# Patient Record
Sex: Male | Born: 1950 | Race: White | Hispanic: No | Smoking: Former smoker
Health system: Southern US, Community
[De-identification: ages and names within clinical notes are randomized; demographics above are authoritative.]

## PROBLEM LIST (undated history)

## (undated) DIAGNOSIS — E079 Disorder of thyroid, unspecified: Secondary | ICD-10-CM

## (undated) DIAGNOSIS — L409 Psoriasis, unspecified: Secondary | ICD-10-CM

## (undated) DIAGNOSIS — I639 Cerebral infarction, unspecified: Secondary | ICD-10-CM

## (undated) DIAGNOSIS — J690 Pneumonitis due to inhalation of food and vomit: Secondary | ICD-10-CM

## (undated) DIAGNOSIS — E7800 Pure hypercholesterolemia, unspecified: Secondary | ICD-10-CM

## (undated) DIAGNOSIS — R1310 Dysphagia, unspecified: Secondary | ICD-10-CM

## (undated) DIAGNOSIS — S065XAA Traumatic subdural hemorrhage with loss of consciousness status unknown, initial encounter: Secondary | ICD-10-CM

## (undated) DIAGNOSIS — F319 Bipolar disorder, unspecified: Secondary | ICD-10-CM

## (undated) DIAGNOSIS — K5900 Constipation, unspecified: Secondary | ICD-10-CM

## (undated) DIAGNOSIS — S065X9A Traumatic subdural hemorrhage with loss of consciousness of unspecified duration, initial encounter: Secondary | ICD-10-CM

## (undated) HISTORY — DX: Constipation, unspecified: K59.00

## (undated) HISTORY — PX: WRIST FRACTURE SURGERY: SHX121

## (undated) HISTORY — DX: Psoriasis, unspecified: L40.9

## (undated) HISTORY — DX: Pure hypercholesterolemia, unspecified: E78.00

---

## 2003-08-23 HISTORY — PX: FOOT SURGERY: SHX648

## 2011-08-27 ENCOUNTER — Emergency Department (HOSPITAL_COMMUNITY)
Admission: EM | Admit: 2011-08-27 | Discharge: 2011-08-27 | Disposition: A | Discharge status: Home / Self Care | Payer: Medicaid Other | Source: Home / Self Care | Attending: Emergency Medicine | Admitting: Emergency Medicine

## 2011-08-27 ENCOUNTER — Encounter (HOSPITAL_COMMUNITY): Payer: Self-pay | Admitting: *Deleted

## 2011-08-27 DIAGNOSIS — H109 Unspecified conjunctivitis: Secondary | ICD-10-CM

## 2011-08-27 MED ORDER — CIPROFLOXACIN HCL 0.3 % OP SOLN: OPHTHALMIC | Status: DC

## 2011-08-27 NOTE — ED Provider Notes (Signed)
Chief Complaint  Patient presents with  . Eye Problem    History of Present Illness:   Ricky Ward is a 61 year old male who has a history of a red, draining the right eye for the past 4 days. There is no history of injury to the eye and he does not think he got anything in the eye. The eye is somewhat irritated feeling, a little itchy, but not painful, and these had no photophobia or no vision change. He had some yellowish drainage. There is also some swelling of his upper lid. He denies any fever, chills, sore throat, adenopathy, or cough.  Review of Systems:  Other than noted above, the patient denies any of the following symptoms: Systemic:  No fever, chills, sweats, fatigue, or weight loss. Eye:  No redness, eye pain, photophobia, discharge, blurred vision, or diplopia. ENT:  No nasal congestion, rhinorrhea, or sore throat. Lymphatic:  No adenopathy. Skin:  No rash or pruritis.  PMFSH:  Past medical history, family history, social history, meds, and allergies were reviewed.  Physical Exam:   Vital signs:  BP 135/80  Pulse 78  Temp 98.6 F (37 C) (Oral)  Resp 16  SpO2 100% General:  Alert and in no distress. Eye:  His upper and lower eyelid of his right eye was slightly swollen and erythematous. Periorbital tissues are normal. The conjunctiva was red and injected and there was some mucoid drainage. There was no evidence of foreign body. The cornea was intact to fluorescein staining, anterior chamber was normal, PERRLA, full visual fields, full EOMs. Fundi were not well seen because of small and poorly reactive pupils. ENT:  TMs and canals clear.  Nasal mucosa normal.  No intra-oral lesions, mucous membranes moist, pharynx clear. Neck:  No adenopathy tenderness or mass. Skin:  Clear, warm and dry.  Assessment:  The encounter diagnosis was Conjunctivitis.  Plan:   1.  The following meds were prescribed:   New Prescriptions   CIPROFLOXACIN (CILOXAN) 0.3 % OPHTHALMIC SOLUTION    1 drop in  right eye every 3 hours while awake.   2.  The patient was instructed in symptomatic care and handouts were given. 3.  The patient was told to return if becoming worse in any way, if no better in 3 or 4 days, and given some red flag symptoms that would indicate earlier return.     Reuben Likes, MD 08/27/11 [phone]

## 2011-08-27 NOTE — ED Notes (Signed)
Pt  R  Eye  Is  Red  Irritated  And  Watering       X  Several  Days       She      Reports  Poss  fb  In the  Affected  Eye  -  Pt  Is  Quite  Animated  And  Vocal  -        He  denys  Any  Other  Symptoms  At this  Time

## 2013-04-15 ENCOUNTER — Encounter (HOSPITAL_COMMUNITY): Payer: Self-pay | Admitting: Emergency Medicine

## 2013-04-15 ENCOUNTER — Emergency Department (HOSPITAL_COMMUNITY)
Admission: EM | Admit: 2013-04-15 | Discharge: 2013-04-16 | Disposition: A | Discharge status: Home / Self Care | Payer: Medicaid Other | Attending: Emergency Medicine | Admitting: Emergency Medicine

## 2013-04-15 DIAGNOSIS — F29 Unspecified psychosis not due to a substance or known physiological condition: Secondary | ICD-10-CM | POA: Insufficient documentation

## 2013-04-15 DIAGNOSIS — Z008 Encounter for other general examination: Secondary | ICD-10-CM | POA: Diagnosis present

## 2013-04-15 DIAGNOSIS — R221 Localized swelling, mass and lump, neck: Secondary | ICD-10-CM

## 2013-04-15 DIAGNOSIS — Z880 Allergy status to penicillin: Secondary | ICD-10-CM | POA: Insufficient documentation

## 2013-04-15 DIAGNOSIS — R220 Localized swelling, mass and lump, head: Secondary | ICD-10-CM | POA: Diagnosis not present

## 2013-04-15 HISTORY — DX: Bipolar disorder, unspecified: F31.9

## 2013-04-15 LAB — CBC WITH DIFFERENTIAL/PLATELET
BASOS ABS: 0.1 10*3/uL (ref 0.0–0.1)
Basophils Relative: 1 % (ref 0–1)
EOS ABS: 0.2 10*3/uL (ref 0.0–0.7)
EOS PCT: 3 % (ref 0–5)
HCT: 37.9 % — ABNORMAL LOW (ref 39.0–52.0)
Hemoglobin: 13.1 g/dL (ref 13.0–17.0)
LYMPHS ABS: 1.4 10*3/uL (ref 0.7–4.0)
Lymphocytes Relative: 24 % (ref 12–46)
MCH: 32.7 pg (ref 26.0–34.0)
MCHC: 34.6 g/dL (ref 30.0–36.0)
MCV: 94.5 fL (ref 78.0–100.0)
Monocytes Absolute: 0.5 10*3/uL (ref 0.1–1.0)
Monocytes Relative: 8 % (ref 3–12)
NEUTROS PCT: 64 % (ref 43–77)
Neutro Abs: 3.8 10*3/uL (ref 1.7–7.7)
PLATELETS: 256 10*3/uL (ref 150–400)
RBC: 4.01 MIL/uL — AB (ref 4.22–5.81)
RDW: 13.7 % (ref 11.5–15.5)
WBC: 5.9 10*3/uL (ref 4.0–10.5)

## 2013-04-15 LAB — COMPREHENSIVE METABOLIC PANEL
ALBUMIN: 4.5 g/dL (ref 3.5–5.2)
ALK PHOS: 58 U/L (ref 39–117)
ALT: 45 U/L (ref 0–53)
AST: 65 U/L — AB (ref 0–37)
BUN: 11 mg/dL (ref 6–23)
CALCIUM: 9.7 mg/dL (ref 8.4–10.5)
CO2: 28 mEq/L (ref 19–32)
Chloride: 98 mEq/L (ref 96–112)
Creatinine, Ser: 1.01 mg/dL (ref 0.50–1.35)
GFR calc Af Amer: 89 mL/min — ABNORMAL LOW (ref >90)
GFR calc non Af Amer: 77 mL/min — ABNORMAL LOW (ref >90)
GLUCOSE: 127 mg/dL — AB (ref 70–99)
POTASSIUM: 3.9 meq/L (ref 3.7–5.3)
SODIUM: 139 meq/L (ref 137–147)
TOTAL PROTEIN: 8.7 g/dL — AB (ref 6.0–8.3)
Total Bilirubin: 0.5 mg/dL (ref 0.3–1.2)

## 2013-04-15 LAB — RAPID URINE DRUG SCREEN, HOSP PERFORMED
AMPHETAMINES: NOT DETECTED
BARBITURATES: NOT DETECTED
BENZODIAZEPINES: NOT DETECTED
COCAINE: NOT DETECTED
Opiates: NOT DETECTED
TETRAHYDROCANNABINOL: NOT DETECTED

## 2013-04-15 LAB — URINALYSIS, ROUTINE W REFLEX MICROSCOPIC
BILIRUBIN URINE: NEGATIVE
Glucose, UA: NEGATIVE mg/dL
HGB URINE DIPSTICK: NEGATIVE
KETONES UR: NEGATIVE mg/dL
Leukocytes, UA: NEGATIVE
NITRITE: NEGATIVE
PROTEIN: NEGATIVE mg/dL
SPECIFIC GRAVITY, URINE: 1.015 (ref 1.005–1.030)
UROBILINOGEN UA: 1 mg/dL (ref 0.0–1.0)
pH: 7.5 (ref 5.0–8.0)

## 2013-04-15 LAB — AMMONIA: AMMONIA: 16 umol/L (ref 11–60)

## 2013-04-15 LAB — ETHANOL: Alcohol, Ethyl (B): <11 mg/dL (ref 0–11)

## 2013-04-15 NOTE — ED Notes (Signed)
Pt arrives from [place] w/ security guard; pt was brought to [facility] after calling [employer] this morning to express suicidal thoughts, pt desired to have officer shoot him as his SI plan; pt is int he ER for medical clearance and may return to [facility] after medical screening and evaluation of bump to face; pt with large "bump" to rt side of face; pt states that he has had that for 2+ yrs

## 2013-04-15 NOTE — ED Provider Notes (Signed)
CSN: [id number]     Arrival date & time 04/15/13  2023 History  This chart was scribed for non-physician practitioner, Charlann Lange, PA-C,working with Richarda Blade, MD, by Marlowe Kays, ED Scribe.  This patient was seen in room WTR3/WLPT3 and the patient's care was started at 8:58 PM.  No chief complaint on file.  The history is provided by the patient. No language interpreter was used.   HPI Comments:  Ricky Ward is a 63 y.o. male who presents to the Emergency Department needing a medical clearance. Pt states he is here from [facility] for "testing". Pt states he has been eating and drinking normally. Pt denies any pain.   No past medical history on file. Past Surgical History  Procedure Laterality Date  . Foot surgery     No family history on file. History  Substance Use Topics  . Smoking status: Not on file  . Smokeless tobacco: Not on file  . Alcohol Use:     Review of Systems  All other systems reviewed and are negative.   Allergies  Penicillins  Home Medications  No current outpatient prescriptions on file. Triage Vitals: BP 140/77  Pulse 81  Temp(Src) 98.1 F (36.7 C) (Oral)  Resp 18  SpO2 100% Physical Exam  Nursing note and vitals reviewed. Constitutional: He is oriented to person, place, and time. He appears well-developed and well-nourished.  HENT:  Head: Normocephalic and atraumatic.  Right sided facial swelling over mandibular condyle. Appears nontender and chronic.   Eyes: EOM are normal.  Neck: Normal range of motion.  Cardiovascular: Normal rate and regular rhythm.  Exam reveals no gallop and no friction rub.   No murmur heard. Pulmonary/Chest: Effort normal and breath sounds normal. No respiratory distress. He has no wheezes. He has no rales. He exhibits no tenderness.  Abdominal: Soft. He exhibits no distension and no mass. There is no tenderness. There is no rebound and no guarding.  Musculoskeletal: Normal range of motion.  Neurological: He  is alert and oriented to person, place, and time.  Skin: Skin is warm and dry.  Psychiatric: He has a normal mood and affect. His behavior is normal.    ED Course  Procedures (including critical care time) DIAGNOSTIC STUDIES: Oxygen Saturation is 100% on RA, normal by my interpretation.   COORDINATION OF CARE: 9:03 PM- Will order standard medical clearance labs. Pt verbalizes understanding and agrees to plan.  Medications - No data to display  Labs Review Labs Reviewed  CBC WITH DIFFERENTIAL - Abnormal; Notable for the following:    RBC 4.01 (*)    HCT 37.9 (*)    All other components within normal limits  COMPREHENSIVE METABOLIC PANEL - Abnormal; Notable for the following:    Glucose, Bld 127 (*)    Total Protein 8.7 (*)    AST 65 (*)    GFR calc non Af Amer 77 (*)    GFR calc Af Amer 89 (*)    All other components within normal limits  ETHANOL  URINE RAPID DRUG SCREEN (HOSP PERFORMED)  AMMONIA  URINALYSIS, ROUTINE W REFLEX MICROSCOPIC   Results for orders placed during the hospital encounter of 04/15/13  CBC WITH DIFFERENTIAL      Result Value Ref Range   WBC 5.9  4.0 - 10.5 K/uL   RBC 4.01 (*) 4.22 - 5.81 MIL/uL   Hemoglobin 13.1  13.0 - 17.0 g/dL   HCT 37.9 (*) 39.0 - 52.0 %   MCV 94.5  78.0 -  100.0 fL   MCH 32.7  26.0 - 34.0 pg   MCHC 34.6  30.0 - 36.0 g/dL   RDW 13.7  11.5 - 15.5 %   Platelets 256  150 - 400 K/uL   Neutrophils Relative % 64  43 - 77 %   Neutro Abs 3.8  1.7 - 7.7 K/uL   Lymphocytes Relative 24  12 - 46 %   Lymphs Abs 1.4  0.7 - 4.0 K/uL   Monocytes Relative 8  3 - 12 %   Monocytes Absolute 0.5  0.1 - 1.0 K/uL   Eosinophils Relative 3  0 - 5 %   Eosinophils Absolute 0.2  0.0 - 0.7 K/uL   Basophils Relative 1  0 - 1 %   Basophils Absolute 0.1  0.0 - 0.1 K/uL  COMPREHENSIVE METABOLIC PANEL      Result Value Ref Range   Sodium 139  137 - 147 mEq/L   Potassium 3.9  3.7 - 5.3 mEq/L   Chloride 98  96 - 112 mEq/L   CO2 28  19 - 32 mEq/L    Glucose, Bld 127 (*) 70 - 99 mg/dL   BUN 11  6 - 23 mg/dL   Creatinine, Ser 1.01  0.50 - 1.35 mg/dL   Calcium 9.7  8.4 - 10.5 mg/dL   Total Protein 8.7 (*) 6.0 - 8.3 g/dL   Albumin 4.5  3.5 - 5.2 g/dL   AST 65 (*) 0 - 37 U/L   ALT 45  0 - 53 U/L   Alkaline Phosphatase 58  39 - 117 U/L   Total Bilirubin 0.5  0.3 - 1.2 mg/dL   GFR calc non Af Amer 77 (*) >90 mL/min   GFR calc Af Amer 89 (*) >90 mL/min  ETHANOL      Result Value Ref Range   Alcohol, Ethyl (B) <11  0 - 11 mg/dL  URINE RAPID DRUG SCREEN (HOSP PERFORMED)      Result Value Ref Range   Opiates NONE DETECTED  NONE DETECTED   Cocaine NONE DETECTED  NONE DETECTED   Benzodiazepines NONE DETECTED  NONE DETECTED   Amphetamines NONE DETECTED  NONE DETECTED   Tetrahydrocannabinol NONE DETECTED  NONE DETECTED   Barbiturates NONE DETECTED  NONE DETECTED  AMMONIA      Result Value Ref Range   Ammonia 16  11 - 60 umol/L  URINALYSIS, ROUTINE W REFLEX MICROSCOPIC      Result Value Ref Range   Color, Urine YELLOW  YELLOW   APPearance CLEAR  CLEAR   Specific Gravity, Urine 1.015  1.005 - 1.030   pH 7.5  5.0 - 8.0   Glucose, UA NEGATIVE  NEGATIVE mg/dL   Hgb urine dipstick NEGATIVE  NEGATIVE   Bilirubin Urine NEGATIVE  NEGATIVE   Ketones, ur NEGATIVE  NEGATIVE mg/dL   Protein, ur NEGATIVE  NEGATIVE mg/dL   Urobilinogen, UA 1.0  0.0 - 1.0 mg/dL   Nitrite NEGATIVE  NEGATIVE   Leukocytes, UA NEGATIVE  NEGATIVE   Dg Chest 2 View  04/16/2013   CLINICAL DATA:  Medical clearance.  Nonsmoker.  EXAM: CHEST  2 VIEW  COMPARISON:  None.  FINDINGS: No infiltrate, congestive heart failure or pneumothorax. Cardiac leads overlie the left lower lung.  Chronic increased lung markings suspected.  Heart size within normal limits.  Calcified aorta.  IMPRESSION: No acute abnormality.  Please see above.   Electronically Signed   By: Chauncey Cruel M.D.   On:  04/16/2013 01:52   Imaging Review No results found.   EKG Interpretation None      MDM    Final diagnoses:  None    1. Psychosis  The patient has been cooperative here, hyperverbal. Medically cleared and accepted for return to [facility] for further treatment.   I personally performed the services described in this documentation, which was scribed in my presence. The recorded information has been reviewed and is accurate.    Dewaine Oats, PA-C 04/16/13 [phone]

## 2013-04-16 ENCOUNTER — Emergency Department (HOSPITAL_COMMUNITY): Payer: Medicaid Other

## 2013-04-16 NOTE — Discharge Instructions (Signed)
Psychosis Psychosis refers to a severe lack of understanding with reality. During a psychotic episode, you are not able to think clearly. During a psychotic episode, your responses and emotions are inappropriate and do not coincide with what is actually happening. You often have false beliefs about what is happening or who you are (delusions), and you may see, hear, taste, smell, or feel things that are not present (hallucinations). Psychosis is usually a severe symptom of a very serious mental health (psychiatric) condition, but it can sometimes be the result of a medical condition. CAUSES   Psychiatric conditions, such as:  Schizophrenia.  Bipolar disorder.  Depression.  Personality disorders.  Alcohol or drug abuse.  Medical conditions, such as:  Brain injury.  Brain tumor.  Dementia.  Brain diseases, such as Alzheimer's, Parkinson's, or Huntington's disease.  Neurological diseases, such as epilepsy.  Genetic disorders.  Metabolic disorders.  Infections that affect the brain.  Certain prescription drugs.  Stroke. SYMPTOMS   Unable to think or speak clearly or respond appropriately.  Disorganized thinking (thoughts jump from one thought to another).  Severe inappropriate behavior.  Delusions may include:  A strong belief that is odd, unrealistic, or false.  Feeling extremely fearful or suspicious (paranoid).  Believing you are someone else, have high importance, or have an altered identity.  Hallucinations. DIAGNOSIS   Mental health evaluation.  Physical exam.  Blood tests.  Computerized magnetic scan (MRI) or other brain scans. TREATMENT  Your caregiver will recommend a course of treatment that depends on the cause of the psychosis. Treatment may include:  Monitoring and supportive care in the hospital.  Taking medicines (antipsychotic medicine) to reduce symptoms and balance chemicals in the brain.  Taking medicines to manage underlying  mental health conditions.  Therapy and other supportive programs outside of the hospital.  Treating an underlying medical condition. If the cause of the psychosis can be treated or corrected, the outlook is good. Without treatment, psychotic episodes can cause danger to yourself or others. Treatment may be short-term or lifelong. HOME CARE INSTRUCTIONS   Take all medicines as directed. This is important.  Use a pillbox or write down your medicine schedule to make sure you are taking them.  Check with your caregiver before using over-the-counter medicines, herbs, or supplements.  Seek individual and family support through therapy and mental health education (psychoeducation) programs. These will help you manage symptoms and side effects of medicines, learn life skills, and maintain a healthy routine.  Maintain a healthy lifestyle.  Exercise regularly.  Avoid alcohol and drugs.  Learn ways to reduce stress and cope with stress, such as yoga and meditation.  Talk about your feelings with family members or caregivers.  Make time for yourself to do things you enjoy.  Know the early warning signs of psychosis. Your caregiver will recommend steps to take when you notice symptoms such as:  Feeling anxious or preoccupied.  Having racing thoughts.  Changes in your interest in life and relationships.  Follow up with your caregivers for continued outpatient treatment as directed. SEEK MEDICAL CARE IF:   Medicines do not seem to be helping.  You hear voices telling you to do things.  You see, smell, or feel things that are not there.  You feel hopeless and overwhelmed.  You feel extremely fearful and suspicious that something will harm you.  You feel like you cannot leave your house.  You have trouble taking care of yourself.  You experience side effects of medicines, such as  changes in sleep patterns, dizziness, weight gain, restlessness, movement changes, muscle spasms, or  tremors. °SEEK IMMEDIATE MEDICAL CARE IF:  °Severe psychotic symptoms present a safety issue (such as an urge to hurt yourself or others). °MAKE SURE YOU:  °· Understand these instructions. °· Will watch your condition. °· Will get help right away if you are not doing well or get worse. °FOR MORE INFORMATION  °National Institute of Mental Health: www.nimh.nih.gov °Document Released: 07/19/2009 Document Revised: 04/23/2011 Document Reviewed: 07/19/2009 °Exit[employer]® Patient Information ©2014 [employer]. ° °

## 2013-04-16 NOTE — ED Notes (Signed)
Spoke with [place], Ok to send pt back

## 2013-04-16 NOTE — ED Provider Notes (Signed)
Medical screening examination/treatment/procedure(s) were performed by non-physician practitioner and as supervising physician I was immediately available for consultation/collaboration.    Teressa Lower, MD 04/16/13 [phone]

## 2014-01-05 ENCOUNTER — Encounter (HOSPITAL_COMMUNITY): Payer: Self-pay | Admitting: Emergency Medicine

## 2014-01-05 ENCOUNTER — Emergency Department (INDEPENDENT_AMBULATORY_CARE_PROVIDER_SITE_OTHER)
Admission: EM | Admit: 2014-01-05 | Discharge: 2014-01-05 | Disposition: A | Discharge status: Home / Self Care | Payer: Medicare Other | Source: Home / Self Care | Attending: Family Medicine | Admitting: Family Medicine

## 2014-01-05 DIAGNOSIS — L723 Sebaceous cyst: Secondary | ICD-10-CM | POA: Diagnosis not present

## 2014-01-05 NOTE — ED Notes (Signed)
Reports possible abscess left side of face.  States "it's been there a couple of years".   Noticed pus draining on Saturday.

## 2014-01-05 NOTE — ED Provider Notes (Signed)
CSN: [id number]     Arrival date & time 01/05/14  1200 History   First MD Initiated Contact with Patient 01/05/14 1309     Chief Complaint  Patient presents with  . Facial Swelling    left side of face ? abscess   (Consider location/radiation/quality/duration/timing/severity/associated sxs/prior Treatment) Patient is a 63 y.o. male presenting with rash. The history is provided by the patient.  Rash Location:  Face Facial rash location:  L cheek Quality: draining and swelling   Severity:  Moderate Onset quality:  Gradual Duration: lesion present for 70yrs, drainage started on sat. Progression:  Worsening Chronicity:  Chronic Relieved by:  None tried Ineffective treatments:  None tried Associated symptoms: no fever     Past Medical History  Diagnosis Date  . Bipolar 1 disorder    Past Surgical History  Procedure Laterality Date  . Foot surgery     History reviewed. No pertinent family history. History  Substance Use Topics  . Smoking status: Never Smoker   . Smokeless tobacco: Not on file  . Alcohol Use: No    Review of Systems  Constitutional: Negative.  Negative for fever.  Musculoskeletal: Negative.   Skin: Positive for rash. Negative for wound.    Allergies  Penicillins  Home Medications   Prior to Admission medications   Medication Sig Start Date End Date Taking? Authorizing Provider  LEVOTHYROXINE SODIUM PO Take by mouth.   Yes Historical Provider, MD   BP 128/77 mmHg  Pulse 97  Temp(Src) 99 F (37.2 C) (Oral)  Resp 16  SpO2 97% Physical Exam  Constitutional: He is oriented to person, place, and time. He appears well-developed and well-nourished. No distress.  HENT:  Head: Normocephalic.  Right Ear: External ear normal.  Left Ear: External ear normal.  Mouth/Throat: Oropharynx is clear and moist.  Neck: Normal range of motion. Neck supple.  Lymphadenopathy:    He has no cervical adenopathy.  Neurological: He is alert and oriented to person,  place, and time.  Skin: Skin is warm and dry. No erythema.  2cm seb cyst to left cheek.  Nursing note and vitals reviewed.   ED Course  INCISION AND DRAINAGE Date/Time: 01/05/2014 2:08 PM Performed by: Ihor Gully D Authorized by: Ihor Gully D Consent: Verbal consent obtained. Risks and benefits: risks, benefits and alternatives were discussed Consent given by: patient Type: cyst Body area: head/neck Location details: face Local anesthetic: topical anesthetic Patient sedated: no Scalpel size: 15 Incision type: single straight Complexity: simple Drainage characteristics: thick sebaceous fluid expressed. Drainage amount: copious Wound treatment: wound left open Patient tolerance: Patient tolerated the procedure well with no immediate complications   (including critical care time) Labs Review Labs Reviewed - No data to display  Imaging Review No results found.   MDM   1. Inflamed sebaceous cyst        Billy Fischer, MD 01/05/14 1410

## 2014-01-05 NOTE — Discharge Instructions (Signed)
Bandage as needed, warm cloth twice a day, return if needed.

## 2014-02-23 DIAGNOSIS — R5382 Chronic fatigue, unspecified: Secondary | ICD-10-CM | POA: Diagnosis not present

## 2014-02-23 DIAGNOSIS — E039 Hypothyroidism, unspecified: Secondary | ICD-10-CM | POA: Diagnosis not present

## 2014-02-23 DIAGNOSIS — Z0000 Encounter for general adult medical examination without abnormal findings: Secondary | ICD-10-CM | POA: Diagnosis not present

## 2014-03-09 DIAGNOSIS — L03116 Cellulitis of left lower limb: Secondary | ICD-10-CM | POA: Diagnosis not present

## 2014-03-09 DIAGNOSIS — E039 Hypothyroidism, unspecified: Secondary | ICD-10-CM | POA: Diagnosis not present

## 2014-03-09 DIAGNOSIS — R600 Localized edema: Secondary | ICD-10-CM | POA: Diagnosis not present

## 2014-03-09 DIAGNOSIS — R5382 Chronic fatigue, unspecified: Secondary | ICD-10-CM | POA: Diagnosis not present

## 2014-03-20 ENCOUNTER — Emergency Department (INDEPENDENT_AMBULATORY_CARE_PROVIDER_SITE_OTHER)
Admission: EM | Admit: 2014-03-20 | Discharge: 2014-03-20 | Disposition: A | Discharge status: Home / Self Care | Payer: Medicare Other | Source: Home / Self Care | Attending: Emergency Medicine | Admitting: Emergency Medicine

## 2014-03-20 ENCOUNTER — Encounter (HOSPITAL_COMMUNITY): Payer: Self-pay | Admitting: *Deleted

## 2014-03-20 DIAGNOSIS — G4700 Insomnia, unspecified: Secondary | ICD-10-CM

## 2014-03-20 DIAGNOSIS — E038 Other specified hypothyroidism: Secondary | ICD-10-CM

## 2014-03-20 MED ORDER — ZOLPIDEM TARTRATE 5 MG PO TABS: 5.0000 mg | ORAL_TABLET | Freq: Every evening | ORAL | Status: DC | PRN

## 2014-03-20 NOTE — ED Notes (Signed)
C/o insomnia since 12/21.  Dr. Hanley Seamen him Trazadone, 1/12, Restoril 1/26 and 1/28 Melatonin ( but stopped this because it was upsetting his stomach.  Dozes off @ 0400 for and hour.

## 2014-03-20 NOTE — ED Provider Notes (Signed)
CSN: [id number]     Arrival date & time 03/20/14  1320 History   First MD Initiated Contact with Patient 03/20/14 1356     Chief Complaint  Patient presents with  . Insomnia   (Consider location/radiation/quality/duration/timing/severity/associated sxs/prior Treatment) HPI He is a 64 year old man here for evaluation of insomnia. He states he has been unable to sleep for more than an hour a night since December 21. He primarily reports difficulty falling asleep. He states he goes to bed between 10 PM and midnight. He does not watch television before bed. He does not take naps during the day. His doctor has tried him on Benadryl, trazodone, temazepam, melatonin. At this time, he is only taking the temazepam. He is also on levothyroxin. His dose was changed from 100 g to 75 g about 2 weeks ago.  Past Medical History  Diagnosis Date  . Bipolar 1 disorder    Past Surgical History  Procedure Laterality Date  . Foot surgery  08/23/2003   History reviewed. No pertinent family history. History  Substance Use Topics  . Smoking status: Never Smoker   . Smokeless tobacco: Not on file  . Alcohol Use: No    Review of Systems As in history of present illness Allergies  Penicillins  Home Medications   Prior to Admission medications   Medication Sig Start Date End Date Taking? Authorizing Provider  LEVOTHYROXINE SODIUM PO Take 0.075 mg by mouth daily.    Yes Historical Provider, MD  temazepam (RESTORIL) 7.5 MG capsule Take 7.5 mg by mouth at bedtime as needed for sleep. Not sure of dose.   Yes Historical Provider, MD  traZODone (DESYREL) 100 MG tablet Take 100 mg by mouth at bedtime. Not sure of the dose   Yes Historical Provider, MD  zolpidem (AMBIEN) 5 MG tablet Take 1 tablet (5 mg total) by mouth at bedtime as needed for sleep. 03/20/14   Melony Overly, MD   BP 132/80 mmHg  Pulse 75  Temp(Src) 97.9 F (36.6 C) (Oral)  Resp 18  SpO2 98% Physical Exam  Constitutional: He is oriented to  person, place, and time. He appears well-developed and well-nourished. No distress.  Cardiovascular: Normal rate.   Pulmonary/Chest: Effort normal.  Neurological: He is alert and oriented to person, place, and time.    ED Course  Procedures (including critical care time) Labs Review Labs Reviewed - No data to display  Imaging Review No results found.   MDM   1. Insomnia   2. Other specified hypothyroidism    Instructed him to stop the temazepam. We'll do a trial of Ambien. Instructed him to follow-up with his doctor next week. He should ask about his thyroid levels to make sure his thyroid medication is not contributing to his insomnia.    Melony Overly, MD 03/20/14 1435

## 2014-03-20 NOTE — Discharge Instructions (Signed)
Stopped taking the Restoril or temazepam. Start taking Ambien at bedtime. Follow-up with your doctor next week to see if the medication is helping. When you see your doctor, asked them about your thyroid levels.  If the levels are not normal, it can interrupt your sleep.

## 2014-03-27 ENCOUNTER — Emergency Department (INDEPENDENT_AMBULATORY_CARE_PROVIDER_SITE_OTHER)
Admission: EM | Admit: 2014-03-27 | Discharge: 2014-03-27 | Disposition: A | Discharge status: Home / Self Care | Payer: Medicare Other | Source: Home / Self Care | Attending: Family Medicine | Admitting: Family Medicine

## 2014-03-27 ENCOUNTER — Encounter (HOSPITAL_COMMUNITY): Payer: Self-pay

## 2014-03-27 DIAGNOSIS — B372 Candidiasis of skin and nail: Secondary | ICD-10-CM | POA: Diagnosis not present

## 2014-03-27 MED ORDER — CLOTRIMAZOLE-BETAMETHASONE 1-0.05 % EX CREA: TOPICAL_CREAM | CUTANEOUS | Status: DC

## 2014-03-27 NOTE — ED Provider Notes (Signed)
CSN: [id number]     Arrival date & time 03/27/14  1255 History   First MD Initiated Contact with Patient 03/27/14 1426     Chief Complaint  Patient presents with  . Skin Problem   (Consider location/radiation/quality/duration/timing/severity/associated sxs/prior Treatment) Patient is a 64 y.o. male presenting with rash. The history is provided by the patient.  Rash Location:  Mouth and ano-genital Mouth rash location:  Lower outer lip Ano-genital rash location:  Penis Quality: itchiness, peeling and redness   Severity:  Mild Duration:  6 hours Progression:  Unchanged Chronicity:  New Relieved by:  None tried Worsened by:  Nothing tried Ineffective treatments:  None tried Associated symptoms: no fever     Past Medical History  Diagnosis Date  . Bipolar 1 disorder    Past Surgical History  Procedure Laterality Date  . Foot surgery  08/23/2003   History reviewed. No pertinent family history. History  Substance Use Topics  . Smoking status: Never Smoker   . Smokeless tobacco: Not on file  . Alcohol Use: No    Review of Systems  Constitutional: Negative.  Negative for fever.  Genitourinary: Negative for discharge, penile swelling, penile pain and testicular pain.  Skin: Positive for rash.    Allergies  Penicillins  Home Medications   Prior to Admission medications   Medication Sig Start Date End Date Taking? Authorizing Provider  LEVOTHYROXINE SODIUM PO Take 0.075 mg by mouth daily.    Yes Historical Provider, MD  zolpidem (AMBIEN) 5 MG tablet Take 1 tablet (5 mg total) by mouth at bedtime as needed for sleep. 03/20/14  Yes Melony Overly, MD  clotrimazole-betamethasone (LOTRISONE) cream Apply to affected area 2 times daily prn 03/27/14   Billy Fischer, MD  temazepam (RESTORIL) 7.5 MG capsule Take 7.5 mg by mouth at bedtime as needed for sleep. Not sure of dose.    Historical Provider, MD  traZODone (DESYREL) 100 MG tablet Take 100 mg by mouth at bedtime. Not sure of the  dose    Historical Provider, MD   BP 137/65 mmHg  Pulse 62  Temp(Src) 97.9 F (36.6 C) (Oral)  SpO2 99% Physical Exam  Constitutional: He is oriented to person, place, and time. He appears well-developed and well-nourished. No distress.  HENT:  Lower lip dermatitis  Neck: Normal range of motion. Neck supple.  Genitourinary:  Rash under foreskin on glans, dry erythema.  Lymphadenopathy:    He has no cervical adenopathy.  Neurological: He is alert and oriented to person, place, and time.  Skin: Skin is warm and dry. Rash noted.  Nursing note and vitals reviewed.   ED Course  Procedures (including critical care time) Labs Review Labs Reviewed - No data to display  Imaging Review No results found.   MDM   1. Candidal dermatitis        Billy Fischer, MD 03/27/14 [phone]

## 2014-03-27 NOTE — Discharge Instructions (Signed)
Use medicine twice a day, return as needed

## 2014-03-27 NOTE — ED Notes (Signed)
Reports he has a problem w a blister on his lip since he woke this AM

## 2014-03-31 DIAGNOSIS — R5383 Other fatigue: Secondary | ICD-10-CM | POA: Diagnosis not present

## 2014-03-31 DIAGNOSIS — E039 Hypothyroidism, unspecified: Secondary | ICD-10-CM | POA: Diagnosis not present

## 2014-03-31 DIAGNOSIS — M545 Low back pain: Secondary | ICD-10-CM | POA: Diagnosis not present

## 2014-03-31 DIAGNOSIS — G4701 Insomnia due to medical condition: Secondary | ICD-10-CM | POA: Diagnosis not present

## 2014-03-31 DIAGNOSIS — E780 Pure hypercholesterolemia: Secondary | ICD-10-CM | POA: Diagnosis not present

## 2014-04-01 DIAGNOSIS — J449 Chronic obstructive pulmonary disease, unspecified: Secondary | ICD-10-CM | POA: Diagnosis not present

## 2014-04-19 DIAGNOSIS — B002 Herpesviral gingivostomatitis and pharyngotonsillitis: Secondary | ICD-10-CM | POA: Diagnosis not present

## 2014-04-19 DIAGNOSIS — G4700 Insomnia, unspecified: Secondary | ICD-10-CM | POA: Diagnosis not present

## 2014-04-19 DIAGNOSIS — B3742 Candidal balanitis: Secondary | ICD-10-CM | POA: Diagnosis not present

## 2014-04-30 DIAGNOSIS — G4700 Insomnia, unspecified: Secondary | ICD-10-CM | POA: Diagnosis not present

## 2014-04-30 DIAGNOSIS — B002 Herpesviral gingivostomatitis and pharyngotonsillitis: Secondary | ICD-10-CM | POA: Diagnosis not present

## 2014-04-30 DIAGNOSIS — R5382 Chronic fatigue, unspecified: Secondary | ICD-10-CM | POA: Diagnosis not present

## 2014-04-30 DIAGNOSIS — E039 Hypothyroidism, unspecified: Secondary | ICD-10-CM | POA: Diagnosis not present

## 2014-05-14 DIAGNOSIS — G4700 Insomnia, unspecified: Secondary | ICD-10-CM | POA: Diagnosis not present

## 2014-05-14 DIAGNOSIS — B7781 Ascariasis pneumonia: Secondary | ICD-10-CM | POA: Diagnosis not present

## 2014-05-14 DIAGNOSIS — E039 Hypothyroidism, unspecified: Secondary | ICD-10-CM | POA: Diagnosis not present

## 2014-05-20 DIAGNOSIS — G4700 Insomnia, unspecified: Secondary | ICD-10-CM | POA: Diagnosis not present

## 2014-05-20 DIAGNOSIS — R5382 Chronic fatigue, unspecified: Secondary | ICD-10-CM | POA: Diagnosis not present

## 2014-05-20 DIAGNOSIS — B002 Herpesviral gingivostomatitis and pharyngotonsillitis: Secondary | ICD-10-CM | POA: Diagnosis not present

## 2014-05-20 DIAGNOSIS — E039 Hypothyroidism, unspecified: Secondary | ICD-10-CM | POA: Diagnosis not present

## 2014-05-25 ENCOUNTER — Emergency Department (HOSPITAL_COMMUNITY): Payer: Medicare Other

## 2014-05-25 ENCOUNTER — Emergency Department (HOSPITAL_COMMUNITY)
Admission: EM | Admit: 2014-05-25 | Discharge: 2014-05-26 | Disposition: A | Discharge status: Home / Self Care | Payer: Medicare Other | Attending: Emergency Medicine | Admitting: Emergency Medicine

## 2014-05-25 ENCOUNTER — Encounter (HOSPITAL_COMMUNITY): Payer: Self-pay

## 2014-05-25 DIAGNOSIS — F1310 Sedative, hypnotic or anxiolytic abuse, uncomplicated: Secondary | ICD-10-CM | POA: Insufficient documentation

## 2014-05-25 DIAGNOSIS — Z79899 Other long term (current) drug therapy: Secondary | ICD-10-CM | POA: Insufficient documentation

## 2014-05-25 DIAGNOSIS — F319 Bipolar disorder, unspecified: Secondary | ICD-10-CM | POA: Insufficient documentation

## 2014-05-25 DIAGNOSIS — R918 Other nonspecific abnormal finding of lung field: Secondary | ICD-10-CM | POA: Diagnosis not present

## 2014-05-25 DIAGNOSIS — G4700 Insomnia, unspecified: Secondary | ICD-10-CM | POA: Diagnosis not present

## 2014-05-25 DIAGNOSIS — Z880 Allergy status to penicillin: Secondary | ICD-10-CM | POA: Diagnosis not present

## 2014-05-25 DIAGNOSIS — F419 Anxiety disorder, unspecified: Secondary | ICD-10-CM | POA: Diagnosis present

## 2014-05-25 LAB — CBC WITH DIFFERENTIAL/PLATELET
Basophils Absolute: 0.1 10*3/uL (ref 0.0–0.1)
Basophils Relative: 1 % (ref 0–1)
Eosinophils Absolute: 0.3 10*3/uL (ref 0.0–0.7)
Eosinophils Relative: 4 % (ref 0–5)
HCT: 46.4 % (ref 39.0–52.0)
Hemoglobin: 15.8 g/dL (ref 13.0–17.0)
Lymphocytes Relative: 28 % (ref 12–46)
Lymphs Abs: 2.1 10*3/uL (ref 0.7–4.0)
MCH: 32.4 pg (ref 26.0–34.0)
MCHC: 34.1 g/dL (ref 30.0–36.0)
MCV: 95.3 fL (ref 78.0–100.0)
Monocytes Absolute: 0.7 10*3/uL (ref 0.1–1.0)
Monocytes Relative: 9 % (ref 3–12)
Neutro Abs: 4.3 10*3/uL (ref 1.7–7.7)
Neutrophils Relative %: 58 % (ref 43–77)
Platelets: 263 10*3/uL (ref 150–400)
RBC: 4.87 MIL/uL (ref 4.22–5.81)
RDW: 13.6 % (ref 11.5–15.5)
WBC: 7.5 10*3/uL (ref 4.0–10.5)

## 2014-05-25 LAB — RAPID URINE DRUG SCREEN, HOSP PERFORMED
Amphetamines: NOT DETECTED
Barbiturates: NOT DETECTED
Benzodiazepines: POSITIVE — AB
Cocaine: NOT DETECTED
Opiates: NOT DETECTED
Tetrahydrocannabinol: NOT DETECTED

## 2014-05-25 LAB — COMPREHENSIVE METABOLIC PANEL
ALT: 18 U/L (ref 0–53)
AST: 32 U/L (ref 0–37)
Albumin: 4.8 g/dL (ref 3.5–5.2)
Alkaline Phosphatase: 50 U/L (ref 39–117)
Anion gap: 10 (ref 5–15)
BUN: 24 mg/dL — ABNORMAL HIGH (ref 6–23)
CO2: 27 mmol/L (ref 19–32)
Calcium: 9.1 mg/dL (ref 8.4–10.5)
Chloride: 104 mmol/L (ref 96–112)
Creatinine, Ser: 0.93 mg/dL (ref 0.50–1.35)
GFR calc Af Amer: >90 mL/min (ref >90)
GFR calc non Af Amer: 86 mL/min — ABNORMAL LOW (ref >90)
Glucose, Bld: 121 mg/dL — ABNORMAL HIGH (ref 70–99)
Potassium: 3.2 mmol/L — ABNORMAL LOW (ref 3.5–5.1)
Sodium: 141 mmol/L (ref 135–145)
Total Bilirubin: 0.7 mg/dL (ref 0.3–1.2)
Total Protein: 8.1 g/dL (ref 6.0–8.3)

## 2014-05-25 LAB — ETHANOL: Alcohol, Ethyl (B): <5 mg/dL (ref 0–9)

## 2014-05-25 MED ORDER — ONDANSETRON HCL 4 MG PO TABS: 4.0000 mg | ORAL_TABLET | Freq: Three times a day (TID) | ORAL | Status: DC | PRN

## 2014-05-25 MED ORDER — NICOTINE 21 MG/24HR TD PT24: 21.0000 mg | MEDICATED_PATCH | Freq: Every day | TRANSDERMAL | Status: DC

## 2014-05-25 MED ORDER — ALUM & MAG HYDROXIDE-SIMETH 200-200-20 MG/5ML PO SUSP
30.0000 mL | ORAL | Status: DC | PRN
Start: 2014-05-25 — End: 2014-05-26

## 2014-05-25 MED ORDER — ACETAMINOPHEN 325 MG PO TABS: 650.0000 mg | ORAL_TABLET | ORAL | Status: DC | PRN

## 2014-05-25 NOTE — ED Notes (Signed)
Social worker at bedside.Family remains at bedside

## 2014-05-25 NOTE — Progress Notes (Addendum)
CSW received consult for ALF/SNF placement and pt anxiety about returning home alone. CSW met with pt and pt sister, and sister in law at bedside. Pt presented with rapid speech, stating he couldn't return home, I'm  Can't take of myself, I wont take care of myself, I need help, I need help." patient continued to repeat, rocking back and forth, pointing to his medications, and stating the same thing over and over. Pt stated he couldn't go home with family and that he couldn't sleep and couldn't eat. Pt states, "I need to sleep I need to sleep I need meds." Patient states, "can i go back to bhh, I need bhh, I need bhh." Patietn denies SI/HI/AH/VH however unable to contract for safety. CSW asked patietn if she was scared of harming himself and he would only reply, I can't go back alone I'm scared i can't do it. CSw consulted with TTS.   Belia Heman, [place] Work  [employer] [phone]

## 2014-05-25 NOTE — ED Notes (Addendum)
Aldona Bar, RN at [facility] reported to send patient after 7 am and to call reports after 7 to day shift. Number to call report is [phone]. [facility] service informed of transport in the morning after 7 am.

## 2014-05-25 NOTE — Progress Notes (Signed)
[facility] Assessment Progress Note  Referral information for patient has been faxed out to [facility], [place], [place], as they indicated they have bed availability.   [facility], [facility], [place] are all at capacity.    Marisa Cyphers, MS Counselor

## 2014-05-25 NOTE — ED Provider Notes (Signed)
CSN: [id number]     Arrival date & time 05/25/14  1041 History  This chart was scribed for Ricky Ferguson, MD by Chester Holstein, ED Scribe. This patient was seen in room WTR6/WTR6.    Chief Complaint  Patient presents with  . Insomnia  . Anxiety   Patient is a 64 y.o. male presenting with anxiety. The history is provided by the patient and a relative.  Anxiety   HPI Comments: Ricky Ward is a 64 y.o. male who presents to the Emergency Department complaining of anxiety and trouble sleeping with onset 1 year ago, worsening this month. Pt with h/o bipolar disorder and insomnia. Pt has a bag of insomnia medications which he states give him no relief. Pt's family states he has not been eating. Pt lives by himself. He reports he "just wants some help." Pt denies EtOH and drug abuse. He denies suicidal and homicidal ideation and any other medical complaints currently. Pt too afraid to go home alone, family requesting help with placement.   Past Medical History  Diagnosis Date  . Bipolar 1 disorder    Past Surgical History  Procedure Laterality Date  . Foot surgery  08/23/2003   History reviewed. No pertinent family history. History  Substance Use Topics  . Smoking status: Never Smoker   . Smokeless tobacco: Not on file  . Alcohol Use: No    Review of Systems  Psychiatric/Behavioral: Positive for sleep disturbance. Negative for suicidal ideas and self-injury. The patient is nervous/anxious.        Homicidal ideation  All other systems reviewed and are negative.     Allergies  Penicillins  Home Medications   Prior to Admission medications   Medication Sig Start Date End Date Taking? Authorizing Provider  acyclovir (ZOVIRAX) 200 MG capsule Take 200 mg by mouth every 6 (six) hours.   Yes Historical Provider, MD  ALPRAZolam Duanne Moron) 1 MG tablet Take 1 mg by mouth at bedtime as needed for anxiety or sleep.   Yes Historical Provider, MD  LEVOTHYROXINE SODIUM PO Take 0.075 mg by  mouth daily.    Yes Historical Provider, MD  nystatin cream (MYCOSTATIN) Apply 1 application topically daily.   Yes Historical Provider, MD  QUEtiapine (SEROQUEL) 50 MG tablet Take 50 mg by mouth at bedtime.   Yes Historical Provider, MD  clotrimazole-betamethasone (LOTRISONE) cream Apply to affected area 2 times daily prn Patient not taking: Reported on 05/25/2014 03/27/14   Billy Fischer, MD  zolpidem (AMBIEN) 5 MG tablet Take 1 tablet (5 mg total) by mouth at bedtime as needed for sleep. Patient not taking: Reported on 05/25/2014 03/20/14   Melony Overly, MD   BP 132/94 mmHg  Pulse 73  Temp(Src) 97.9 F (36.6 C) (Oral)  Resp 16  SpO2 99% Physical Exam  Constitutional: He is oriented to person, place, and time. He appears well-developed and well-nourished.  HENT:  Head: Normocephalic and atraumatic.  Eyes: EOM are normal.  Neck: Normal range of motion.  Cardiovascular: Normal rate.   Pulmonary/Chest: Effort normal.  Musculoskeletal: Normal range of motion.  Neurological: He is alert and oriented to person, place, and time.  Skin: Skin is warm and dry.  Psychiatric: His behavior is normal. Judgment and thought content normal. His mood appears anxious. His speech is tangential. He expresses no homicidal and no suicidal ideation. He expresses no suicidal plans and no homicidal plans.  Nursing note and vitals reviewed.   ED Course  Procedures (including critical care time)  DIAGNOSTIC STUDIES:   COORDINATION OF CARE: Discussed treatment plan with patient at beside, the patient agrees with the plan and has no further questions at this time.   Labs Review Labs Reviewed  COMPREHENSIVE METABOLIC PANEL - Abnormal; Notable for the following:    Potassium 3.2 (*)    Glucose, Bld 121 (*)    BUN 24 (*)    GFR calc non Af Amer 86 (*)    All other components within normal limits  URINE RAPID DRUG SCREEN (HOSP PERFORMED) - Abnormal; Notable for the following:    Benzodiazepines POSITIVE (*)     All other components within normal limits  CBC WITH DIFFERENTIAL/PLATELET  ETHANOL    Imaging Review Dg Chest 2 View  05/25/2014   CLINICAL DATA:  Anxiety, insomnia  EXAM: CHEST  2 VIEW  COMPARISON:  04/16/2013  FINDINGS: Cardiomediastinal silhouette is stable. Hyperinflation again noted. Bony thorax is unremarkable. No acute infiltrate or pleural effusion.  IMPRESSION: No active cardiopulmonary disease.  Hyperinflation again noted.   Electronically Signed   By: Lahoma Crocker M.D.   On: 05/25/2014 17:47     EKG Interpretation None     Meds ordered this encounter  Medications  . ALPRAZolam (XANAX) 1 MG tablet    Sig: Take 1 mg by mouth at bedtime as needed for anxiety or sleep.  Marland Kitchen QUEtiapine (SEROQUEL) 50 MG tablet    Sig: Take 50 mg by mouth at bedtime.  Marland Kitchen acyclovir (ZOVIRAX) 200 MG capsule    Sig: Take 200 mg by mouth every 6 (six) hours.  Marland Kitchen nystatin cream (MYCOSTATIN)    Sig: Apply 1 application topically daily.  Marland Kitchen acetaminophen (TYLENOL) tablet 650 mg    Sig:   . DISCONTD: nicotine (NICODERM CQ - dosed in mg/24 hours) patch 21 mg    Sig:   . ondansetron (ZOFRAN) tablet 4 mg    Sig:   . alum & mag hydroxide-simeth (MAALOX/MYLANTA) 200-200-20 MG/5ML suspension 30 mL    Sig:    MDM   Final diagnoses:  Bipolar 1 disorder  Insomnia   Pt is a 64yo male presenting to ED with c/o worsening insomnia and anorexia.  Consulted with TTS who recommended Social work. Social work examined pt, believes pt is having a manic episode, recommended TTS who reevaluated pt. Pt meets criteria for inpatient geriatric psych.   I personally performed the services described in this documentation, which was scribed in my presence. The recorded information has been reviewed and is accurate.   Noland Fordyce, PA-C 05/25/14 [place], MD 05/26/14 [phone]

## 2014-05-25 NOTE — ED Notes (Addendum)
Pt has had months of not sleeping, not eating.  States can't do this anymore.  Pt has hx of biplar.  Pt has had meds changed by MD.  Not working.  Pt sister at bedside.  Pt wanting help. Can't stay by self.  Keeps repeating self.

## 2014-05-25 NOTE — BH Assessment (Signed)
Writer called [facility] and asked that the tele assessment machine be put into the pt's room.

## 2014-05-25 NOTE — BH Assessment (Signed)
Accepted to [facility] per Aldona Bar. Accepted by Dr. Sharee Holster to arrive anytime. Report to be called to [phone] ask for gero psyc  Informed [place] RN.    Informed Junius Creamer, NP.  Lear Ng, [facility] Triage Specialist 05/25/2014 8:15 PM

## 2014-05-25 NOTE — BH Assessment (Addendum)
Tele Assessment Note   Ricky Ward is an 64 y.o. male presented to [place] with his daughter and sister in law because he hasn't been feeling like himself. Pt reports that he lives by himself but would like to move to an assisted living facility. Pt reports feeling depressed for a few months with symptoms including crying spells, loss of appetite, helplessness, hopelessness, and isolating himself. Pt reports losing 30 lbs in the last 3 months. Pt reports high anxiety and panic attacks since march of 2015. Pt reports the panic attacks happen every couple of days with the last one being on 05/22/14. Pt reports a feeling of pending doom most days. Pt denies SI, HI, substance abuse, any form of abuse, and access to weapons. Pt reports that he needs sleep and is only getting 30 mins to an hour a night. Pt reports he can see the paint falling off the walls even though he knows it is not coming off the walls. Pt reports he is stressed by environmental changes which cause him to worry about further changes and if he will have enough money. Pt reports feeling he is in a haze and racing thoughts. Pt denies any major memory loss but has left a pot of water bowling on the stove in the last three months. Per Waylan Boga, NP, Pt meets inpatient gero psych criteria. Placement will be sought for Pt.   Axis I: F31.0 Bipolar I disorder, Current or most recnt episode hypomanic Axis II: Deferred Axis III:  Past Medical History  Diagnosis Date  . Bipolar 1 disorder    Axis IV: housing problems and problems with access to health care services Axis V: 21-30 behavior considerably influenced by delusions or hallucinations OR serious impairment in judgment, communication OR inability to function in almost all areas  Past Medical History:  Past Medical History  Diagnosis Date  . Bipolar 1 disorder     Past Surgical History  Procedure Laterality Date  . Foot surgery  08/23/2003    Family History: History reviewed. No  pertinent family history.  Social History:  reports that he has never smoked. He does not have any smokeless tobacco history on file. He reports that he does not drink alcohol or use illicit drugs.  Additional Social History:  Alcohol / Drug Use Pain Medications: pt denies Prescriptions: pt denies Over the Counter: pt denies History of alcohol / drug use?: Yes Longest period of sobriety (when/how long): current, year and year  CIWA: CIWA-Ar BP: 116/67 mmHg Pulse Rate: 85 COWS:    PATIENT STRENGTHS: (choose at least two) Ability for insight Communication skills Physical Health  Allergies:  Allergies  Allergen Reactions  . Penicillins Swelling    Home Medications:  (Not in a hospital admission)  OB/GYN Status:  No LMP for male patient.  General Assessment Data [facility] of Assessment: [facility] Is this a Tele or Face-to-Face Assessment?: Face-to-Face Is this an Initial Assessment or a Re-assessment for this encounter?: Initial Assessment Living Arrangements: Alone Can pt return to current living arrangement?: Yes Admission Status: Voluntary Is patient capable of signing voluntary admission?: Yes Transfer from: Home Referral Source: Self/Family/Friend  Medical Screening Exam ([place]) Medical Exam completed: Yes  [place] Living Arrangements: Alone  Education Status Is patient currently in school?: No Highest grade of school patient has completed: 6  Risk to self with the past 6 months Suicidal Ideation: No Suicidal Intent: No Is patient at risk for suicide?: No Suicidal Plan?:  No Access to Means: No Previous Attempts/Gestures: No Triggers for Past Attempts: None known Intentional Self Injurious Behavior: None Family Suicide History: Unknown Recent stressful life event(s): Loss (Comment), Turmoil (Comment) (changes in environment, getting older loss) Persecutory voices/beliefs?: No Depression: Yes Depression Symptoms: Insomnia, Feeling  worthless/self pity, Fatigue, Tearfulness, Isolating, Loss of interest in usual pleasures Substance abuse history and/or treatment for substance abuse?: No Suicide prevention information given to non-admitted patients: Not applicable  Risk to Others within the past 6 months Homicidal Ideation: No Thoughts of Harm to Others: No Current Homicidal Intent: No Current Homicidal Plan: No Access to Homicidal Means: No History of harm to others?: No Assessment of Violence: None Noted Does patient have access to weapons?: No Criminal Charges Pending?: No Does patient have a court date: No  Psychosis Hallucinations: Visual (pt sees paint falling off the wall) Delusions: Unspecified (feelings of pending doom )  Mental Status Report Appearance/Hygiene: Layered clothes, Unremarkable Eye Contact: Fair Motor Activity: Freedom of movement, Unremarkable Speech: Logical/coherent, Rapid, Pressured Level of Consciousness: Alert Mood: Anxious, Depressed, Apprehensive, Helpless, Preoccupied Affect: Anxious, Appropriate to circumstance, Depressed Anxiety Level: Severe Panic attack frequency: almost every day Most recent panic attack: 05/22/14 Thought Processes: Coherent, Relevant Judgement: Impaired Orientation: Person, Place, Time, Situation Obsessive Compulsive Thoughts/Behaviors: None  Cognitive Functioning Concentration: Decreased Memory: Recent Intact, Remote Intact IQ: Average Insight: Fair Impulse Control: Fair Appetite: Fair Weight Loss: 30 (in 3 months) Sleep: Decreased Total Hours of Sleep: 0 (30 mins to an hour) Vegetative Symptoms: None  ADLScreening [facility] Assessment Services) Patient's cognitive ability adequate to safely complete daily activities?: Yes Patient able to express need for assistance with ADLs?: Yes Independently performs ADLs?: Yes (appropriate for developmental age)  Prior Inpatient Therapy Prior Inpatient Therapy: Yes Prior Therapy Dates: 1991 Prior [facility]  [facility] Reason for Treatment: substance absue  Prior Outpatient Therapy Prior Outpatient Therapy: No  ADL Screening (condition at time of admission) Patient's cognitive ability adequate to safely complete daily activities?: Yes Is the patient deaf or have difficulty hearing?: No Does the patient have difficulty seeing, even when wearing glasses/contacts?: No Does the patient have difficulty concentrating, remembering, or making decisions?: No Patient able to express need for assistance with ADLs?: Yes Does the patient have difficulty dressing or bathing?: No Independently performs ADLs?: Yes (appropriate for developmental age) Does the patient have difficulty walking or climbing stairs?: No       Abuse/Neglect Assessment (Assessment to be complete while patient is alone) Physical Abuse: Denies Verbal Abuse: Denies Sexual Abuse: Denies Exploitation of patient/patient's resources: Denies Self-Neglect: Denies Values / Beliefs Cultural Requests During Hospitalization: None Spiritual Requests During Hospitalization: None Consults Spiritual Care Consult Needed: No Social Work Consult Needed: No Regulatory affairs officer (For Healthcare) Does patient have an advance directive?: No Would patient like information on creating an advanced directive?: No - patient declined information    Additional Information 1:1 In Past 12 Months?: No CIRT Risk: No Elopement Risk: No Does patient have medical clearance?: No     Disposition:  Disposition Initial Assessment Completed for this Encounter: Yes Disposition of Patient: Inpatient treatment program Type of inpatient treatment program: Adult (gero psych)  Ruben Reason, MA, LPCA, [place] Therapeutic Triage Specialist [facility]   05/25/2014 3:25 PM

## 2014-05-26 MED ORDER — DOXEPIN HCL 50 MG PO CAPS
50.0000 mg | ORAL_CAPSULE | Freq: Every evening | ORAL | Status: DC | PRN
  Filled 2014-05-26 (×3): qty 1

## 2014-05-26 NOTE — ED Notes (Signed)
Patient admits to [facility] with no plan and VH. Patient states that he is just over whelmed and needs help. Plan of care discuss with patient . Patient voices no concerns at this time. Encouragement and support provided and safety maintain. Q 15 min safety checks remain in place.

## 2014-05-26 NOTE — Progress Notes (Signed)
Pt d/c to [facility] in [place], [place] as per order. Calm cooperative with d/c procedure. D/C instructions reviewed with pt, understanding  Verbalized. All medications was picked up from pharmacy by writer and showed to pt on arrival. All belongings in locker 41 given to pt and belonging sheet signed in agreement to items received. Pt was alert & oriented X 3. Vitals done and recorded, WNL. No physical distress noted at time of departure. Safety maintained on Q 15 minutes checks as ordered till time of departure.

## 2014-05-26 NOTE — ED Notes (Signed)
Patient sitting in bed rocking back and forth refusing medication for anxiety at this time. Writer and Jinny Blossom, RN went in to encourage patient to medications. Encouragement and support provided and safety maintain. Q 15 min safety checks remain in place.

## 2014-05-28 DIAGNOSIS — J189 Pneumonia, unspecified organism: Secondary | ICD-10-CM | POA: Diagnosis not present

## 2014-05-28 DIAGNOSIS — J181 Lobar pneumonia, unspecified organism: Secondary | ICD-10-CM | POA: Diagnosis not present

## 2014-05-28 DIAGNOSIS — R05 Cough: Secondary | ICD-10-CM | POA: Diagnosis not present

## 2014-05-31 DIAGNOSIS — J189 Pneumonia, unspecified organism: Secondary | ICD-10-CM | POA: Diagnosis not present

## 2014-06-04 DIAGNOSIS — R05 Cough: Secondary | ICD-10-CM | POA: Diagnosis not present

## 2014-06-04 DIAGNOSIS — J449 Chronic obstructive pulmonary disease, unspecified: Secondary | ICD-10-CM | POA: Diagnosis not present

## 2014-06-11 DIAGNOSIS — F419 Anxiety disorder, unspecified: Secondary | ICD-10-CM | POA: Diagnosis not present

## 2014-06-11 DIAGNOSIS — F3132 Bipolar disorder, current episode depressed, moderate: Secondary | ICD-10-CM | POA: Diagnosis not present

## 2014-07-15 DIAGNOSIS — E038 Other specified hypothyroidism: Secondary | ICD-10-CM | POA: Diagnosis not present

## 2014-07-15 DIAGNOSIS — F319 Bipolar disorder, unspecified: Secondary | ICD-10-CM | POA: Diagnosis not present

## 2014-08-30 DIAGNOSIS — M79672 Pain in left foot: Secondary | ICD-10-CM | POA: Diagnosis not present

## 2014-08-30 DIAGNOSIS — B351 Tinea unguium: Secondary | ICD-10-CM | POA: Diagnosis not present

## 2014-10-22 DIAGNOSIS — F319 Bipolar disorder, unspecified: Secondary | ICD-10-CM | POA: Diagnosis not present

## 2014-10-22 DIAGNOSIS — Z23 Encounter for immunization: Secondary | ICD-10-CM | POA: Diagnosis not present

## 2014-11-01 DIAGNOSIS — E038 Other specified hypothyroidism: Secondary | ICD-10-CM | POA: Diagnosis not present

## 2014-11-01 DIAGNOSIS — F319 Bipolar disorder, unspecified: Secondary | ICD-10-CM | POA: Diagnosis not present

## 2014-11-22 DIAGNOSIS — M79674 Pain in right toe(s): Secondary | ICD-10-CM | POA: Diagnosis not present

## 2014-11-22 DIAGNOSIS — B351 Tinea unguium: Secondary | ICD-10-CM | POA: Diagnosis not present

## 2014-11-22 DIAGNOSIS — M79675 Pain in left toe(s): Secondary | ICD-10-CM | POA: Diagnosis not present

## 2014-12-02 DIAGNOSIS — H25813 Combined forms of age-related cataract, bilateral: Secondary | ICD-10-CM | POA: Diagnosis not present

## 2015-01-31 DIAGNOSIS — F319 Bipolar disorder, unspecified: Secondary | ICD-10-CM | POA: Diagnosis not present

## 2015-01-31 DIAGNOSIS — F3130 Bipolar disorder, current episode depressed, mild or moderate severity, unspecified: Secondary | ICD-10-CM | POA: Diagnosis not present

## 2015-01-31 DIAGNOSIS — E038 Other specified hypothyroidism: Secondary | ICD-10-CM | POA: Diagnosis not present

## 2015-05-09 DIAGNOSIS — E038 Other specified hypothyroidism: Secondary | ICD-10-CM | POA: Diagnosis not present

## 2015-05-09 DIAGNOSIS — K5909 Other constipation: Secondary | ICD-10-CM | POA: Diagnosis not present

## 2015-05-09 DIAGNOSIS — F319 Bipolar disorder, unspecified: Secondary | ICD-10-CM | POA: Diagnosis not present

## 2015-05-09 DIAGNOSIS — L309 Dermatitis, unspecified: Secondary | ICD-10-CM | POA: Diagnosis not present

## 2015-06-09 DIAGNOSIS — H25813 Combined forms of age-related cataract, bilateral: Secondary | ICD-10-CM | POA: Diagnosis not present

## 2015-08-08 DIAGNOSIS — L219 Seborrheic dermatitis, unspecified: Secondary | ICD-10-CM | POA: Diagnosis not present

## 2015-08-08 DIAGNOSIS — E038 Other specified hypothyroidism: Secondary | ICD-10-CM | POA: Diagnosis not present

## 2015-08-08 DIAGNOSIS — F319 Bipolar disorder, unspecified: Secondary | ICD-10-CM | POA: Diagnosis not present

## 2015-09-07 DIAGNOSIS — B351 Tinea unguium: Secondary | ICD-10-CM | POA: Diagnosis not present

## 2015-09-07 DIAGNOSIS — M79675 Pain in left toe(s): Secondary | ICD-10-CM | POA: Diagnosis not present

## 2015-09-07 DIAGNOSIS — M79674 Pain in right toe(s): Secondary | ICD-10-CM | POA: Diagnosis not present

## 2015-11-10 DIAGNOSIS — F3170 Bipolar disorder, currently in remission, most recent episode unspecified: Secondary | ICD-10-CM | POA: Diagnosis not present

## 2015-11-10 DIAGNOSIS — Z23 Encounter for immunization: Secondary | ICD-10-CM | POA: Diagnosis not present

## 2015-11-10 DIAGNOSIS — E038 Other specified hypothyroidism: Secondary | ICD-10-CM | POA: Diagnosis not present

## 2015-11-10 DIAGNOSIS — Z0000 Encounter for general adult medical examination without abnormal findings: Secondary | ICD-10-CM | POA: Diagnosis not present

## 2015-11-10 DIAGNOSIS — F319 Bipolar disorder, unspecified: Secondary | ICD-10-CM | POA: Diagnosis not present

## 2015-11-30 DIAGNOSIS — H04123 Dry eye syndrome of bilateral lacrimal glands: Secondary | ICD-10-CM | POA: Diagnosis not present

## 2015-11-30 DIAGNOSIS — H25813 Combined forms of age-related cataract, bilateral: Secondary | ICD-10-CM | POA: Diagnosis not present

## 2016-01-03 DIAGNOSIS — F3170 Bipolar disorder, currently in remission, most recent episode unspecified: Secondary | ICD-10-CM | POA: Diagnosis not present

## 2016-01-30 IMAGING — CR DG CHEST 2V
2 series · 2 of 2 positions shown · non-contrast
Comparison: None.

CLINICAL DATA: Medical clearance.  Nonsmoker.

EXAM:
CHEST  2 VIEW

[w chest pa]
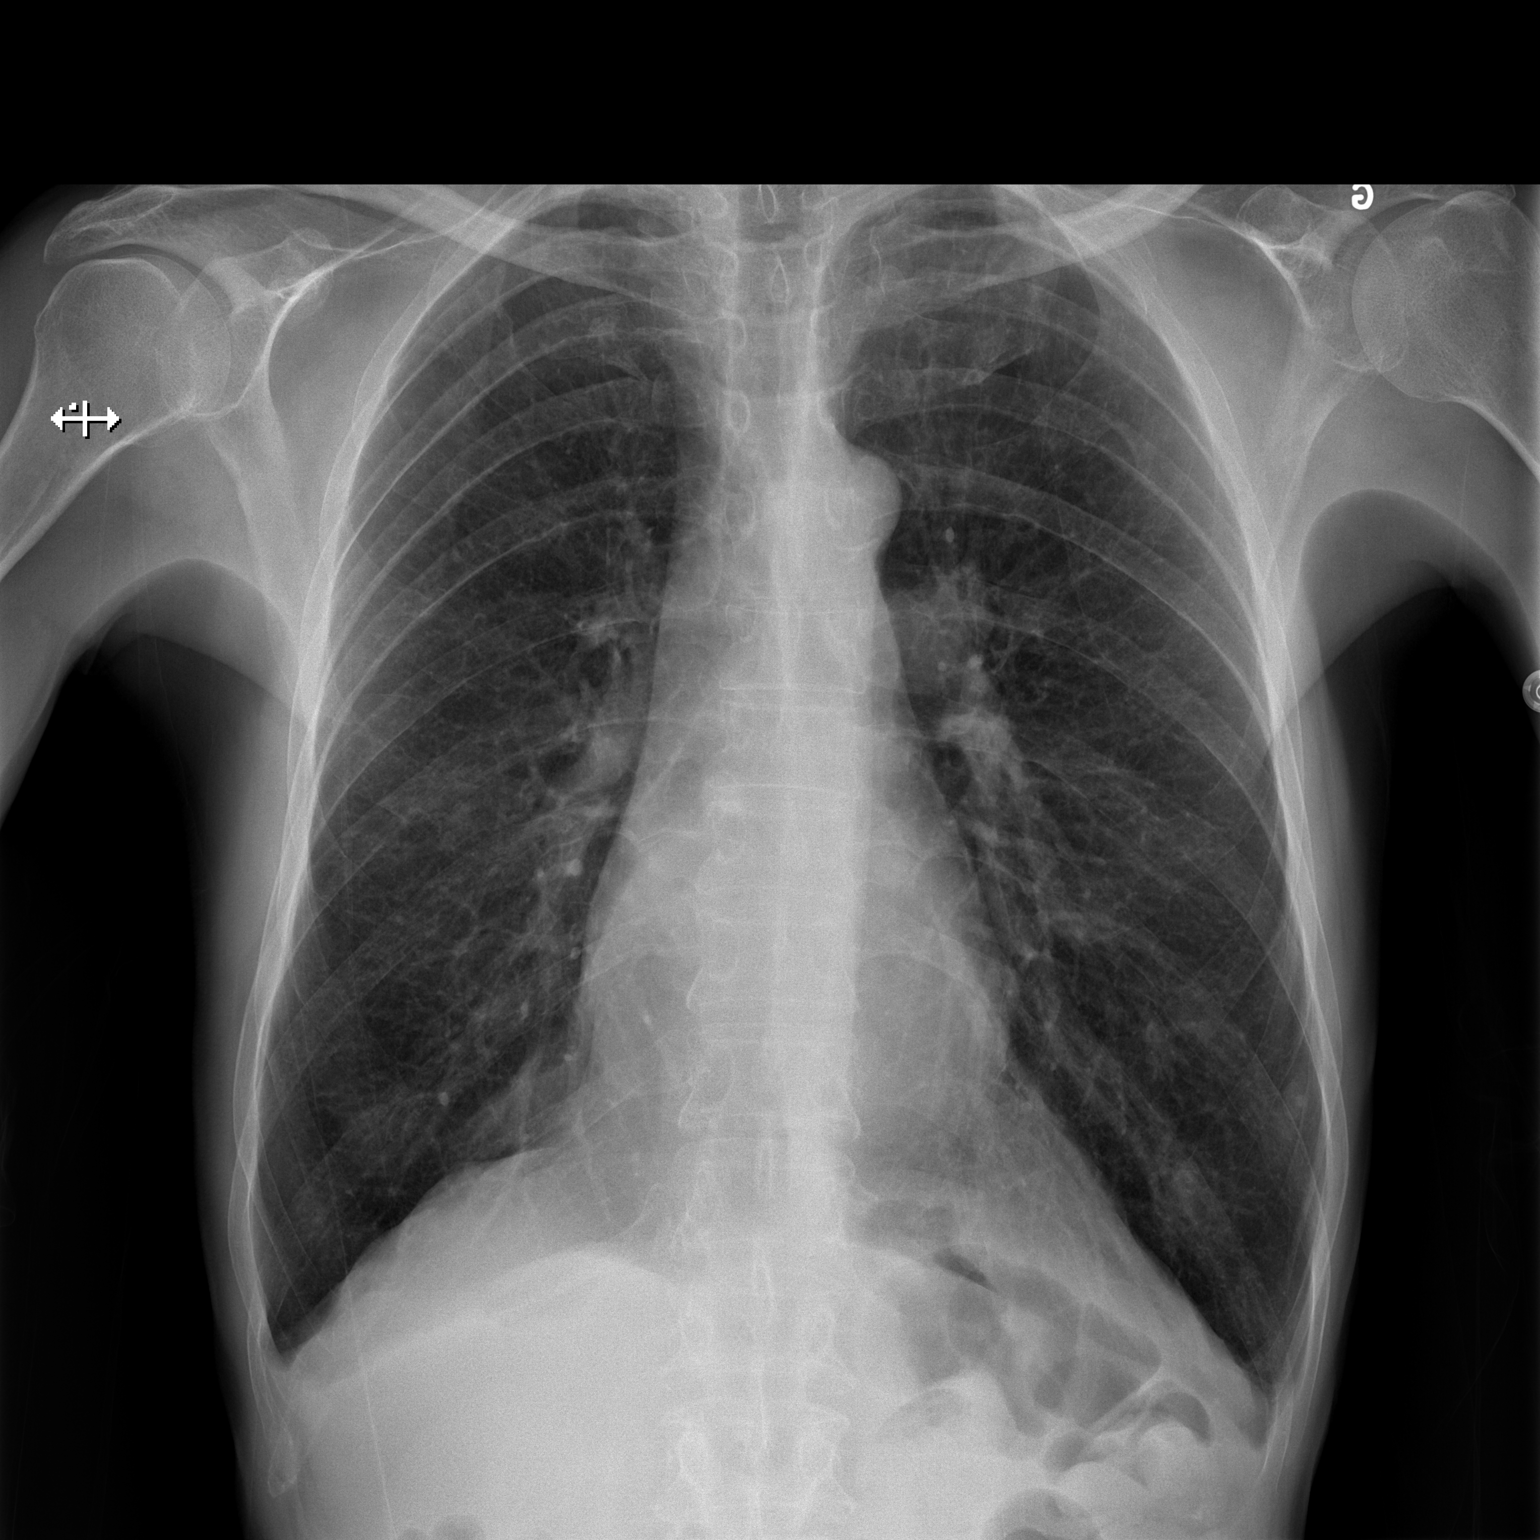

[w chest lat]
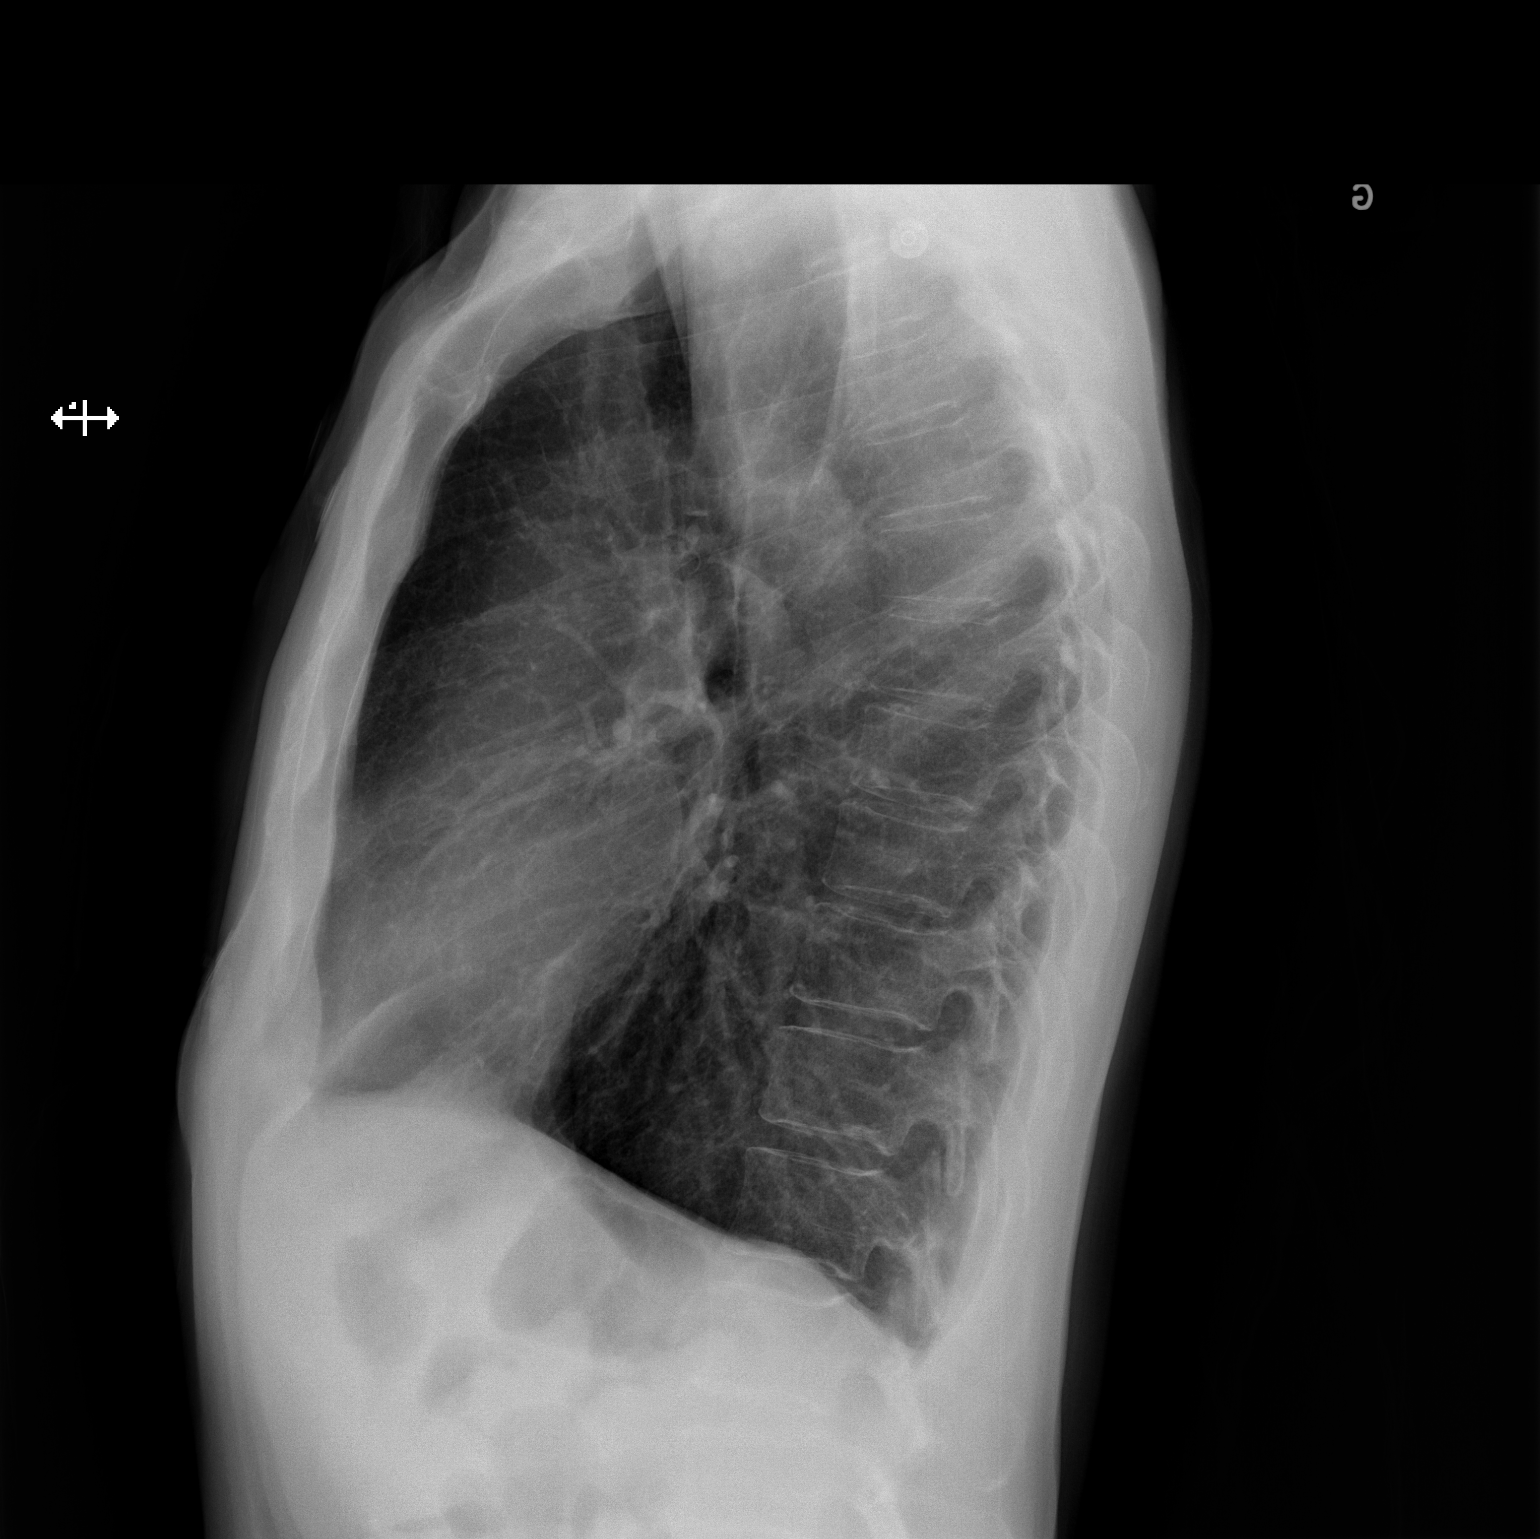

[2 of 2 positions shown; findings below may reference images not displayed]

FINDINGS: No infiltrate, congestive heart failure or pneumothorax. Cardiac
leads overlie the left lower lung.

Chronic increased lung markings suspected.

Heart size within normal limits.

Calcified aorta.
IMPRESSION: No acute abnormality.  Please see above.

## 2016-02-02 DIAGNOSIS — F3170 Bipolar disorder, currently in remission, most recent episode unspecified: Secondary | ICD-10-CM | POA: Diagnosis not present

## 2016-02-02 DIAGNOSIS — E038 Other specified hypothyroidism: Secondary | ICD-10-CM | POA: Diagnosis not present

## 2016-03-06 DIAGNOSIS — J209 Acute bronchitis, unspecified: Secondary | ICD-10-CM | POA: Diagnosis not present

## 2016-04-09 DIAGNOSIS — M79675 Pain in left toe(s): Secondary | ICD-10-CM | POA: Diagnosis not present

## 2016-04-09 DIAGNOSIS — M79674 Pain in right toe(s): Secondary | ICD-10-CM | POA: Diagnosis not present

## 2016-04-09 DIAGNOSIS — B351 Tinea unguium: Secondary | ICD-10-CM | POA: Diagnosis not present

## 2016-04-26 DIAGNOSIS — C44319 Basal cell carcinoma of skin of other parts of face: Secondary | ICD-10-CM | POA: Diagnosis not present

## 2016-04-26 DIAGNOSIS — D0439 Carcinoma in situ of skin of other parts of face: Secondary | ICD-10-CM | POA: Diagnosis not present

## 2016-04-26 DIAGNOSIS — L400 Psoriasis vulgaris: Secondary | ICD-10-CM | POA: Diagnosis not present

## 2016-05-08 DIAGNOSIS — E038 Other specified hypothyroidism: Secondary | ICD-10-CM | POA: Diagnosis not present

## 2016-05-08 DIAGNOSIS — F319 Bipolar disorder, unspecified: Secondary | ICD-10-CM | POA: Diagnosis not present

## 2016-06-15 DIAGNOSIS — H04123 Dry eye syndrome of bilateral lacrimal glands: Secondary | ICD-10-CM | POA: Diagnosis not present

## 2016-06-15 DIAGNOSIS — H25813 Combined forms of age-related cataract, bilateral: Secondary | ICD-10-CM | POA: Diagnosis not present

## 2016-08-07 DIAGNOSIS — L408 Other psoriasis: Secondary | ICD-10-CM | POA: Diagnosis not present

## 2016-08-07 DIAGNOSIS — F3170 Bipolar disorder, currently in remission, most recent episode unspecified: Secondary | ICD-10-CM | POA: Diagnosis not present

## 2016-08-07 DIAGNOSIS — E038 Other specified hypothyroidism: Secondary | ICD-10-CM | POA: Diagnosis not present

## 2016-08-20 DIAGNOSIS — B351 Tinea unguium: Secondary | ICD-10-CM | POA: Diagnosis not present

## 2016-08-20 DIAGNOSIS — M79674 Pain in right toe(s): Secondary | ICD-10-CM | POA: Diagnosis not present

## 2016-08-20 DIAGNOSIS — M79675 Pain in left toe(s): Secondary | ICD-10-CM | POA: Diagnosis not present

## 2016-09-06 DIAGNOSIS — F3170 Bipolar disorder, currently in remission, most recent episode unspecified: Secondary | ICD-10-CM | POA: Diagnosis not present

## 2016-09-06 DIAGNOSIS — E038 Other specified hypothyroidism: Secondary | ICD-10-CM | POA: Diagnosis not present

## 2016-09-12 DIAGNOSIS — F3170 Bipolar disorder, currently in remission, most recent episode unspecified: Secondary | ICD-10-CM | POA: Diagnosis not present

## 2016-09-12 DIAGNOSIS — E038 Other specified hypothyroidism: Secondary | ICD-10-CM | POA: Diagnosis not present

## 2016-09-12 DIAGNOSIS — Z79899 Other long term (current) drug therapy: Secondary | ICD-10-CM | POA: Diagnosis not present

## 2016-09-18 DIAGNOSIS — F331 Major depressive disorder, recurrent, moderate: Secondary | ICD-10-CM | POA: Diagnosis not present

## 2016-10-17 DIAGNOSIS — L308 Other specified dermatitis: Secondary | ICD-10-CM | POA: Diagnosis not present

## 2016-10-17 DIAGNOSIS — D485 Neoplasm of uncertain behavior of skin: Secondary | ICD-10-CM | POA: Diagnosis not present

## 2016-10-17 DIAGNOSIS — L409 Psoriasis, unspecified: Secondary | ICD-10-CM | POA: Diagnosis not present

## 2016-11-05 DIAGNOSIS — M79674 Pain in right toe(s): Secondary | ICD-10-CM | POA: Diagnosis not present

## 2016-11-05 DIAGNOSIS — B351 Tinea unguium: Secondary | ICD-10-CM | POA: Diagnosis not present

## 2016-11-05 DIAGNOSIS — M79675 Pain in left toe(s): Secondary | ICD-10-CM | POA: Diagnosis not present

## 2016-11-07 DIAGNOSIS — F319 Bipolar disorder, unspecified: Secondary | ICD-10-CM | POA: Diagnosis not present

## 2016-11-07 DIAGNOSIS — Z23 Encounter for immunization: Secondary | ICD-10-CM | POA: Diagnosis not present

## 2016-11-07 DIAGNOSIS — E038 Other specified hypothyroidism: Secondary | ICD-10-CM | POA: Diagnosis not present

## 2016-11-26 DIAGNOSIS — L219 Seborrheic dermatitis, unspecified: Secondary | ICD-10-CM | POA: Diagnosis not present

## 2016-11-26 DIAGNOSIS — L409 Psoriasis, unspecified: Secondary | ICD-10-CM | POA: Diagnosis not present

## 2016-12-27 DIAGNOSIS — H25813 Combined forms of age-related cataract, bilateral: Secondary | ICD-10-CM | POA: Diagnosis not present

## 2017-01-14 DIAGNOSIS — M79675 Pain in left toe(s): Secondary | ICD-10-CM | POA: Diagnosis not present

## 2017-01-14 DIAGNOSIS — M79674 Pain in right toe(s): Secondary | ICD-10-CM | POA: Diagnosis not present

## 2017-01-14 DIAGNOSIS — B351 Tinea unguium: Secondary | ICD-10-CM | POA: Diagnosis not present

## 2017-01-16 DIAGNOSIS — F3110 Bipolar disorder, current episode manic without psychotic features, unspecified: Secondary | ICD-10-CM | POA: Diagnosis not present

## 2017-01-31 DIAGNOSIS — R739 Hyperglycemia, unspecified: Secondary | ICD-10-CM | POA: Diagnosis not present

## 2017-01-31 DIAGNOSIS — E038 Other specified hypothyroidism: Secondary | ICD-10-CM | POA: Diagnosis not present

## 2017-01-31 DIAGNOSIS — Z131 Encounter for screening for diabetes mellitus: Secondary | ICD-10-CM | POA: Diagnosis not present

## 2017-01-31 DIAGNOSIS — Z0000 Encounter for general adult medical examination without abnormal findings: Secondary | ICD-10-CM | POA: Diagnosis not present

## 2017-01-31 DIAGNOSIS — F3170 Bipolar disorder, currently in remission, most recent episode unspecified: Secondary | ICD-10-CM | POA: Diagnosis not present

## 2017-01-31 DIAGNOSIS — Z1389 Encounter for screening for other disorder: Secondary | ICD-10-CM | POA: Diagnosis not present

## 2017-01-31 DIAGNOSIS — F319 Bipolar disorder, unspecified: Secondary | ICD-10-CM | POA: Diagnosis not present

## 2017-02-11 ENCOUNTER — Encounter (HOSPITAL_COMMUNITY): Payer: Self-pay | Admitting: Emergency Medicine

## 2017-02-11 ENCOUNTER — Emergency Department (HOSPITAL_COMMUNITY): Payer: Medicare Other

## 2017-02-11 ENCOUNTER — Emergency Department (HOSPITAL_COMMUNITY)
Admission: EM | Admit: 2017-02-11 | Discharge: 2017-02-11 | Disposition: A | Discharge status: Home / Self Care | Payer: Medicare Other | Attending: Emergency Medicine | Admitting: Emergency Medicine

## 2017-02-11 ENCOUNTER — Other Ambulatory Visit: Payer: Self-pay

## 2017-02-11 DIAGNOSIS — R531 Weakness: Secondary | ICD-10-CM | POA: Diagnosis not present

## 2017-02-11 DIAGNOSIS — R509 Fever, unspecified: Secondary | ICD-10-CM | POA: Insufficient documentation

## 2017-02-11 DIAGNOSIS — R05 Cough: Secondary | ICD-10-CM | POA: Diagnosis not present

## 2017-02-11 DIAGNOSIS — R600 Localized edema: Secondary | ICD-10-CM | POA: Insufficient documentation

## 2017-02-11 DIAGNOSIS — E049 Nontoxic goiter, unspecified: Secondary | ICD-10-CM | POA: Diagnosis not present

## 2017-02-11 DIAGNOSIS — J181 Lobar pneumonia, unspecified organism: Secondary | ICD-10-CM | POA: Insufficient documentation

## 2017-02-11 DIAGNOSIS — J189 Pneumonia, unspecified organism: Secondary | ICD-10-CM

## 2017-02-11 DIAGNOSIS — R5383 Other fatigue: Secondary | ICD-10-CM | POA: Diagnosis not present

## 2017-02-11 DIAGNOSIS — R0981 Nasal congestion: Secondary | ICD-10-CM | POA: Diagnosis not present

## 2017-02-11 DIAGNOSIS — Z79899 Other long term (current) drug therapy: Secondary | ICD-10-CM | POA: Insufficient documentation

## 2017-02-11 HISTORY — DX: Disorder of thyroid, unspecified: E07.9

## 2017-02-11 LAB — BASIC METABOLIC PANEL
Anion gap: 12 (ref 5–15)
BUN: 20 mg/dL (ref 6–20)
CALCIUM: 8.4 mg/dL — AB (ref 8.9–10.3)
CO2: 22 mmol/L (ref 22–32)
CREATININE: 1.2 mg/dL (ref 0.61–1.24)
Chloride: 103 mmol/L (ref 101–111)
GFR calc Af Amer: >60 mL/min (ref >60)
Glucose, Bld: 163 mg/dL — ABNORMAL HIGH (ref 65–99)
Potassium: 4 mmol/L (ref 3.5–5.1)
Sodium: 137 mmol/L (ref 135–145)

## 2017-02-11 LAB — CBC WITH DIFFERENTIAL/PLATELET
BASOS ABS: 0 10*3/uL (ref 0.0–0.1)
BASOS PCT: 0 %
EOS ABS: 0 10*3/uL (ref 0.0–0.7)
EOS PCT: 0 %
HCT: 43 % (ref 39.0–52.0)
Hemoglobin: 14 g/dL (ref 13.0–17.0)
Lymphocytes Relative: 3 %
Lymphs Abs: 0.3 10*3/uL — ABNORMAL LOW (ref 0.7–4.0)
MCH: 32 pg (ref 26.0–34.0)
MCHC: 32.6 g/dL (ref 30.0–36.0)
MCV: 98.2 fL (ref 78.0–100.0)
Monocytes Absolute: 0.3 10*3/uL (ref 0.1–1.0)
Monocytes Relative: 2 %
Neutro Abs: 12 10*3/uL — ABNORMAL HIGH (ref 1.7–7.7)
Neutrophils Relative %: 95 %
PLATELETS: 257 10*3/uL (ref 150–400)
RBC: 4.38 MIL/uL (ref 4.22–5.81)
RDW: 13.6 % (ref 11.5–15.5)
WBC: 12.7 10*3/uL — AB (ref 4.0–10.5)

## 2017-02-11 LAB — BRAIN NATRIURETIC PEPTIDE: B NATRIURETIC PEPTIDE 5: 128 pg/mL — AB (ref 0.0–100.0)

## 2017-02-11 LAB — TROPONIN I

## 2017-02-11 MED ORDER — ACETAMINOPHEN 325 MG PO TABS
650.0000 mg | ORAL_TABLET | Freq: Once | ORAL | Status: AC
  Administered 2017-02-11: 650 mg via ORAL
  Filled 2017-02-11: qty 2

## 2017-02-11 MED ORDER — AZITHROMYCIN 500 MG PO TABS: ORAL_TABLET | ORAL | 0 refills | Status: DC

## 2017-02-11 MED ORDER — DEXTROSE 5 % IV SOLN
500.0000 mg | INTRAVENOUS | Status: DC
  Administered 2017-02-11: 500 mg via INTRAVENOUS
  Filled 2017-02-11: qty 500

## 2017-02-11 MED ORDER — DEXTROSE 5 % IV SOLN
1.0000 g | Freq: Once | INTRAVENOUS | Status: AC
  Administered 2017-02-11: 1 g via INTRAVENOUS
  Filled 2017-02-11: qty 10

## 2017-02-11 MED ORDER — SODIUM CHLORIDE 0.9 % IV BOLUS (SEPSIS)
500.0000 mL | Freq: Once | INTRAVENOUS | Status: AC
  Administered 2017-02-11: 500 mL via INTRAVENOUS

## 2017-02-11 NOTE — ED Notes (Signed)
PA notified of fever, tylenol ordered.

## 2017-02-11 NOTE — ED Provider Notes (Signed)
[facility] EMERGENCY DEPARTMENT Provider Note   CSN: [id number] Arrival date & time: 02/11/17  0845     History   Chief Complaint Chief Complaint  Patient presents with  . Cough    HPI Ricky Ward is a 66 y.o. male.  HPI   Ricky Ward is a 66 y.o. male who resides at a local assisted living facility, who presents to the Emergency Department complaining of productive cough since last evening.  Reported low grade fever this morning (100.5 orally), generalized weakness and fatigue.  Cough has been productive of yellow to brown sputum.  He has not taken any medications prior to arrival.  He denies chest pain, shortness of breath, hemoptysis, and peripheral edema.     Past Medical History:  Diagnosis Date  . Bipolar 1 disorder ([place])   . Thyroid disease     There are no active problems to display for this patient.   Past Surgical History:  Procedure Laterality Date  . FOOT SURGERY  08/23/2003       Home Medications    Prior to Admission medications   Medication Sig Start Date End Date Taking? Authorizing Provider  acyclovir (ZOVIRAX) 200 MG capsule Take 200 mg by mouth every 6 (six) hours.    [provider]  ALPRAZolam Duanne Moron) 1 MG tablet Take 1 mg by mouth at bedtime as needed for anxiety or sleep.    [provider]  clotrimazole-betamethasone (LOTRISONE) cream Apply to affected area 2 times daily prn Patient not taking: Reported on 05/25/2014 03/27/14   Billy Fischer, MD  LEVOTHYROXINE SODIUM PO Take 0.075 mg by mouth daily.     [provider]  nystatin cream (MYCOSTATIN) Apply 1 application topically daily.    [provider]  QUEtiapine (SEROQUEL) 50 MG tablet Take 50 mg by mouth at bedtime.    [provider]  zolpidem (AMBIEN) 5 MG tablet Take 1 tablet (5 mg total) by mouth at bedtime as needed for sleep. Patient not taking: Reported on 05/25/2014 03/20/14   Melony Overly, MD    Family History History  reviewed. No pertinent family history.  Social History Social History   Tobacco Use  . Smoking status: Never Smoker  . Smokeless tobacco: Never Used  Substance Use Topics  . Alcohol use: No  . Drug use: No     Allergies   Penicillins   Review of Systems Review of Systems  Constitutional: Positive for fever. Negative for appetite change and chills.  HENT: Positive for congestion. Negative for sore throat and trouble swallowing.   Respiratory: Positive for cough. Negative for chest tightness, shortness of breath and wheezing.   Cardiovascular: Positive for leg swelling. Negative for chest pain.  Gastrointestinal: Negative for abdominal pain, nausea and vomiting.  Genitourinary: Negative for dysuria.  Musculoskeletal: Positive for myalgias. Negative for arthralgias.  Skin: Negative for rash.  Neurological: Negative for dizziness, weakness (generalized weakness) and numbness.  Hematological: Negative for adenopathy.  All other systems reviewed and are negative.    Physical Exam Updated Vital Signs BP (!) 143/92 (BP Location: Left Arm)   Pulse (!) 130   Temp 99.1 F (37.3 C) (Oral)   Resp 20   Ht 5\' 7"  (1.702 m)   Wt 74.4 kg (164 lb)   SpO2 95%   BMI 25.69 kg/m   Physical Exam  Constitutional: He is oriented to person, place, and time. He appears well-developed and well-nourished. No distress.  HENT:  Head: Normocephalic and  atraumatic.  Right Ear: Tympanic membrane and ear canal normal.  Left Ear: Tympanic membrane and ear canal normal.  Mouth/Throat: Uvula is midline, oropharynx is clear and moist and mucous membranes are normal. No oropharyngeal exudate.  Eyes: EOM are normal. Pupils are equal, round, and reactive to light.  Neck: Normal range of motion, full passive range of motion without pain and phonation normal. Neck supple. Thyromegaly (thyroid enlarged, more prominent on left) present.  Cardiovascular: Regular rhythm, normal heart sounds and intact distal  pulses. Tachycardia present.  No murmur heard. Pulmonary/Chest: Effort normal. No stridor. No respiratory distress. He has no rales. He exhibits no tenderness.  Few crackles at right base. No wheezes.  Pt able to speak in clear, full sentences w/o distress  Musculoskeletal: He exhibits no edema or tenderness.  1+ peripheral edema of BLE's  Lymphadenopathy:    He has no cervical adenopathy.  Neurological: He is alert and oriented to person, place, and time. He exhibits normal muscle tone. Coordination normal.  Skin: Skin is warm and dry. Capillary refill takes less than 2 seconds.  Nursing note and vitals reviewed.    ED Treatments / Results  Labs (all labs ordered are listed, but only abnormal results are displayed) Labs Reviewed  BASIC METABOLIC PANEL - Abnormal; Notable for the following components:      Result Value   Glucose, Bld 163 (*)    Calcium 8.4 (*)    All other components within normal limits  CBC WITH DIFFERENTIAL/PLATELET - Abnormal; Notable for the following components:   WBC 12.7 (*)    Neutro Abs 12.0 (*)    Lymphs Abs 0.3 (*)    All other components within normal limits  BRAIN NATRIURETIC PEPTIDE - Abnormal; Notable for the following components:   B Natriuretic Peptide 128.0 (*)    All other components within normal limits  TROPONIN I    EKG  EKG Interpretation  Date/Time:  Monday February 11 2017 09:55:57 EST Ventricular Rate:  101 PR Interval:    QRS Duration: 89 QT Interval:  334 QTC Calculation: 433 R Axis:   31 Text Interpretation:  Sinus tachycardia RSR' in V1 or V2, right VCD or RVH Borderline T abnormalities, anterior leads Confirmed by Isla Pence [phone]) on 02/11/2017 10:23:53 AM       Radiology Dg Chest 2 View  Result Date: 02/11/2017 CLINICAL DATA:  Productive cough and fever since last night EXAM: CHEST  2 VIEW COMPARISON:  05/25/2014 FINDINGS: Upper normal heart size. Moderate to large hiatal hernia. Atherosclerotic  calcification aorta. Pulmonary vascularity normal. Bronchitic changes with RIGHT middle lobe infiltrate consistent with pneumonia. Remaining lungs clear. No pleural effusion or pneumothorax. Bones demineralized. IMPRESSION: Moderate to large hiatal hernia. Bronchitic changes with RIGHT middle lobe pneumonia. Electronically Signed   By: Lavonia Dana M.D.   On: 02/11/2017 09:55    Procedures Procedures (including critical care time)  Medications Ordered in ED Medications  sodium chloride 0.9 % bolus 500 mL (0 mLs Intravenous Stopped 02/11/17 1149)  cefTRIAXone (ROCEPHIN) 1 g in dextrose 5 % 50 mL IVPB (0 g Intravenous Stopped 02/11/17 1111)  acetaminophen (TYLENOL) tablet 650 mg (650 mg Oral Given 02/11/17 1345)     Initial Impression / Assessment and Plan / ED Course  I have reviewed the triage vital signs and the nursing notes.  Pertinent labs & imaging results that were available during my care of the patient were reviewed by me and considered in my medical decision making (see chart for  details).    Tachycardia on presentation, improved after IVF's.   Pt is well appearing.  Non-toxic.  No further tachycardia. No tachypnea or hypoxia.  CXR shows PNA, no concerning sx's for sepsis.  Pt also seen by Dr. Gilford Raid and care plan discussed.    Treated with zithromax and rocephin.  Pt reports feeling better.  Pt stable for outpatient treatment. Agrees to tx plan and close f/u with PCP this week.  Return precautions discussed.      Final Clinical Impressions(s) / ED Diagnoses   Final diagnoses:  Community acquired pneumonia of right middle lobe of lung [facility])    ED Discharge Orders    None       Bufford Lope 02/11/17 2052    Isla Pence, MD 02/12/17 [phone]

## 2017-02-11 NOTE — ED Triage Notes (Signed)
Pt reports cough with brown sputum since last night. Per [facility] staff, pt had fever this am of 100.5. No tylenol or ibuprofen administered prior to arrival. Pt denies pain but reports generalized weakness.

## 2017-02-11 NOTE — Discharge Instructions (Signed)
Drink plenty of fluids.  Tylenol every 4 hrs if needed for fever.  You will need to follow-up with your doctor later this week for recheck. Return here for any worsening symptoms

## 2017-03-27 DIAGNOSIS — L219 Seborrheic dermatitis, unspecified: Secondary | ICD-10-CM | POA: Diagnosis not present

## 2017-03-27 DIAGNOSIS — L409 Psoriasis, unspecified: Secondary | ICD-10-CM | POA: Diagnosis not present

## 2017-04-02 DIAGNOSIS — B351 Tinea unguium: Secondary | ICD-10-CM | POA: Diagnosis not present

## 2017-04-02 DIAGNOSIS — M79675 Pain in left toe(s): Secondary | ICD-10-CM | POA: Diagnosis not present

## 2017-04-02 DIAGNOSIS — M79674 Pain in right toe(s): Secondary | ICD-10-CM | POA: Diagnosis not present

## 2017-04-04 DIAGNOSIS — N289 Disorder of kidney and ureter, unspecified: Secondary | ICD-10-CM | POA: Diagnosis not present

## 2017-04-04 DIAGNOSIS — R946 Abnormal results of thyroid function studies: Secondary | ICD-10-CM | POA: Diagnosis not present

## 2017-05-09 DIAGNOSIS — F3170 Bipolar disorder, currently in remission, most recent episode unspecified: Secondary | ICD-10-CM | POA: Diagnosis not present

## 2017-05-09 DIAGNOSIS — E038 Other specified hypothyroidism: Secondary | ICD-10-CM | POA: Diagnosis not present

## 2017-06-04 DIAGNOSIS — M79675 Pain in left toe(s): Secondary | ICD-10-CM | POA: Diagnosis not present

## 2017-06-04 DIAGNOSIS — B351 Tinea unguium: Secondary | ICD-10-CM | POA: Diagnosis not present

## 2017-06-04 DIAGNOSIS — M79674 Pain in right toe(s): Secondary | ICD-10-CM | POA: Diagnosis not present

## 2017-07-04 DIAGNOSIS — H524 Presbyopia: Secondary | ICD-10-CM | POA: Diagnosis not present

## 2017-07-04 DIAGNOSIS — H25813 Combined forms of age-related cataract, bilateral: Secondary | ICD-10-CM | POA: Diagnosis not present

## 2017-07-23 DIAGNOSIS — L0100 Impetigo, unspecified: Secondary | ICD-10-CM | POA: Diagnosis not present

## 2017-07-23 DIAGNOSIS — L409 Psoriasis, unspecified: Secondary | ICD-10-CM | POA: Diagnosis not present

## 2017-07-23 DIAGNOSIS — L219 Seborrheic dermatitis, unspecified: Secondary | ICD-10-CM | POA: Diagnosis not present

## 2017-07-29 DIAGNOSIS — H524 Presbyopia: Secondary | ICD-10-CM | POA: Diagnosis not present

## 2017-08-06 DIAGNOSIS — M79675 Pain in left toe(s): Secondary | ICD-10-CM | POA: Diagnosis not present

## 2017-08-06 DIAGNOSIS — M79674 Pain in right toe(s): Secondary | ICD-10-CM | POA: Diagnosis not present

## 2017-08-06 DIAGNOSIS — B351 Tinea unguium: Secondary | ICD-10-CM | POA: Diagnosis not present

## 2017-08-08 DIAGNOSIS — E038 Other specified hypothyroidism: Secondary | ICD-10-CM | POA: Diagnosis not present

## 2017-08-08 DIAGNOSIS — F3170 Bipolar disorder, currently in remission, most recent episode unspecified: Secondary | ICD-10-CM | POA: Diagnosis not present

## 2017-08-08 DIAGNOSIS — K5900 Constipation, unspecified: Secondary | ICD-10-CM | POA: Diagnosis not present

## 2017-08-08 DIAGNOSIS — L309 Dermatitis, unspecified: Secondary | ICD-10-CM | POA: Diagnosis not present

## 2017-09-23 DIAGNOSIS — L409 Psoriasis, unspecified: Secondary | ICD-10-CM | POA: Diagnosis not present

## 2017-09-23 DIAGNOSIS — L570 Actinic keratosis: Secondary | ICD-10-CM | POA: Diagnosis not present

## 2017-09-23 DIAGNOSIS — L219 Seborrheic dermatitis, unspecified: Secondary | ICD-10-CM | POA: Diagnosis not present

## 2017-10-05 NOTE — Progress Notes (Signed)
Psychiatric Initial Adult Assessment   Patient Identification: Ricky Ward MRN:  [id number] Date of Evaluation:  10/09/2017 Referral Source: Dr. Gary Fleet, MD Chief Complaint:   Chief Complaint    Other; Psychiatric Evaluation    "I don't know." Visit Diagnosis:    ICD-10-CM   1. Bipolar disorder in full remission, most recent episode unspecified type ([place]) F31.70 Lithium level    Basic Metabolic Panel (BMET)    TSH  2. 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 year old male with a history of bipolar I disorder by history, hypothyroidism, who is referred for bipolar disorder.   Patient states that he does not know why he is here, but he was recommended to come here by people in [facility]. He has been in [facility] for the past 3 years.  Although he states that it was initially difficult for him (does not elaborate it), he feels fine lately. He states that the place is "not special, but okay." He does "nothing" regularly. He stays in the room most of the time. He reports good relationship with his roommate. He tends to have little patience on videogames and he does not do it so often. He likes to listen to country and gospel music. His sister visits him every week and brings clothes for him. He may go out with her at times. He has estranged relationship with his children.   He denies any significant mood symptoms. He has occasional insomnia due to frequent urination, but sleeps 10 hours. He feels mildly tired. He denies SI. He denies anxiety. He has good appetite. He denies decreased need for sleep, euphoria. When he is asked about AH, he reports "I don't see voices," "I will pretend like that." He then reports that "Drinking caused me that long." When he is asked about paranoia, he reports it is "slow process to get back to where I am, I'm in total remission." "When I drink, I think like that or something."  He denies SI, HI. He recalls he was admitted to [facility], although he is unsure of its reason. [facility] denies any [facility] He used to drink about eight beers (does not tell specific amount). He used to use marijuana at times years ago.   Wt Readings from Last 3 Encounters:  10/09/17 167 lb (75.8 kg)  02/11/17 164 lb (74.4 kg)  05/25/14 113 lb (51.3 kg)   Associated Signs/Symptoms: Depression Symptoms:  denies depressed  (Hypo) Manic Symptoms:  denies decreased need for sleep, euphoria Anxiety Symptoms:  denies anxiety Psychotic Symptoms:  denies AH, VH, paranoia PTSD Symptoms: Negative  Past Psychiatric History:  Outpatient: [facility] Psychiatry admission:  alcohol use at [facility] in [place] in [place], [place] in 2016 for depression, (diagnosed with bipolar)  Previous suicide attempt: denies Past trials of medication:  History of violence: denies  Previous Psychotropic Medications: Yes   Substance Abuse History in the last 12 months:  No.  Consequences of Substance Abuse: NA  Past Medical History:  Past Medical History:  Diagnosis Date  . Bipolar 1 disorder ([place])   . Thyroid disease     Past Surgical History:  Procedure Laterality Date  . FOOT SURGERY  08/23/2003    Family Psychiatric History:  Four brothers- alcohol use  Family History:  Family History  Problem Relation Age of  Onset  . Alcohol abuse Brother     Social History:   Social History   Socioeconomic History  . Marital status: Divorced    Spouse name: Not on file  . Number of children: Not on file  . Years of education: Not on file  . Highest education level: Not on file  Occupational History  . Not on file  Social Needs  . Financial resource strain: Not on file  . Food insecurity:    Worry: Not on file    Inability: Not on file  . Transportation needs:    Medical: Not on file    Non-medical: Not on file  Tobacco Use  . Smoking status:  Never Smoker  . Smokeless tobacco: Never Used  Substance and Sexual Activity  . Alcohol use: No  . Drug use: No  . Sexual activity: Not on file  Lifestyle  . Physical activity:    Days per week: Not on file    Minutes per session: Not on file  . Stress: Not on file  Relationships  . Social connections:    Talks on phone: Not on file    Gets together: Not on file    Attends religious service: Not on file    Active member of club or organization: Not on file    Attends meetings of clubs or organizations: Not on file    Relationship status: Not on file  Other Topics Concern  . Not on file  Social History Narrative  . 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 He has three children,  2 sons and 1 girl ([id number]), grandchildren  Allergies:   Allergies  Allergen Reactions  . Penicillins Swelling    Has patient had a PCN reaction causing immediate rash, facial/tongue/throat swelling, SOB or lightheadedness with hypotension: Unknown Has patient had a PCN reaction causing severe rash involving mucus membranes or skin necrosis: Unknown Has patient had a PCN reaction that required hospitalization: Unknown Has patient had a PCN reaction occurring within the last 10 years: No If all of the above answers are "NO", then may proceed with Cephalosporin use.     Metabolic Disorder Labs: No results found for: HGBA1C, MPG No results found for: PROLACTIN No results found for: CHOL, TRIG, HDL, CHOLHDL, VLDL, LDLCALC   Current Medications: Current Outpatient Medications  Medication Sig Dispense Refill  . acetaminophen (TYLENOL) 650 MG CR tablet Take 650 mg by mouth every 8 (eight) hours as needed for pain.    Marland Kitchen azithromycin (ZITHROMAX) 500 MG tablet One tab po qd x 5 days 5 tablet 0  . Calcipotriene-Betameth Diprop  (ENSTILAR) 0.005-0.064 % FOAM Apply 1 application topically daily as needed (for psoriazis).    . clotrimazole-betamethasone (LOTRISONE) cream Apply 1 application topically 2 (two) times daily as needed (rash).    Marland Kitchen dextromethorphan-guaiFENesin (QC TUSSIN DM COUGH/CONGESTION) 10-100 MG/5ML liquid Take 10 mLs by mouth every 6 (six) hours as needed for cough.    . dronabinol (MARINOL) 2.5 MG capsule Take 2.5 mg by mouth 2 (two) times daily before a meal.    . ketoconazole (NIZORAL) 2 % cream Apply 1 application topically 2 (two) times daily.    Marland Kitchen ketoconazole (NIZORAL) 2 % shampoo Apply 1 application topically daily.    Marland Kitchen LEVOTHYROXINE SODIUM PO Take 100 mcg by mouth daily.     Marland Kitchen  lithium carbonate 300 MG capsule Take 300 mg by mouth 2 (two) times daily with a meal.    . magnesium oxide (MAG-OX) 400 MG tablet Take 400 mg by mouth 2 (two) times daily.    Marland Kitchen PARoxetine (PAXIL) 20 MG tablet Take 20 mg by mouth daily.    . QUEtiapine (SEROQUEL) 400 MG tablet Take 400 mg by mouth at bedtime.    Marland Kitchen QUEtiapine (SEROQUEL) 50 MG tablet Take 50 mg by mouth at bedtime.    . sennosides-docusate sodium (SENOKOT-S) 8.6-50 MG tablet Take 1 tablet by mouth daily as needed for constipation.     No current facility-administered medications for this visit.     Neurologic: Headache: No Seizure: No Paresthesias:No  Musculoskeletal: Strength & Muscle Tone: within normal limits Gait & Station: unsteady Patient leans: N/A  Psychiatric Specialty Exam: Review of Systems  Psychiatric/Behavioral: Positive for memory loss. Negative for depression, hallucinations, substance abuse and suicidal ideas. The patient has insomnia. The patient is not nervous/anxious.   All other systems reviewed and are negative.   Blood pressure 114/80, pulse 76, height 5\' 7"  (1.702 m), weight 167 lb (75.8 kg), SpO2 95 %.Body mass index is 26.16 kg/m.  General Appearance: Fairly Groomed  Eye Contact:  Good  Speech:  Clear and Coherent,  mumbles at times  Volume:  Normal  Mood:  "good"  Affect:  Appropriate, Congruent and slightly restricted, calm  Thought Process:  Coherent, occasionally disorganized  Orientation:  Full (Time, Place, and Person)  Thought Content:  Logical  Suicidal Thoughts:  No  Homicidal Thoughts:  No  Memory:  Immediate;   Good  Judgement:  Good  Insight:  Present  Psychomotor Activity:  Normal  Concentration:  Concentration: Good and Attention Span: Good  Recall:  Good  Fund of Knowledge:Good  Language: Good  Akathisia:  No  Handed:  Right  AIMS (if indicated):  No rigidity. Coarse postural/kinetic tremors on bilateral hands  Assets:  Communication Skills Desire for Improvement  ADL's:  Intact  Cognition: WNL  Sleep:  fair   Labs: TSH 1.1, BMP Cre 1.4 in 01/2017. Lipid panels, elevations in TG, LDL. Glu 136  Assessment Ricky Ward is a 67 y.o. year old male with a history of bipolar I disorder by history, alcohol use disorder in sustained remission hypothyroidism, who is referred for bipolar disorder.   # Bipolar disorder by history in remission Patient demonstrates mostly linear thought process except occasional disorganization, and he denies significant mood symptoms. Will continue lithium, quetiapine for bipolar disorder and paroxetine for depression. Noted that he does have mild hand coarse tremors, although he is alert and oriented. Will obtain lithium level to rule out toxicity and adjust accordingly. Noted that most recent admission appear to be in 2016; it is difficult to discern if the patient was actually experiencing hypomanic episode, although the diagnosis was documented in the chart (he had insomnia, crying spells, helplessness, loss of appetite and lost 30 lbs in three months). He is advised to bring his sister for more collaterals.   # r/o cognitive impairment He may have some cognitive impairment given his occasional disorganization. Will evaluate with [facility] at the next  encounter.  Plan 1. Continue Quetiapine 50 mg in the morning, 400 mg at night 2. Continue lithium 300 mg twice a day  3. Continue Paroxetine 20 mg daily 4. Return to clinic in three months for 30 mins - Obtain labs (lithium, TSH, BMP)  The patient demonstrates the following risk factors  for suicide: Chronic risk factors for suicide include: psychiatric disorder of bipolar disorder. Acute risk factors for suicide include: unemployment and social withdrawal/isolation. Protective factors for this patient include: positive social support and hope for the future. Considering these factors, the overall suicide risk at this point appears to be low. Patient is appropriate for outpatient follow up.   Treatment Plan Summary: Plan as above   [name], MD 8/28/20199:09 AM

## 2017-10-08 DIAGNOSIS — B351 Tinea unguium: Secondary | ICD-10-CM | POA: Diagnosis not present

## 2017-10-08 DIAGNOSIS — M79675 Pain in left toe(s): Secondary | ICD-10-CM | POA: Diagnosis not present

## 2017-10-08 DIAGNOSIS — M79674 Pain in right toe(s): Secondary | ICD-10-CM | POA: Diagnosis not present

## 2017-10-09 ENCOUNTER — Ambulatory Visit (INDEPENDENT_AMBULATORY_CARE_PROVIDER_SITE_OTHER): Payer: Medicare Other | Admitting: Psychiatry

## 2017-10-09 ENCOUNTER — Encounter (HOSPITAL_COMMUNITY): Payer: Self-pay | Admitting: Psychiatry

## 2017-10-09 ENCOUNTER — Encounter (INDEPENDENT_AMBULATORY_CARE_PROVIDER_SITE_OTHER): Payer: Self-pay

## 2017-10-09 VITALS — BP 114/80 | HR 76 | Ht 67.0 in | Wt 167.0 lb

## 2017-10-09 DIAGNOSIS — F3170 Bipolar disorder, currently in remission, most recent episode unspecified: Secondary | ICD-10-CM | POA: Diagnosis not present

## 2017-10-09 DIAGNOSIS — F1011 Alcohol abuse, in remission: Secondary | ICD-10-CM

## 2017-10-09 NOTE — Patient Instructions (Signed)
1. Continue Quetiapine 50 mg in the morning, 400 mg at night 2. Continue lithium 300 mg twice a day  3. Continue Paroxetine 20 mg daily 4. Return to clinic in three months for 30 mins

## 2017-10-10 ENCOUNTER — Telehealth (HOSPITAL_COMMUNITY): Payer: Self-pay | Admitting: Psychiatry

## 2017-10-10 ENCOUNTER — Encounter (HOSPITAL_COMMUNITY): Payer: Self-pay | Admitting: Psychiatry

## 2017-10-10 LAB — TSH: TSH: 0.09 mIU/L — ABNORMAL LOW (ref 0.40–4.50)

## 2017-10-10 LAB — BASIC METABOLIC PANEL
BUN/Creatinine Ratio: 9 (calc) (ref 6–22)
BUN: 13 mg/dL (ref 7–25)
CALCIUM: 9.1 mg/dL (ref 8.6–10.3)
CHLORIDE: 105 mmol/L (ref 98–110)
CO2: 27 mmol/L (ref 20–32)
Creat: 1.47 mg/dL — ABNORMAL HIGH (ref 0.70–1.25)
Glucose, Bld: 115 mg/dL (ref 65–139)
POTASSIUM: 5.1 mmol/L (ref 3.5–5.3)
Sodium: 139 mmol/L (ref 135–146)

## 2017-10-10 LAB — LITHIUM LEVEL: LITHIUM LVL: 0.9 mmol/L (ref 0.6–1.2)

## 2017-10-10 NOTE — Telephone Encounter (Signed)
Please contact the patient/facility regarding his blood test. Although his lithium level is within good range, there is some abnormality in his thyroid test (TSH) and there is mild elevation in creatinine. I would recommend he decrease lithium to 300 mg at night (instead of 300 mg twice a day). Advise him to reschedule sooner than discussed, within 1-2 months instead of 3 months. Also advise him to follow up with his primary care regarding his thyroid. Will also send him a letter about his blood test.

## 2017-10-10 NOTE — Telephone Encounter (Signed)
DONE

## 2017-11-07 DIAGNOSIS — K5900 Constipation, unspecified: Secondary | ICD-10-CM | POA: Diagnosis not present

## 2017-11-07 DIAGNOSIS — F3170 Bipolar disorder, currently in remission, most recent episode unspecified: Secondary | ICD-10-CM | POA: Diagnosis not present

## 2017-11-07 DIAGNOSIS — E038 Other specified hypothyroidism: Secondary | ICD-10-CM | POA: Diagnosis not present

## 2017-11-07 DIAGNOSIS — L309 Dermatitis, unspecified: Secondary | ICD-10-CM | POA: Diagnosis not present

## 2017-11-13 DIAGNOSIS — Z23 Encounter for immunization: Secondary | ICD-10-CM | POA: Diagnosis not present

## 2017-11-22 DIAGNOSIS — L0100 Impetigo, unspecified: Secondary | ICD-10-CM | POA: Diagnosis not present

## 2017-11-25 DIAGNOSIS — L0100 Impetigo, unspecified: Secondary | ICD-10-CM | POA: Diagnosis not present

## 2017-11-25 DIAGNOSIS — L219 Seborrheic dermatitis, unspecified: Secondary | ICD-10-CM | POA: Diagnosis not present

## 2017-11-25 DIAGNOSIS — L409 Psoriasis, unspecified: Secondary | ICD-10-CM | POA: Diagnosis not present

## 2017-12-23 DIAGNOSIS — L409 Psoriasis, unspecified: Secondary | ICD-10-CM | POA: Diagnosis not present

## 2017-12-23 DIAGNOSIS — L0100 Impetigo, unspecified: Secondary | ICD-10-CM | POA: Diagnosis not present

## 2018-01-01 DIAGNOSIS — H50111 Monocular exotropia, right eye: Secondary | ICD-10-CM | POA: Diagnosis not present

## 2018-01-01 DIAGNOSIS — H53021 Refractive amblyopia, right eye: Secondary | ICD-10-CM | POA: Diagnosis not present

## 2018-01-01 DIAGNOSIS — H25813 Combined forms of age-related cataract, bilateral: Secondary | ICD-10-CM | POA: Diagnosis not present

## 2018-01-08 NOTE — Progress Notes (Signed)
[place] MD/PA/NP OP Progress Note  01/13/2018 2:05 PM Ricky Ward  MRN:  [id number]  Chief Complaint:  Chief Complaint    Depression; Follow-up; Other     HPI:  - at the previous visit, there was abnormality in labs (creatinine, TSH); although it was recommended to lower dose of lithium, it was not done per [facility]. Patient presents for follow-up appointment for bipolar disorder by history.  He states that he did not ask his sister to come to the other, although it was discussed at the previous visit.  When he is asked about his lithium, he states that "I can knock it down." He is not aware of any thyroid issues.  He denies any mood issues. He reports good relationship with his roommate.  He remembers he was diagnosed with bipolar disorder in the past, although he does not recollect any details.  He sleeps well.  He denies feeling depressed or anxious.  He denies SI.  He has VH of seeing "something, like cheese. They are going." He denies AH.   Visit Diagnosis:    ICD-10-CM   1. Mood disorder in conditions classified elsewhere F06.30 Lithium level    TSH    T4, free    Comprehensive Metabolic Panel (CMET)    Past Psychiatric History: Please see initial evaluation for full details. I have reviewed the history. No updates at this time.     Past Medical History:  Past Medical History:  Diagnosis Date  . Bipolar 1 disorder ([place])   . Thyroid disease     Past Surgical History:  Procedure Laterality Date  . FOOT SURGERY  08/23/2003    Family Psychiatric History: Please see initial evaluation for full details. I have reviewed the history. No updates at this time.     Family History:  Family History  Problem Relation Age of Onset  . Alcohol abuse Brother     Social History:  Social History   Socioeconomic History  . Marital status: Divorced    Spouse name: Not on file  . Number of children: Not on file  . Years of education: Not on file  . Highest education level: Not on file   Occupational History  . Not on file  Social Needs  . Financial resource strain: Not on file  . Food insecurity:    Worry: Not on file    Inability: Not on file  . Transportation needs:    Medical: Not on file    Non-medical: Not on file  Tobacco Use  . Smoking status: Never Smoker  . Smokeless tobacco: Never Used  Substance and Sexual Activity  . Alcohol use: No  . Drug use: No  . Sexual activity: Not on file  Lifestyle  . Physical activity:    Days per week: Not on file    Minutes per session: Not on file  . Stress: Not on file  Relationships  . Social connections:    Talks on phone: Not on file    Gets together: Not on file    Attends religious service: Not on file    Active member of club or organization: Not on file    Attends meetings of clubs or organizations: Not on file    Relationship status: Not on file  Other Topics Concern  . Not on file  Social History Narrative  . Not on file    Allergies:  Allergies  Allergen Reactions  . Penicillins Swelling    Has patient had a PCN  reaction causing immediate rash, facial/tongue/throat swelling, SOB or lightheadedness with hypotension: Unknown Has patient had a PCN reaction causing severe rash involving mucus membranes or skin necrosis: Unknown Has patient had a PCN reaction that required hospitalization: Unknown Has patient had a PCN reaction occurring within the last 10 years: No If all of the above answers are "NO", then may proceed with Cephalosporin use.     Metabolic Disorder Labs: No results found for: HGBA1C, MPG No results found for: PROLACTIN No results found for: CHOL, TRIG, HDL, CHOLHDL, VLDL, LDLCALC Lab Results  Component Value Date   TSH 0.09 (L) 10/09/2017    Therapeutic Level Labs: Lab Results  Component Value Date   LITHIUM 0.9 10/09/2017   No results found for: VALPROATE No components found for:  CBMZ  Current Medications: Current Outpatient Medications  Medication Sig Dispense  Refill  . acetaminophen (TYLENOL) 650 MG CR tablet Take 650 mg by mouth every 8 (eight) hours as needed for pain.    Marland Kitchen azithromycin (ZITHROMAX) 500 MG tablet One tab po qd x 5 days 5 tablet 0  . Calcipotriene-Betameth Diprop (ENSTILAR) 0.005-0.064 % FOAM Apply 1 application topically daily as needed (for psoriazis).    . clotrimazole-betamethasone (LOTRISONE) cream Apply 1 application topically 2 (two) times daily as needed (rash).    Marland Kitchen dextromethorphan-guaiFENesin (QC TUSSIN DM COUGH/CONGESTION) 10-100 MG/5ML liquid Take 10 mLs by mouth every 6 (six) hours as needed for cough.    . dronabinol (MARINOL) 2.5 MG capsule Take 2.5 mg by mouth 2 (two) times daily before a meal.    . ketoconazole (NIZORAL) 2 % cream Apply 1 application topically 2 (two) times daily.    Marland Kitchen ketoconazole (NIZORAL) 2 % shampoo Apply 1 application topically daily.    Marland Kitchen LEVOTHYROXINE SODIUM PO Take 100 mcg by mouth daily.     Marland Kitchen lithium carbonate 300 MG capsule Take 300 mg by mouth 2 (two) times daily with a meal.    . magnesium oxide (MAG-OX) 400 MG tablet Take 400 mg by mouth 2 (two) times daily.    Marland Kitchen PARoxetine (PAXIL) 20 MG tablet Take 20 mg by mouth daily.    . QUEtiapine (SEROQUEL) 400 MG tablet Take 400 mg by mouth at bedtime.    Marland Kitchen QUEtiapine (SEROQUEL) 50 MG tablet Take 50 mg by mouth at bedtime.    . sennosides-docusate sodium (SENOKOT-S) 8.6-50 MG tablet Take 1 tablet by mouth daily as needed for constipation.     No current facility-administered medications for this visit.      Musculoskeletal: Strength & Muscle Tone: within normal limits Gait & Station: normal Patient leans: N/A  Psychiatric Specialty Exam: Review of Systems  Psychiatric/Behavioral: Positive for hallucinations. Negative for depression, memory loss, substance abuse and suicidal ideas. The patient is not nervous/anxious and does not have insomnia.   All other systems reviewed and are negative.   Blood pressure 125/71, pulse 86, height 5'  7" (1.702 m), weight 170 lb (77.1 kg), SpO2 96 %.Body mass index is 26.63 kg/m.  General Appearance: Fairly Groomed  Eye Contact:  Good  Speech:  Clear and Coherent  Volume:  Normal  Mood:  "fine"  Affect:  Appropriate and Congruent  Thought Process:  Coherent, disorganized at times  Orientation:  Full (Time, Place, and Person)  Thought Content: Logical   Suicidal Thoughts:  No  Homicidal Thoughts:  No  Memory:  Immediate;   Good  Judgement:  Fair  Insight:  Shallow  Psychomotor Activity:  Normal  Concentration:  Concentration: Good and Attention Span: Good  Recall:  Good  Fund of Knowledge: Good  Language: Good  Akathisia:  No  Handed:  Right  AIMS (if indicated): not done  Assets:  Communication Skills Desire for Improvement  ADL's:  Intact  Cognition: WNL  Sleep:  Good   Screenings: MOCA 15/30 on 01/13/2018 -4 for visuospatial, -1 for naming, -2 for attention, -2 for language, -1 for abstraction. -5 for delayed recall  Assessment and Plan:  AMOL DOMANSKI is a 67 y.o. year old male with a history of bipolar I disorder by history,  alcohol use disorder in sustained remission hypothyroidism,, who presents for follow up appointment for Mood disorder in conditions classified elsewhere - Plan: Lithium level, TSH, T4, free, Comprehensive Metabolic Panel (CMET)  # Cognitive impairment Exam is notable for disorganization, and he does have cognitive deficits as characterized by MOCA. IADL level is unclear without collateral. He is advised to come for follow up with the staff.   # Bipolar disorder by history in remission Patient denies significant mood symptoms since the last appointment. Will continue quetiapine for bipolar disorder.  Will continue duloxetine for depression.  Will have lower dose of lithium given thyroid issues; will recheck the level again.  Noted that most recent admission appear to be in 2016; it is difficult to discern if the patient was actually experiencing  hypomanic episode, although bipolar diagnosis was documented in the chart (he had insomnia, crying spells, helplessness, loss of appetite and lost 30 lbs in three months). Will continue to monitor.   Plan I have reviewed and updated plans as below 1. Continue Quetiapine 50 mg in the morning, 400 mg at night 2. Decrease lithium 300 mg at night  3. Continue Paroxetine 20 mg daily 4. Return to clinic in three months for 30 mins - Obtain labs again (lithium, TSH, BMP)  The patient demonstrates the following risk factors for suicide: Chronic risk factors for suicide include: psychiatric disorder of bipolar disorder. Acute risk factors for suicide include: unemployment and social withdrawal/isolation. Protective factors for this patient include: positive social support and hope for the future. Considering these factors, the overall suicide risk at this point appears to be low. Patient is appropriate for outpatient follow up.  The duration of this appointment visit was 30 minutes of face-to-face time with the patient.  Greater than 50% of this time was spent in counseling, explanation of  diagnosis, planning of further management, and coordination of care.  [name], MD 01/13/2018, 2:05 PM

## 2018-01-13 ENCOUNTER — Ambulatory Visit (INDEPENDENT_AMBULATORY_CARE_PROVIDER_SITE_OTHER): Payer: Medicare Other | Admitting: Psychiatry

## 2018-01-13 ENCOUNTER — Encounter (HOSPITAL_COMMUNITY): Payer: Self-pay | Admitting: Psychiatry

## 2018-01-13 VITALS — BP 125/71 | HR 86 | Ht 67.0 in | Wt 170.0 lb

## 2018-01-13 DIAGNOSIS — F0630 Mood disorder due to known physiological condition, unspecified: Secondary | ICD-10-CM

## 2018-01-13 DIAGNOSIS — R4189 Other symptoms and signs involving cognitive functions and awareness: Secondary | ICD-10-CM | POA: Diagnosis not present

## 2018-01-13 NOTE — Patient Instructions (Signed)
1. Continue Quetiapine 50 mg in the morning, 400 mg at night 2. Decrease lithium 300 mg at night  3. Continue Paroxetine 20 mg daily 4. Return to clinic in three months for 30 mins 5. Obtain labs

## 2018-01-14 ENCOUNTER — Encounter (HOSPITAL_COMMUNITY): Payer: Self-pay | Admitting: Psychiatry

## 2018-01-14 DIAGNOSIS — M79675 Pain in left toe(s): Secondary | ICD-10-CM | POA: Diagnosis not present

## 2018-01-14 DIAGNOSIS — M79674 Pain in right toe(s): Secondary | ICD-10-CM | POA: Diagnosis not present

## 2018-01-14 DIAGNOSIS — B351 Tinea unguium: Secondary | ICD-10-CM | POA: Diagnosis not present

## 2018-01-14 LAB — COMPREHENSIVE METABOLIC PANEL
AG Ratio: 1.4 (calc) (ref 1.0–2.5)
ALKALINE PHOSPHATASE (APISO): 90 U/L (ref 40–115)
ALT: 12 U/L (ref 9–46)
AST: 21 U/L (ref 10–35)
Albumin: 4.1 g/dL (ref 3.6–5.1)
BILIRUBIN TOTAL: 0.5 mg/dL (ref 0.2–1.2)
BUN/Creatinine Ratio: 11 (calc) (ref 6–22)
BUN: 15 mg/dL (ref 7–25)
CALCIUM: 9.3 mg/dL (ref 8.6–10.3)
CO2: 31 mmol/L (ref 20–32)
Chloride: 103 mmol/L (ref 98–110)
Creat: 1.38 mg/dL — ABNORMAL HIGH (ref 0.70–1.25)
Globulin: 3 g/dL (calc) (ref 1.9–3.7)
Glucose, Bld: 105 mg/dL (ref 65–139)
Potassium: 4.4 mmol/L (ref 3.5–5.3)
Sodium: 139 mmol/L (ref 135–146)
Total Protein: 7.1 g/dL (ref 6.1–8.1)

## 2018-01-14 LAB — T4, FREE: Free T4: 0.7 ng/dL — ABNORMAL LOW (ref 0.8–1.8)

## 2018-01-14 LAB — TSH: TSH: 0.18 m[IU]/L — AB (ref 0.40–4.50)

## 2018-01-14 LAB — LITHIUM LEVEL: LITHIUM LVL: 1 mmol/L (ref 0.6–1.2)

## 2018-02-11 DIAGNOSIS — R739 Hyperglycemia, unspecified: Secondary | ICD-10-CM | POA: Diagnosis not present

## 2018-02-11 DIAGNOSIS — Z1389 Encounter for screening for other disorder: Secondary | ICD-10-CM | POA: Diagnosis not present

## 2018-02-11 DIAGNOSIS — L309 Dermatitis, unspecified: Secondary | ICD-10-CM | POA: Diagnosis not present

## 2018-02-11 DIAGNOSIS — F3170 Bipolar disorder, currently in remission, most recent episode unspecified: Secondary | ICD-10-CM | POA: Diagnosis not present

## 2018-02-11 DIAGNOSIS — E038 Other specified hypothyroidism: Secondary | ICD-10-CM | POA: Diagnosis not present

## 2018-02-11 DIAGNOSIS — K5900 Constipation, unspecified: Secondary | ICD-10-CM | POA: Diagnosis not present

## 2018-02-11 DIAGNOSIS — Z1331 Encounter for screening for depression: Secondary | ICD-10-CM | POA: Diagnosis not present

## 2018-02-11 DIAGNOSIS — F319 Bipolar disorder, unspecified: Secondary | ICD-10-CM | POA: Diagnosis not present

## 2018-02-11 DIAGNOSIS — Z0000 Encounter for general adult medical examination without abnormal findings: Secondary | ICD-10-CM | POA: Diagnosis not present

## 2018-02-18 ENCOUNTER — Encounter: Payer: Self-pay | Admitting: Gastroenterology

## 2018-02-25 DIAGNOSIS — L0100 Impetigo, unspecified: Secondary | ICD-10-CM | POA: Diagnosis not present

## 2018-02-25 DIAGNOSIS — L219 Seborrheic dermatitis, unspecified: Secondary | ICD-10-CM | POA: Diagnosis not present

## 2018-02-25 DIAGNOSIS — L409 Psoriasis, unspecified: Secondary | ICD-10-CM | POA: Diagnosis not present

## 2018-02-26 ENCOUNTER — Other Ambulatory Visit (HOSPITAL_COMMUNITY): Payer: Self-pay | Admitting: Psychiatry

## 2018-02-26 ENCOUNTER — Telehealth (HOSPITAL_COMMUNITY): Payer: Self-pay | Admitting: *Deleted

## 2018-02-26 MED ORDER — PAROXETINE HCL 20 MG PO TABS: 20.0000 mg | ORAL_TABLET | Freq: Every day | ORAL | 0 refills | Status: DC

## 2018-02-26 MED ORDER — QUETIAPINE FUMARATE 50 MG PO TABS: 50.0000 mg | ORAL_TABLET | Freq: Every day | ORAL | 0 refills | Status: DC

## 2018-02-26 MED ORDER — LITHIUM CARBONATE 300 MG PO CAPS: 300.0000 mg | ORAL_CAPSULE | Freq: Two times a day (BID) | ORAL | 0 refills | Status: DC

## 2018-02-26 MED ORDER — QUETIAPINE FUMARATE 400 MG PO TABS: 400.0000 mg | ORAL_TABLET | Freq: Every day | ORAL | 0 refills | Status: DC

## 2018-02-26 NOTE — Telephone Encounter (Signed)
sent 

## 2018-02-26 NOTE — Telephone Encounter (Signed)
Dr Harrington Challenger   Dr Modesta Messing patient [facility] called for refills on the following: Lithium 300 mg,  Paxil 20 mg,  Seroquel 400 mg,  Seroquel 50 mg. Next Visit 04/15/2018

## 2018-02-27 ENCOUNTER — Telehealth (HOSPITAL_COMMUNITY): Payer: Self-pay | Admitting: *Deleted

## 2018-02-27 ENCOUNTER — Other Ambulatory Visit (HOSPITAL_COMMUNITY): Payer: Self-pay | Admitting: Psychiatry

## 2018-02-27 MED ORDER — LITHIUM CARBONATE 300 MG PO CAPS: 300.0000 mg | ORAL_CAPSULE | Freq: Every day | ORAL | 0 refills | Status: DC

## 2018-02-27 NOTE — Telephone Encounter (Signed)
Dr Harrington Challenger  Dr Hisada's patient  The Rx called stating that the directions sent on the med's yesterday were different wording & requested a new scripts previously ordered :  1) Quetiapine 50 mg in the morning, 400 mg at night 2. Decrease lithium 300 mg at night

## 2018-02-27 NOTE — Telephone Encounter (Signed)
seroquel I sent is the same, I changed the lithium to one at bedtime

## 2018-03-18 DIAGNOSIS — M79675 Pain in left toe(s): Secondary | ICD-10-CM | POA: Diagnosis not present

## 2018-03-18 DIAGNOSIS — M79674 Pain in right toe(s): Secondary | ICD-10-CM | POA: Diagnosis not present

## 2018-03-18 DIAGNOSIS — B351 Tinea unguium: Secondary | ICD-10-CM | POA: Diagnosis not present

## 2018-03-31 ENCOUNTER — Other Ambulatory Visit (HOSPITAL_COMMUNITY): Payer: Self-pay | Admitting: Psychiatry

## 2018-03-31 MED ORDER — LITHIUM CARBONATE 300 MG PO CAPS: 300.0000 mg | ORAL_CAPSULE | Freq: Every day | ORAL | 0 refills | Status: DC

## 2018-03-31 MED ORDER — QUETIAPINE FUMARATE 50 MG PO TABS: 50.0000 mg | ORAL_TABLET | Freq: Every day | ORAL | 0 refills | Status: DC

## 2018-03-31 MED ORDER — QUETIAPINE FUMARATE 400 MG PO TABS: 400.0000 mg | ORAL_TABLET | Freq: Every day | ORAL | 0 refills | Status: DC

## 2018-03-31 MED ORDER — PAROXETINE HCL 20 MG PO TABS: 20.0000 mg | ORAL_TABLET | Freq: Every day | ORAL | 0 refills | Status: DC

## 2018-04-09 NOTE — Progress Notes (Signed)
[facility] MD/PA/NP OP Progress Note  04/15/2018 2:41 PM Ricky Ward  MRN:  [id number]  Chief Complaint:  Chief Complaint    Follow-up; Other     HPI:  Patient presents for follow-up appointment for bipolar disorder and cognitive impairment.  He presents to the appointment by himself.  He states that he has been doing all right.  Although he is to be more anxious, he does not feel that way anymore.  When he is asked about his daily routine, he answers that "If I come to your place, I may stay there for a while." He states that he used to play bingo, and then talks about his brother.  He sleeps well.  He occasionally feels depressed.  He has fair motivation and appetite.  He denies SI.  He denies hallucinations or HI. He denies decreased need for sleep, euphoria.  Contacted [facility]. Discussed with Butch Penny.  Patient has been taking medication regularly. There has been no change in his thyroid medication. He has been doing very well, and no significant concern.   Visit Diagnosis:    ICD-10-CM   1. Mood disorder in conditions classified elsewhere F06.30   2. Cognitive impairment R41.89     Past Psychiatric History: Please see initial evaluation for full details. I have reviewed the history. No updates at this time.     Past Medical History:  Past Medical History:  Diagnosis Date  . Bipolar 1 disorder ([place])   . Thyroid disease     Past Surgical History:  Procedure Laterality Date  . FOOT SURGERY  08/23/2003    Family Psychiatric History: Please see initial evaluation for full details. I have reviewed the history. No updates at this time.     Family History:  Family History  Problem Relation Age of Onset  . Alcohol abuse Brother     Social History:  Social History   Socioeconomic History  . Marital status: Divorced    Spouse name: Not on file  . Number of children: Not on file  . Years of education: Not on file  . Highest education level: Not on file  Occupational History   . Not on file  Social Needs  . Financial resource strain: Not on file  . Food insecurity:    Worry: Not on file    Inability: Not on file  . Transportation needs:    Medical: Not on file    Non-medical: Not on file  Tobacco Use  . Smoking status: Never Smoker  . Smokeless tobacco: Never Used  Substance and Sexual Activity  . Alcohol use: No  . Drug use: No  . Sexual activity: Not on file  Lifestyle  . Physical activity:    Days per week: Not on file    Minutes per session: Not on file  . Stress: Not on file  Relationships  . Social connections:    Talks on phone: Not on file    Gets together: Not on file    Attends religious service: Not on file    Active member of club or organization: Not on file    Attends meetings of clubs or organizations: Not on file    Relationship status: Not on file  Other Topics Concern  . Not on file  Social History Narrative  . Not on file    Allergies:  Allergies  Allergen Reactions  . Penicillins Swelling    Has patient had a PCN reaction causing immediate rash, facial/tongue/throat swelling, SOB or lightheadedness with  hypotension: Unknown Has patient had a PCN reaction causing severe rash involving mucus membranes or skin necrosis: Unknown Has patient had a PCN reaction that required hospitalization: Unknown Has patient had a PCN reaction occurring within the last 10 years: No If all of the above answers are "NO", then may proceed with Cephalosporin use.     Metabolic Disorder Labs: No results found for: HGBA1C, MPG No results found for: PROLACTIN No results found for: CHOL, TRIG, HDL, CHOLHDL, VLDL, LDLCALC Lab Results  Component Value Date   TSH 0.18 (L) 01/13/2018   TSH 0.09 (L) 10/09/2017    Therapeutic Level Labs: Lab Results  Component Value Date   LITHIUM 1.0 01/13/2018   LITHIUM 0.9 10/09/2017   No results found for: VALPROATE No components found for:  CBMZ  Current Medications: Current Outpatient  Medications  Medication Sig Dispense Refill  . acetaminophen (TYLENOL) 650 MG CR tablet Take 650 mg by mouth every 8 (eight) hours as needed for pain.    Marland Kitchen azithromycin (ZITHROMAX) 500 MG tablet One tab po qd x 5 days 5 tablet 0  . Calcipotriene-Betameth Diprop (ENSTILAR) 0.005-0.064 % FOAM Apply 1 application topically daily as needed (for psoriazis).    . clotrimazole-betamethasone (LOTRISONE) cream Apply 1 application topically 2 (two) times daily as needed (rash).    Marland Kitchen dextromethorphan-guaiFENesin (QC TUSSIN DM COUGH/CONGESTION) 10-100 MG/5ML liquid Take 10 mLs by mouth every 6 (six) hours as needed for cough.    . dronabinol (MARINOL) 2.5 MG capsule Take 2.5 mg by mouth 2 (two) times daily before a meal.    . ketoconazole (NIZORAL) 2 % cream Apply 1 application topically 2 (two) times daily.    Marland Kitchen ketoconazole (NIZORAL) 2 % shampoo Apply 1 application topically daily.    Marland Kitchen LEVOTHYROXINE SODIUM PO Take 100 mcg by mouth daily.     Derrill Memo ON 04/29/2018] lithium carbonate 300 MG capsule Take 1 capsule (300 mg total) by mouth at bedtime. 90 capsule 0  . magnesium oxide (MAG-OX) 400 MG tablet Take 400 mg by mouth 2 (two) times daily.    Derrill Memo ON 04/29/2018] PARoxetine (PAXIL) 20 MG tablet Take 1 tablet (20 mg total) by mouth daily. 90 tablet 0  . QUEtiapine (SEROQUEL) 400 MG tablet Take 1 tablet (400 mg total) by mouth at bedtime. 90 tablet 0  . [START ON 04/29/2018] QUEtiapine (SEROQUEL) 50 MG tablet Take 1 tablet (50 mg total) by mouth at bedtime. 90 tablet 0  . sennosides-docusate sodium (SENOKOT-S) 8.6-50 MG tablet Take 1 tablet by mouth daily as needed for constipation.     No current facility-administered medications for this visit.      Musculoskeletal: Strength & Muscle Tone: within normal limits Gait & Station: normal Patient leans: N/A  Psychiatric Specialty Exam: Review of Systems  Psychiatric/Behavioral: Positive for depression. Negative for hallucinations, memory loss,  substance abuse and suicidal ideas. The patient is nervous/anxious. The patient does not have insomnia.   All other systems reviewed and are negative.   Blood pressure 118/68, pulse 93, height 5\' 7"  (1.702 m), weight 175 lb (79.4 kg), SpO2 97 %.Body mass index is 27.41 kg/m.  General Appearance: Fairly Groomed  Eye Contact:  Good  Speech:  Clear and Coherent  Volume:  Normal  Mood:  "good"  Affect:  Appropriate, Congruent and calm  Thought Process:  Coherent  Orientation:  Full (Time, Place, and Person)  Thought Content: Logical   Suicidal Thoughts:  No  Homicidal Thoughts:  No  Memory:  Immediate;   Good  Judgement:  Fair  Insight:  Shallow  Psychomotor Activity:  Normal  Concentration:  Concentration: Good and Attention Span: Good  Recall:  Good  Fund of Knowledge: Good  Language: Good  Akathisia:  No  Handed:  Right  AIMS (if indicated): not done  Assets:  Communication Skills Desire for Improvement  ADL's:  Intact  Cognition: WNL  Sleep:  Fair   Screenings:   Assessment and Plan:  Ricky Ward is a 68 y.o. year old male with a history of bipolar I disorder by history, alcohol use disorder in sustained remission, hypothyroidism , who presents for follow up appointment for Mood disorder in conditions classified elsewhere  Cognitive impairment  # Cognitive impairment Exam is notable for disorganization, and he does have cognitive deficits as characterized by [place]. IADL level is unclear without collateral. He is advised to come for follow up with the staff.   # Bipolar disorder by history in remission Exam is notable for tangential thought process, while he is calm and denies significant mood symptoms since the last appointment.  Although collateral information is very limited, the staff denies any behavioral issues or change after tapering down lithium.  Will continue lithium to target bipolar disorder.  Will continue quetiapine for bipolar disorder.  Will continue  Paxil for depression.  Noted that most recent admission is in 2016; it is difficult to discern if the patient was actually experiencing hypomanic episode, although bipolar diagnosis was documented in the chart (he had insomnia, crying spells, helplessness, loss of appetite and lost 30 lbs in three months). Will continue to monitor.   # hypothyroidism The staff reports that he did check with his provider regarding his thyroid.   Plan I have reviewed and updated plans as below 1. Continue Quetiapine 50 mg in the morning, 400 mg at night 2. Continue lithium 300 mg at night  3. Continue Paroxetine 20 mg daily 4. Return to clinic in three months for 30 mins Reviewed labs.   The patient demonstrates the following risk factors for suicide: Chronic risk factors for suicide include:psychiatric disorder ofbipolar disorder. Acute risk factorsfor suicide include: unemployment and social withdrawal/isolation. Protective factorsfor this patient include: positive social support and hope for the future. Considering these factors, the overall suicide risk at this point appears to below. Patientisappropriate for outpatient follow up.  The duration of this appointment visit was 25 minutes of face-to-face time with the patient.  Greater than 50% of this time was spent in counseling, explanation of  diagnosis, planning of further management, and coordination of care.  [name], MD 04/15/2018, 2:41 PM

## 2018-04-15 ENCOUNTER — Ambulatory Visit (INDEPENDENT_AMBULATORY_CARE_PROVIDER_SITE_OTHER): Payer: Medicare Other | Admitting: Psychiatry

## 2018-04-15 ENCOUNTER — Ambulatory Visit (HOSPITAL_COMMUNITY): Payer: Medicare Other | Admitting: Psychiatry

## 2018-04-15 ENCOUNTER — Encounter (HOSPITAL_COMMUNITY): Payer: Self-pay | Admitting: Psychiatry

## 2018-04-15 VITALS — BP 118/68 | HR 93 | Ht 67.0 in | Wt 175.0 lb

## 2018-04-15 DIAGNOSIS — F0630 Mood disorder due to known physiological condition, unspecified: Secondary | ICD-10-CM | POA: Diagnosis not present

## 2018-04-15 DIAGNOSIS — R4189 Other symptoms and signs involving cognitive functions and awareness: Secondary | ICD-10-CM

## 2018-04-15 MED ORDER — QUETIAPINE FUMARATE 50 MG PO TABS: 50.0000 mg | ORAL_TABLET | Freq: Every day | ORAL | 0 refills | Status: DC

## 2018-04-15 MED ORDER — LITHIUM CARBONATE 300 MG PO CAPS: 300.0000 mg | ORAL_CAPSULE | Freq: Every day | ORAL | 0 refills | Status: DC

## 2018-04-15 MED ORDER — QUETIAPINE FUMARATE 400 MG PO TABS: 400.0000 mg | ORAL_TABLET | Freq: Every day | ORAL | 0 refills | Status: DC

## 2018-04-15 MED ORDER — PAROXETINE HCL 20 MG PO TABS: 20.0000 mg | ORAL_TABLET | Freq: Every day | ORAL | 0 refills | Status: DC

## 2018-04-15 NOTE — Patient Instructions (Signed)
1. Continue Quetiapine 50 mg in the morning, 400 mg at night 2. Decrease lithium 300 mg at night  3. Continue Paroxetine 20 mg daily 4. Return to clinic in three months for 15 mins

## 2018-04-24 DIAGNOSIS — F319 Bipolar disorder, unspecified: Secondary | ICD-10-CM | POA: Diagnosis not present

## 2018-04-24 DIAGNOSIS — E038 Other specified hypothyroidism: Secondary | ICD-10-CM | POA: Diagnosis not present

## 2018-04-28 ENCOUNTER — Other Ambulatory Visit: Payer: Self-pay

## 2018-04-28 ENCOUNTER — Ambulatory Visit (INDEPENDENT_AMBULATORY_CARE_PROVIDER_SITE_OTHER): Payer: Medicare Other | Admitting: Nurse Practitioner

## 2018-04-28 ENCOUNTER — Encounter: Payer: Self-pay | Admitting: Nurse Practitioner

## 2018-04-28 DIAGNOSIS — Z1211 Encounter for screening for malignant neoplasm of colon: Secondary | ICD-10-CM

## 2018-04-28 DIAGNOSIS — Z0000 Encounter for general adult medical examination without abnormal findings: Secondary | ICD-10-CM | POA: Insufficient documentation

## 2018-04-28 DIAGNOSIS — Z01818 Encounter for other preprocedural examination: Secondary | ICD-10-CM

## 2018-04-28 MED ORDER — PEG 3350-KCL-NA BICARB-NACL 420 G PO SOLR: 4000.0000 mL | ORAL | 0 refills | Status: DC

## 2018-04-28 NOTE — Patient Instructions (Signed)
Your health issues we discussed today were:   Need for colonoscopy: 1. We will schedule your colonoscopy for you 2. Further recommendations will be made after your colonoscopy  Overall I recommend:  1. Return for follow-up based on recommendations made after your colonoscopy 2. Call us if you have any questions or concerns.    Because of recent events of COVID-19 ("Coronavirus"), follow CDC recommendations:  Wash your hand frequently Avoid touching your face Stay away from people who are sick If you have symptoms such as fever, cough, shortness of breath then call your healthcare provider for further guidance If you are sick, STAY AT HOME unless otherwise directed by your healthcare provider.    At [facility] we value your feedback. You may receive a survey about your visit today. Please share your experience as we strive to create trusting relationships with our patients to provide genuine, compassionate, quality care.  We appreciate your understanding and patience as we review any laboratory studies, imaging, and other diagnostic tests that are ordered as we care for you. Our office policy is 5 business days for review of these results, and any emergent or urgent results are addressed in a timely manner for your best interest. If you do not hear from our office in 1 week, please contact us.   We also encourage the use of MyChart, which contains your medical information for your review as well. If you are not enrolled in this feature, an access code is on this after visit summary for your convenience. Thank you for allowing [place] to be involved in your care.  It was great to see you today!  I hope you have a great day!!

## 2018-04-28 NOTE — Progress Notes (Signed)
CC'D TO PCP °

## 2018-04-28 NOTE — Assessment & Plan Note (Signed)
The patient has never had a colonoscopy before and is age 68.  He is currently due.  He does have constipation historically but this is generally well controlled with senna.  No diagnostic rationale for colonoscopy.  We will proceed with screening colonoscopy at this time.  Follow-up based on post procedure recommendations.  Proceed with colonoscopy on propofol/MAC with Dr. Oneida Alar in the near future. The risks, benefits, and alternatives have been discussed in detail with the patient. They state understanding and desire to proceed.   Bipolar disorder.  He is currently on Marinol, Seroquel.  No other anticoagulants, anxiolytics, chronic pain medications, or antidepressants.  We will plan for the procedure on propofol/MAC to promote adequate sedation.

## 2018-04-28 NOTE — Progress Notes (Signed)
Primary Care Physician:  Rosita Fire, MD Primary Gastroenterologist:  Dr. Oneida Alar  Chief Complaint  Patient presents with  . Consult    TCS. Never had done prior  . Constipation    HPI:   Ricky Ward is a 68 y.o. male who presents on referral from primary care for colonoscopy.  Nurse/phone triage was deferred to office visit due to medications likely necessitating augmented sedation.  No history of colonoscopy in our system.  Today he states he's doing ok overall. He has never had a colonoscopy before. States he does have constipation and on senna, which helps his constipation. This is effective for him and no complaints about this. Denies abdominal pain, N/V, fever, chills, hemtochezia, melena. egden  Past Medical History:  Diagnosis Date  . Bipolar 1 disorder ([place])   . Constipation   . Hypercholesterolemia   . Thyroid disease     Past Surgical History:  Procedure Laterality Date  . FOOT SURGERY  08/23/2003  . WRIST FRACTURE SURGERY Left     Current Outpatient Medications  Medication Sig Dispense Refill  . acetaminophen (TYLENOL) 650 MG CR tablet Take 650 mg by mouth every 8 (eight) hours as needed for pain.    Marland Kitchen atorvastatin (LIPITOR) 20 MG tablet Take 20 mg by mouth daily.    . Calcipotriene-Betameth Diprop (ENSTILAR) 0.005-0.064 % FOAM Apply 1 application topically daily as needed (for psoriazis).    . clotrimazole-betamethasone (LOTRISONE) cream Apply 1 application topically 2 (two) times daily as needed (rash).    Marland Kitchen dronabinol (MARINOL) 2.5 MG capsule Take 2.5 mg by mouth 2 (two) times daily before a meal.    . ketoconazole (NIZORAL) 2 % cream Apply 1 application topically 2 (two) times daily.    Marland Kitchen ketoconazole (NIZORAL) 2 % shampoo Apply 1 application topically daily.    Marland Kitchen LEVOTHYROXINE SODIUM PO Take 100 mcg by mouth daily.     Derrill Memo ON 04/29/2018] lithium carbonate 300 MG capsule Take 1 capsule (300 mg total) by mouth at bedtime. 90 capsule 0  .  magnesium oxide (MAG-OX) 400 MG tablet Take 400 mg by mouth 2 (two) times daily.    Derrill Memo ON 04/29/2018] PARoxetine (PAXIL) 20 MG tablet Take 1 tablet (20 mg total) by mouth daily. 90 tablet 0  . QUEtiapine (SEROQUEL) 400 MG tablet Take 1 tablet (400 mg total) by mouth at bedtime. 90 tablet 0  . [START ON 04/29/2018] QUEtiapine (SEROQUEL) 50 MG tablet Take 1 tablet (50 mg total) by mouth at bedtime. 90 tablet 0  . sennosides-docusate sodium (SENOKOT-S) 8.6-50 MG tablet Take 1 tablet by mouth daily as needed for constipation.     No current facility-administered medications for this visit.     Allergies as of 04/28/2018 - Review Complete 04/28/2018  Allergen Reaction Noted  . Penicillins Swelling 08/27/2011    Family History  Problem Relation Age of Onset  . Alcohol abuse Brother   . Colon cancer Neg Hx     Social History   Socioeconomic History  . Marital status: Divorced    Spouse name: Not on file  . Number of children: Not on file  . Years of education: Not on file  . Highest education level: Not on file  Occupational History  . Not on file  Social Needs  . Financial resource strain: Not on file  . Food insecurity:    Worry: Not on file    Inability: Not on file  . Transportation needs:  Medical: Not on file    Non-medical: Not on file  Tobacco Use  . Smoking status: Former Smoker    Types: Cigarettes  . Smokeless tobacco: Never Used  Substance and Sexual Activity  . Alcohol use: No  . Drug use: No  . Sexual activity: Not on file  Lifestyle  . Physical activity:    Days per week: Not on file    Minutes per session: Not on file  . Stress: Not on file  Relationships  . Social connections:    Talks on phone: Not on file    Gets together: Not on file    Attends religious service: Not on file    Active member of club or organization: Not on file    Attends meetings of clubs or organizations: Not on file    Relationship status: Not on file  . Intimate  partner violence:    Fear of current or ex partner: Not on file    Emotionally abused: Not on file    Physically abused: Not on file    Forced sexual activity: Not on file  Other Topics Concern  . Not on file  Social History Narrative  . Not on file    Review of Systems: Complete ROS negative except as per HPI.    Physical Exam: BP 99/60   Pulse 92   Temp (!) 97 F (36.1 C) (Oral)   Ht 5\' 7"  (1.702 m)   Wt 175 lb 9.6 oz (79.7 kg)   BMI 27.50 kg/m  General:   Alert and oriented. Pleasant and cooperative. Well-nourished and well-developed.  Head:  Normocephalic and atraumatic. Eyes:  Without icterus, sclera clear and conjunctiva pink.  Ears:  Normal auditory acuity. Cardiovascular:  S1, S2 present without murmurs appreciated. Extremities without clubbing or edema. Respiratory:  Clear to auscultation bilaterally. No wheezes, rales, or rhonchi. No distress.  Gastrointestinal:  +BS, soft, non-tender and non-distended. No HSM noted. No guarding or rebound. No masses appreciated.  Rectal:  Deferred  Musculoskalatal:  Symmetrical without gross deformities. Neurologic:  Alert and oriented x4;  grossly normal neurologically. Psych:  Alert and cooperative. Normal mood and affect. Heme/Lymph/Immune: No excessive bruising noted.    04/28/2018 9:49 AM   Disclaimer: This note was dictated with voice recognition software. Similar sounding words can inadvertently be transcribed and may not be corrected upon review.

## 2018-06-16 ENCOUNTER — Telehealth: Payer: Self-pay

## 2018-06-16 NOTE — Telephone Encounter (Signed)
Called [facility] and spoke to [place], [place] w/Propofol w/RMR that was for 06/23/18 rescheduled to 09/11/18 at 11:15am d/t COVID-19 restrictions. Endo scheduler informed. Pre-op appt rescheduled to 09/05/18 at 12:45pm. Appt letter and new procedure instructions faxed to facility (fax# [phone]).

## 2018-06-17 ENCOUNTER — Encounter (HOSPITAL_COMMUNITY): Admission: RE | Admit: 2018-06-17 | Payer: Medicare Other | Source: Ambulatory Visit

## 2018-06-17 ENCOUNTER — Inpatient Hospital Stay (HOSPITAL_COMMUNITY): Admission: RE | Admit: 2018-06-17 | Payer: Self-pay | Source: Ambulatory Visit

## 2018-06-26 DIAGNOSIS — B351 Tinea unguium: Secondary | ICD-10-CM | POA: Diagnosis not present

## 2018-06-26 DIAGNOSIS — M79674 Pain in right toe(s): Secondary | ICD-10-CM | POA: Diagnosis not present

## 2018-06-26 DIAGNOSIS — M79675 Pain in left toe(s): Secondary | ICD-10-CM | POA: Diagnosis not present

## 2018-07-09 NOTE — Progress Notes (Signed)
Virtual Visit via Telephone Note  I connected with Ricky Ward on 07/17/18 at  2:00 PM EDT by telephone and verified that I am speaking with the correct person using two identifiers.   I discussed the limitations, risks, security and privacy concerns of performing an evaluation and management service by telephone and the availability of in person appointments. I also discussed with the patient that there may be a patient responsible charge related to this service. The patient expressed understanding and agreed to proceed.       I discussed the assessment and treatment plan with the patient. The patient was provided an opportunity to ask questions and all were answered. The patient agreed with the plan and demonstrated an understanding of the instructions.   The patient was advised to call back or seek an in-person evaluation if the symptoms worsen or if the condition fails to improve as anticipated.  I provided 15 minutes of non-face-to-face time during this encounter.   Ricky Clay, MD   [facility] MD/PA/NP OP Progress Note  07/17/2018 2:24 PM NICLAS Ward  MRN:  [id number]  Chief Complaint:  Chief Complaint    Follow-up; Memory Loss     HPI:  Ms. is a follow-up visit for cognitive disorder and bipolar disorder.  He states that he has been doing well.  He occasionally participates in the activity at the facility.  He talks with his sister almost every week.  She is doing very well.  He denies any concern at the facility. Upon discussing the lab test, he asks if we can postpone the test due to pandemic issues. He denies insomnia. He has good energy.  Good appetite.  He has fair concentration.  He denies SI.  He denies decreased need for sleep or euphoria.  He denies any hallucinations.  He denies paranoia.   The staff at [facility] presents to the interview.  He has been doing very well. No behavioral or safety concern. He takes medication regularly.     Visit Diagnosis:     ICD-10-CM   1. Bipolar disorder in full remission, most recent episode unspecified type ([place]) F31.70 Lithium level    Basic Metabolic Panel (BMET)    TSH    Past Psychiatric History: Please see initial evaluation for full details. I have reviewed the history. No updates at this time.     Past Medical History:  Past Medical History:  Diagnosis Date  . Bipolar 1 disorder ([place])   . Constipation   . Hypercholesterolemia   . Thyroid disease     Past Surgical History:  Procedure Laterality Date  . FOOT SURGERY  08/23/2003  . WRIST FRACTURE SURGERY Left     Family Psychiatric History: Please see initial evaluation for full details. I have reviewed the history. No updates at this time.     Family History:  Family History  Problem Relation Age of Onset  . Alcohol abuse Brother   . Colon cancer Neg Hx     Social History:  Social History   Socioeconomic History  . Marital status: Divorced    Spouse name: Not on file  . Number of children: Not on file  . Years of education: Not on file  . Highest education level: Not on file  Occupational History  . Not on file  Social Needs  . Financial resource strain: Not on file  . Food insecurity:    Worry: Not on file    Inability: Not on file  . Transportation  needs:    Medical: Not on file    Non-medical: Not on file  Tobacco Use  . Smoking status: Former Smoker    Types: Cigarettes  . Smokeless tobacco: Never Used  Substance and Sexual Activity  . Alcohol use: No  . Drug use: No  . Sexual activity: Not on file  Lifestyle  . Physical activity:    Days per week: Not on file    Minutes per session: Not on file  . Stress: Not on file  Relationships  . Social connections:    Talks on phone: Not on file    Gets together: Not on file    Attends religious service: Not on file    Active member of club or organization: Not on file    Attends meetings of clubs or organizations: Not on file    Relationship status: Not on file   Other Topics Concern  . Not on file  Social History Narrative  . Not on file    Allergies:  Allergies  Allergen Reactions  . Penicillins Swelling    Has patient had a PCN reaction causing immediate rash, facial/tongue/throat swelling, SOB or lightheadedness with hypotension: Unknown Has patient had a PCN reaction causing severe rash involving mucus membranes or skin necrosis: Unknown Has patient had a PCN reaction that required hospitalization: Unknown Has patient had a PCN reaction occurring within the last 10 years: No If all of the above answers are "NO", then may proceed with Cephalosporin use.     Metabolic Disorder Labs: No results found for: HGBA1C, MPG No results found for: PROLACTIN No results found for: CHOL, TRIG, HDL, CHOLHDL, VLDL, LDLCALC Lab Results  Component Value Date   TSH 0.18 (L) 01/13/2018   TSH 0.09 (L) 10/09/2017    Therapeutic Level Labs: Lab Results  Component Value Date   LITHIUM 1.0 01/13/2018   LITHIUM 0.9 10/09/2017   No results found for: VALPROATE No components found for:  CBMZ  Current Medications: Current Outpatient Medications  Medication Sig Dispense Refill  . acetaminophen (TYLENOL) 650 MG CR tablet Take 650 mg by mouth every 8 (eight) hours as needed for pain.    Marland Kitchen atorvastatin (LIPITOR) 20 MG tablet Take 20 mg by mouth daily.    . Calcipotriene-Betameth Diprop (ENSTILAR) 0.005-0.064 % FOAM Apply 1 application topically daily as needed (for psoriazis).    . clotrimazole-betamethasone (LOTRISONE) cream Apply 1 application topically 2 (two) times daily as needed (rash).    Marland Kitchen dronabinol (MARINOL) 2.5 MG capsule Take 2.5 mg by mouth 2 (two) times daily before a meal.    . guaiFENesin (ROBITUSSIN) 100 MG/5ML liquid Take 200 mg by mouth every 6 (six) hours as needed for cough.    . hydrocortisone 2.5 % cream Apply 1 application topically daily.    Marland Kitchen ketoconazole (NIZORAL) 2 % cream Apply 1 application topically 2 (two) times daily.     Marland Kitchen ketoconazole (NIZORAL) 2 % shampoo Apply 1 application topically daily.    Marland Kitchen levothyroxine (SYNTHROID) 88 MCG tablet Take 88 mcg by mouth daily before breakfast.     . lithium carbonate 300 MG capsule Take 1 capsule (300 mg total) by mouth at bedtime. 90 capsule 0  . magnesium oxide (MAG-OX) 400 MG tablet Take 400 mg by mouth 2 (two) times daily.    Marland Kitchen PARoxetine (PAXIL) 20 MG tablet Take 1 tablet (20 mg total) by mouth daily. 90 tablet 0  . QUEtiapine (SEROQUEL) 400 MG tablet Take 1 tablet (400 mg total)  by mouth at bedtime. 90 tablet 0  . QUEtiapine (SEROQUEL) 50 MG tablet Take 1 tablet (50 mg total) by mouth at bedtime. 90 tablet 0  . sennosides-docusate sodium (SENOKOT-S) 8.6-50 MG tablet Take 1 tablet by mouth daily as needed for constipation.     No current facility-administered medications for this visit.      Musculoskeletal: Strength & Muscle Tone: N/A Gait & Station: N/A Patient leans: N/A  Psychiatric Specialty Exam: Review of Systems  Psychiatric/Behavioral: Positive for memory loss. Negative for depression, hallucinations, substance abuse and suicidal ideas. The patient is not nervous/anxious and does not have insomnia.   All other systems reviewed and are negative.   There were no vitals taken for this visit.There is no height or weight on file to calculate BMI.  General Appearance: NA  Eye Contact:  NA  Speech:  Clear and Coherent  Volume:  Normal  Mood:  "good  Affect:  NA  Thought Process:  Coherent  Orientation:  Full (Time, Place, and Person)  Thought Content: Logical   Suicidal Thoughts:  No  Homicidal Thoughts:  No  Memory:  Immediate;   Good  Judgement:  Fair  Insight:  Shallow  Psychomotor Activity:  Normal  Concentration:  Concentration: Good and Attention Span: Good  Recall:  Good  Fund of Knowledge: Good  Language: Good  Akathisia:  No  Handed:  Right  AIMS (if indicated): not done  Assets:  Communication Skills Desire for Improvement   ADL's:  Intact  Cognition: WNL  Sleep:  Good   Screenings:   Assessment and Plan:  Ricky Ward is a 68 y.o. year old male with a history of cognitive disorder, bipolar disorder by history,, alcohol use disorder in sustained remission, hypothyroidism  , who presents for follow up appointment for Bipolar disorder in full remission, most recent episode unspecified type ([place]) - Plan: Lithium level, Basic Metabolic Panel (BMET), TSH  # Cognitive impairment He demonstrates linear thought process on today's evaluation.  There has been no significant behavioral concern from the staff.  He does have cognitive deficits as characterized by [place].  Will continue to evaluate as needed.   # Bipolar disorder by history in remission He demonstrates linear thought process on today's exam (he was tangential on previous visit).  Will continue lithium to target bipolar disorder.  Will obtain labs to rule out any side effect.  Will continue quetiapine for bipolar disorder.  We will continue Paxil for depression.  Noted that most recent admission is in 2016; it is difficult to discern if the patient was actually experiencing hypomanic episode, although bipolardiagnosis was documented in the chart (he had insomnia, crying spells, helplessness, loss of appetite and lost 30 lbs in three months).  We will continue to monitor.   # hypothyroidism (followed by PCP) The last lab check was reportedly in December. Will obtain TSH.   Plan I have reviewed and updated plans as below 1. Continue Quetiapine 50 mg in the morning, 400 mg at night 2. Continuelithium 300 mg at night 3. Continue Paroxetine 20 mg daily 4. Next appointment: 8/27 at 1:20 for 20 mins, phone Obtain labs: (order sent to [facility]). He prefers to postpone test; he is advised to get it within three months.   [facility] Fax: [phone]   The patient demonstrates the following risk factors for suicide: Chronic risk factors for suicide  include:psychiatric disorder ofbipolar disorder. Acute risk factorsfor suicide include: unemployment and social withdrawal/isolation. Protective factorsfor this patient include: positive  social support and hope for the future. Considering these factors, the overall suicide risk at this point appears to below. Patientisappropriate for outpatient follow up.  Ricky Clay, MD 07/17/2018, 2:24 PM

## 2018-07-17 ENCOUNTER — Encounter (HOSPITAL_COMMUNITY): Payer: Self-pay | Admitting: Psychiatry

## 2018-07-17 ENCOUNTER — Ambulatory Visit (INDEPENDENT_AMBULATORY_CARE_PROVIDER_SITE_OTHER): Payer: Medicare Other | Admitting: Psychiatry

## 2018-07-17 ENCOUNTER — Other Ambulatory Visit: Payer: Self-pay

## 2018-07-17 DIAGNOSIS — R4189 Other symptoms and signs involving cognitive functions and awareness: Secondary | ICD-10-CM

## 2018-07-17 DIAGNOSIS — F3170 Bipolar disorder, currently in remission, most recent episode unspecified: Secondary | ICD-10-CM

## 2018-07-17 MED ORDER — PAROXETINE HCL 20 MG PO TABS: 20.0000 mg | ORAL_TABLET | Freq: Every day | ORAL | 0 refills | Status: DC

## 2018-07-17 MED ORDER — LITHIUM CARBONATE 300 MG PO CAPS: 300.0000 mg | ORAL_CAPSULE | Freq: Every day | ORAL | 0 refills | Status: DC

## 2018-07-17 MED ORDER — QUETIAPINE FUMARATE 50 MG PO TABS: 50.0000 mg | ORAL_TABLET | Freq: Every day | ORAL | 0 refills | Status: DC

## 2018-07-17 MED ORDER — QUETIAPINE FUMARATE 400 MG PO TABS: 400.0000 mg | ORAL_TABLET | Freq: Every day | ORAL | 0 refills | Status: DC

## 2018-07-17 NOTE — Patient Instructions (Addendum)
1. Continue Quetiapine 50 mg in the morning, 400 mg at night 2. Continuelithium 300 mg at night 3. Continue Paroxetine 20 mg daily 4. Next appointment: 8/27 at 1:20 5. Obtain blood test at [facility]. (Order was sent electronically. If any issues to get blood test, please contact our clinic)

## 2018-07-22 DIAGNOSIS — E038 Other specified hypothyroidism: Secondary | ICD-10-CM | POA: Diagnosis not present

## 2018-07-22 DIAGNOSIS — F3170 Bipolar disorder, currently in remission, most recent episode unspecified: Secondary | ICD-10-CM | POA: Diagnosis not present

## 2018-08-28 DIAGNOSIS — M79674 Pain in right toe(s): Secondary | ICD-10-CM | POA: Diagnosis not present

## 2018-08-28 DIAGNOSIS — B351 Tinea unguium: Secondary | ICD-10-CM | POA: Diagnosis not present

## 2018-08-28 DIAGNOSIS — M79675 Pain in left toe(s): Secondary | ICD-10-CM | POA: Diagnosis not present

## 2018-08-29 DIAGNOSIS — H25813 Combined forms of age-related cataract, bilateral: Secondary | ICD-10-CM | POA: Diagnosis not present

## 2018-08-29 DIAGNOSIS — H53021 Refractive amblyopia, right eye: Secondary | ICD-10-CM | POA: Diagnosis not present

## 2018-08-29 DIAGNOSIS — H50111 Monocular exotropia, right eye: Secondary | ICD-10-CM | POA: Diagnosis not present

## 2018-09-05 ENCOUNTER — Encounter (HOSPITAL_COMMUNITY): Admission: RE | Admit: 2018-09-05 | Payer: Medicare Other | Source: Ambulatory Visit

## 2018-09-10 ENCOUNTER — Telehealth: Payer: Self-pay | Admitting: *Deleted

## 2018-09-10 NOTE — Telephone Encounter (Signed)
Ricky Ward in endo states they spoke with patient on Friday and the facility. He is wanting to cancel procedure for now d/t COVID-19. He wants to hold off on having procedure done. Called facility and confirmed patient did not want to r/s at this time. FYI

## 2018-09-10 NOTE — Telephone Encounter (Signed)
Noted  

## 2018-09-11 ENCOUNTER — Ambulatory Visit (HOSPITAL_COMMUNITY): Admission: RE | Admit: 2018-09-11 | Payer: Medicare Other | Source: Home / Self Care | Admitting: Internal Medicine

## 2018-09-11 ENCOUNTER — Other Ambulatory Visit (HOSPITAL_COMMUNITY): Payer: Medicare Other

## 2018-09-11 ENCOUNTER — Encounter (HOSPITAL_COMMUNITY): Admission: RE | Payer: Self-pay | Source: Home / Self Care

## 2018-09-11 SURGERY — COLONOSCOPY WITH PROPOFOL
Anesthesia: Monitor Anesthesia Care

## 2018-09-23 DIAGNOSIS — F319 Bipolar disorder, unspecified: Secondary | ICD-10-CM | POA: Diagnosis not present

## 2018-09-23 DIAGNOSIS — E785 Hyperlipidemia, unspecified: Secondary | ICD-10-CM | POA: Diagnosis not present

## 2018-09-23 DIAGNOSIS — E039 Hypothyroidism, unspecified: Secondary | ICD-10-CM | POA: Diagnosis not present

## 2018-10-06 NOTE — Progress Notes (Signed)
Virtual Visit via Telephone Note  I connected with Ricky Ward on 10/09/18 at  1:20 PM EDT by telephone and verified that I am speaking with the correct person using two identifiers.   I discussed the limitations, risks, security and privacy concerns of performing an evaluation and management service by telephone and the availability of in person appointments. I also discussed with the patient that there may be a patient responsible charge related to this service. The patient expressed understanding and agreed to proceed.     I discussed the assessment and treatment plan with the patient. The patient was provided an opportunity to ask questions and all were answered. The patient agreed with the plan and demonstrated an understanding of the instructions.   The patient was advised to call back or seek an in-person evaluation if the symptoms worsen or if the condition fails to improve as anticipated.  I provided 15 minutes of non-face-to-face time during this encounter.   [name], MD    [facility] MD/PA/NP OP Progress Note  10/09/2018 1:42 PM Ricky Ward  MRN:  [id number]  Chief Complaint:  Chief Complaint    Depression; Follow-up; Other     HPI:  This is a follow-up appointment for bipolar disorder and cognitive impairment.  He states that he has been doing well, stating that he "cannot complain."  Although he does not do so much during the day, he enjoys watching TV and listening to music.  He talks with the sister on the phone at times.  He denies insomnia.  He denies feeling depressed or anxious.  He has fair motivation.  He has good appetite.  He denies SI.  He denies decreased need for sleep or euphoria.  He denies hallucinations or paranoia.   The staff at [facility] present to the interview.  There has been no change since the last visit. No safety concern or behavioral concern. He takes medication regularly. The staff agreed to bring him to obtain blood test.    Visit  Diagnosis:    ICD-10-CM   1. Bipolar disorder in full remission, most recent episode unspecified type ([place])  F31.70   2. Cognitive impairment  R41.89     Past Psychiatric History: Please see initial evaluation for full details. I have reviewed the history. No updates at this time.     Past Medical History:  Past Medical History:  Diagnosis Date  . Bipolar 1 disorder ([place])   . Constipation   . Hypercholesterolemia   . Thyroid disease     Past Surgical History:  Procedure Laterality Date  . FOOT SURGERY  08/23/2003  . WRIST FRACTURE SURGERY Left     Family Psychiatric History: Please see initial evaluation for full details. I have reviewed the history. No updates at this time.     Family History:  Family History  Problem Relation Age of Onset  . Alcohol abuse Brother   . Colon cancer Neg Hx     Social History:  Social History   Socioeconomic History  . Marital status: Divorced    Spouse name: Not on file  . Number of children: Not on file  . Years of education: Not on file  . Highest education level: Not on file  Occupational History  . Not on file  Social Needs  . Financial resource strain: Not on file  . Food insecurity    Worry: Not on file    Inability: Not on file  . Transportation needs  Medical: Not on file    Non-medical: Not on file  Tobacco Use  . Smoking status: Former Smoker    Types: Cigarettes  . Smokeless tobacco: Never Used  Substance and Sexual Activity  . Alcohol use: No  . Drug use: No  . Sexual activity: Not on file  Lifestyle  . Physical activity    Days per week: Not on file    Minutes per session: Not on file  . Stress: Not on file  Relationships  . Social Herbalist on phone: Not on file    Gets together: Not on file    Attends religious service: Not on file    Active member of club or organization: Not on file    Attends meetings of clubs or organizations: Not on file    Relationship status: Not on file   Other Topics Concern  . Not on file  Social History Narrative  . Not on file    Allergies:  Allergies  Allergen Reactions  . Penicillins Swelling    Has patient had a PCN reaction causing immediate rash, facial/tongue/throat swelling, SOB or lightheadedness with hypotension: Unknown Has patient had a PCN reaction causing severe rash involving mucus membranes or skin necrosis: Unknown Has patient had a PCN reaction that required hospitalization: Unknown Has patient had a PCN reaction occurring within the last 10 years: No If all of the above answers are "NO", then may proceed with Cephalosporin use.     Metabolic Disorder Labs: No results found for: HGBA1C, MPG No results found for: PROLACTIN No results found for: CHOL, TRIG, HDL, CHOLHDL, VLDL, LDLCALC Lab Results  Component Value Date   TSH 0.18 (L) 01/13/2018   TSH 0.09 (L) 10/09/2017    Therapeutic Level Labs: Lab Results  Component Value Date   LITHIUM 1.0 01/13/2018   LITHIUM 0.9 10/09/2017   No results found for: VALPROATE No components found for:  CBMZ  Current Medications: Current Outpatient Medications  Medication Sig Dispense Refill  . acetaminophen (TYLENOL) 650 MG CR tablet Take 650 mg by mouth every 8 (eight) hours as needed for pain.    Marland Kitchen atorvastatin (LIPITOR) 20 MG tablet Take 20 mg by mouth daily.    . Calcipotriene-Betameth Diprop (ENSTILAR) 0.005-0.064 % FOAM Apply 1 application topically daily as needed (for psoriazis).    . clotrimazole-betamethasone (LOTRISONE) cream Apply 1 application topically 2 (two) times daily as needed (rash).    Marland Kitchen dronabinol (MARINOL) 2.5 MG capsule Take 2.5 mg by mouth 2 (two) times daily before a meal.    . guaiFENesin (ROBITUSSIN) 100 MG/5ML liquid Take 200 mg by mouth every 6 (six) hours as needed for cough.    . hydrocortisone 2.5 % cream Apply 1 application topically daily.    Marland Kitchen ketoconazole (NIZORAL) 2 % cream Apply 1 application topically 2 (two) times daily.     Marland Kitchen ketoconazole (NIZORAL) 2 % shampoo Apply 1 application topically daily.    Marland Kitchen levothyroxine (SYNTHROID) 88 MCG tablet Take 88 mcg by mouth daily before breakfast.     . [START ON 10/15/2018] lithium carbonate 300 MG capsule Take 1 capsule (300 mg total) by mouth at bedtime. 90 capsule 0  . magnesium oxide (MAG-OX) 400 MG tablet Take 400 mg by mouth 2 (two) times daily.    Derrill Memo ON 10/15/2018] PARoxetine (PAXIL) 20 MG tablet Take 1 tablet (20 mg total) by mouth daily. 90 tablet 0  . [START ON 10/15/2018] QUEtiapine (SEROQUEL) 400 MG tablet Take  1 tablet (400 mg total) by mouth at bedtime. 90 tablet 0  . [START ON 10/15/2018] QUEtiapine (SEROQUEL) 50 MG tablet Take 1 tablet (50 mg total) by mouth at bedtime. 90 tablet 0  . sennosides-docusate sodium (SENOKOT-S) 8.6-50 MG tablet Take 1 tablet by mouth daily as needed for constipation.     No current facility-administered medications for this visit.      Musculoskeletal: Strength & Muscle Tone: N/A Gait & Station: N/A Patient leans: N/A  Psychiatric Specialty Exam: ROS  There were no vitals taken for this visit.There is no height or weight on file to calculate BMI.  General Appearance: NA  Eye Contact:  NA  Speech:  Clear and Coherent  Volume:  Normal  Mood:  "good  Affect:  NA  Thought Process:  Coherent  Orientation:  Full (Time, Place, and Person)  Thought Content: Logical   Suicidal Thoughts:  No  Homicidal Thoughts:  No  Memory:  Immediate;   Good  Judgement:  Good  Insight:  Present  Psychomotor Activity:  Normal  Concentration:  Concentration: Good and Attention Span: Good  Recall:  Good  Fund of Knowledge: Good  Language: Good  Akathisia:  No  Handed:  Right  AIMS (if indicated): not done  Assets:  Communication Skills Desire for Improvement  ADL's:  Intact  Cognition: WNL  Sleep:  Good   Screenings:   Assessment and Plan:  Ricky Ward is a 68 y.o. 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  There has been no significant behavioral concerns from the staff.  He does have cognitive deficits as characterized by [place].  We will continue to evaluate as needed.   #  bipolar disorder by history in remission He denies any significant mood symptoms since her last visit.  Will continue quetiapine for bipolar disorder.  We will continue lithium for bipolar disorder.  Will obtain labs to rule out any possible side effects.  Will continue Paxil for depression. Noted that most recent admission is in 2016;it is difficult to discern if the patient was actually experiencing hypomanic episode, although bipolardiagnosis was documented in the chart (he had insomnia, crying spells, helplessness, loss of appetite and lost 30 lbs in three months).  Will continue to monitor.   # hypothyroidism (followed by PCP) He has not done blood test since last year. Will recheck TSH.   Plan I have reviewed and updated plans as below 1. Continue Quetiapine 50 mg in the morning, 400 mg at night 2.Continuelithium 300 mg at night 3. Continue Paroxetine 20 mg daily 4. Next appointment: 11/4 at 11:20 for 20 mins, phone  5. Please obtain blood test at [facility] (TSH, BMP, lithium)  [facility] Fax: [phone]   The patient demonstrates the following risk factors for suicide: Chronic risk factors for suicide include:psychiatric disorder ofbipolar disorder. Acute risk factorsfor suicide include: unemployment and social withdrawal/isolation. Protective factorsfor this patient include: positive social support and hope for the future. Considering these factors, the overall suicide risk at this point appears  to below. Patientisappropriate for outpatient follow up.  [name], MD 10/09/2018, 1:42 PM

## 2018-10-09 ENCOUNTER — Ambulatory Visit (INDEPENDENT_AMBULATORY_CARE_PROVIDER_SITE_OTHER): Payer: Medicare Other | Admitting: Psychiatry

## 2018-10-09 ENCOUNTER — Encounter (HOSPITAL_COMMUNITY): Payer: Self-pay | Admitting: Psychiatry

## 2018-10-09 ENCOUNTER — Other Ambulatory Visit: Payer: Self-pay

## 2018-10-09 DIAGNOSIS — R4189 Other symptoms and signs involving cognitive functions and awareness: Secondary | ICD-10-CM

## 2018-10-09 DIAGNOSIS — F3170 Bipolar disorder, currently in remission, most recent episode unspecified: Secondary | ICD-10-CM

## 2018-10-09 MED ORDER — QUETIAPINE FUMARATE 50 MG PO TABS: 50.0000 mg | ORAL_TABLET | Freq: Every day | ORAL | 0 refills | Status: DC

## 2018-10-09 MED ORDER — QUETIAPINE FUMARATE 400 MG PO TABS: 400.0000 mg | ORAL_TABLET | Freq: Every day | ORAL | 0 refills | Status: DC

## 2018-10-09 MED ORDER — PAROXETINE HCL 20 MG PO TABS: 20.0000 mg | ORAL_TABLET | Freq: Every day | ORAL | 0 refills | Status: DC

## 2018-10-09 MED ORDER — LITHIUM CARBONATE 300 MG PO CAPS: 300.0000 mg | ORAL_CAPSULE | Freq: Every day | ORAL | 0 refills | Status: DC

## 2018-10-09 NOTE — Patient Instructions (Signed)
1. Continue Quetiapine 50 mg in the morning, 400 mg at night 2.Continuelithium 300 mg at night 3. Continue Paroxetine 20 mg daily 4. Next appointment: 11/4 at 11:20 for 20 mins, phone  5. Please obtain blood test at [facility]

## 2018-10-10 ENCOUNTER — Other Ambulatory Visit (HOSPITAL_COMMUNITY): Payer: Self-pay | Admitting: Psychiatry

## 2018-10-10 DIAGNOSIS — F3170 Bipolar disorder, currently in remission, most recent episode unspecified: Secondary | ICD-10-CM | POA: Diagnosis not present

## 2018-10-11 LAB — BASIC METABOLIC PANEL WITH GFR
BUN/Creatinine Ratio: 10 (calc) (ref 6–22)
BUN: 15 mg/dL (ref 7–25)
CO2: 30 mmol/L (ref 20–32)
Calcium: 8.8 mg/dL (ref 8.6–10.3)
Chloride: 106 mmol/L (ref 98–110)
Creat: 1.57 mg/dL — ABNORMAL HIGH (ref 0.70–1.25)
GFR, Est African American: 51 mL/min/{1.73_m2} — ABNORMAL LOW (ref >=60)
GFR, Est Non African American: 44 mL/min/{1.73_m2} — ABNORMAL LOW (ref >=60)
Glucose, Bld: 111 mg/dL — ABNORMAL HIGH (ref 65–99)
Potassium: 4.4 mmol/L (ref 3.5–5.3)
Sodium: 141 mmol/L (ref 135–146)

## 2018-10-11 LAB — LITHIUM LEVEL: Lithium Lvl: 0.5 mmol/L — ABNORMAL LOW (ref 0.6–1.2)

## 2018-10-11 LAB — TSH: TSH: 0.63 mIU/L (ref 0.40–4.50)

## 2018-10-21 ENCOUNTER — Encounter (HOSPITAL_COMMUNITY): Payer: Self-pay | Admitting: Psychiatry

## 2018-10-21 DIAGNOSIS — F3170 Bipolar disorder, currently in remission, most recent episode unspecified: Secondary | ICD-10-CM

## 2018-10-21 NOTE — Telephone Encounter (Signed)
This encounter was created in error - please disregard.

## 2018-10-24 DIAGNOSIS — E038 Other specified hypothyroidism: Secondary | ICD-10-CM | POA: Diagnosis not present

## 2018-10-24 DIAGNOSIS — E785 Hyperlipidemia, unspecified: Secondary | ICD-10-CM | POA: Diagnosis not present

## 2018-11-11 DIAGNOSIS — M79675 Pain in left toe(s): Secondary | ICD-10-CM | POA: Diagnosis not present

## 2018-11-11 DIAGNOSIS — B351 Tinea unguium: Secondary | ICD-10-CM | POA: Diagnosis not present

## 2018-11-11 DIAGNOSIS — M79674 Pain in right toe(s): Secondary | ICD-10-CM | POA: Diagnosis not present

## 2018-11-17 DIAGNOSIS — Z23 Encounter for immunization: Secondary | ICD-10-CM | POA: Diagnosis not present

## 2018-11-17 DIAGNOSIS — F319 Bipolar disorder, unspecified: Secondary | ICD-10-CM | POA: Diagnosis not present

## 2018-11-17 DIAGNOSIS — E038 Other specified hypothyroidism: Secondary | ICD-10-CM | POA: Diagnosis not present

## 2018-11-25 ENCOUNTER — Other Ambulatory Visit (HOSPITAL_COMMUNITY): Payer: Self-pay | Admitting: *Deleted

## 2018-11-25 DIAGNOSIS — F3170 Bipolar disorder, currently in remission, most recent episode unspecified: Secondary | ICD-10-CM | POA: Diagnosis not present

## 2018-11-26 ENCOUNTER — Encounter (HOSPITAL_COMMUNITY): Payer: Self-pay | Admitting: Psychiatry

## 2018-11-26 ENCOUNTER — Telehealth (HOSPITAL_COMMUNITY): Payer: Self-pay | Admitting: Psychiatry

## 2018-11-26 LAB — BASIC METABOLIC PANEL
BUN/Creatinine Ratio: 9 (calc) (ref 6–22)
BUN: 13 mg/dL (ref 7–25)
CO2: 29 mmol/L (ref 20–32)
Calcium: 9.1 mg/dL (ref 8.6–10.3)
Chloride: 105 mmol/L (ref 98–110)
Creat: 1.52 mg/dL — ABNORMAL HIGH (ref 0.70–1.18)
Glucose, Bld: 123 mg/dL — ABNORMAL HIGH (ref 65–99)
Potassium: 4.4 mmol/L (ref 3.5–5.3)
Sodium: 141 mmol/L (ref 135–146)

## 2018-11-26 LAB — LITHIUM LEVEL: Lithium Lvl: 0.5 mmol/L — ABNORMAL LOW (ref 0.6–1.2)

## 2018-11-26 LAB — TSH: TSH: 0.4 mIU/L (ref 0.40–4.50)

## 2018-11-26 MED ORDER — LITHIUM CARBONATE 150 MG PO CAPS: 150.0000 mg | ORAL_CAPSULE | Freq: Every day | ORAL | 0 refills | Status: DC

## 2018-11-26 NOTE — Telephone Encounter (Signed)
Could you contact the patient/facility: his creatine level (to measure kidney function) is higher than expected. Please advise to lower the dose of lithium to 150 mg at night (order sent to pharmacy). Please also advise him to have follow up with his primary care for further evaluation. Will plan to send a letter with lab result as well.

## 2018-11-26 NOTE — Progress Notes (Deleted)
Could you contact the patient/facility: his creatine levels is higher than expected. Please advise to lower the dose of lithium to 150 mg at night (order sent to pharmacy). Please also advise him to have follow up with his primary care for further evaluation. Will plan to send a letter with lab result as well.

## 2018-11-26 NOTE — Telephone Encounter (Signed)
SPOKE WITH GAIL @ [facility] WILL BE COMING & PER PROVIDER: Could you contact the patient/facility: his creatine level (to measure kidney function) is higher than expected. Please advise to lower the dose of lithium to 150 mg at night (order sent to pharmacy). Please also advise him to have follow up with his primary care for further evaluation. Will plan to send a letter with lab result as well.

## 2018-12-10 NOTE — Progress Notes (Signed)
Virtual Visit via Telephone Note  I connected with Ricky Ward on 12/17/18 at 11:20 AM EST by telephone and verified that I am speaking with the correct person using two identifiers.   I discussed the limitations, risks, security and privacy concerns of performing an evaluation and management service by telephone and the availability of in person appointments. I also discussed with the patient that there may be a patient responsible charge related to this service. The patient expressed understanding and agreed to proceed.     I discussed the assessment and treatment plan with the patient. The patient was provided an opportunity to ask questions and all were answered. The patient agreed with the plan and demonstrated an understanding of the instructions.   The patient was advised to call back or seek an in-person evaluation if the symptoms worsen or if the condition fails to improve as anticipated.  I provided 15 minutes of non-face-to-face time during this encounter.   Ricky Clay, MD    [facility] MD/PA/NP OP Progress Note  12/17/2018 11:43 AM Ricky Ward  MRN:  [id number]  Chief Complaint:  Chief Complaint    Follow-up; Other     HPI:  This is a follow-up appointment for cognitive disorder and bipolar disorder.  He states that he has been doing well. He calls his children and his sister when he gets a chance. He enjoys helping staff at group home when he can. He denies any concern.  He denies feeling depressed or anxiety.  He denies insomnia.  He has good appetite.  He denies SI, HI, AH, VH.  He denies decreased need for sleep or euphoria.   The staff at [facility] presents to the interview He has been doing well. There has been no significant change since tapering down lithium. He occasionally goes outside while he stays in the room most of the time. No known behavioral issues or safety concern.    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 Bipolar disorder in full remission, most recent episode unspecified type ([place])  [id number] Basic Metabolic Panel (BMET)    Lithium level    Past Psychiatric History: Please see initial evaluation for full details. I have reviewed the history. No updates at this time.     Past Medical History:  Past Medical History:  Diagnosis Date  . Bipolar 1 disorder ([place])   . Constipation   . Hypercholesterolemia   . Thyroid disease     Past Surgical History:  Procedure Laterality Date  . FOOT SURGERY  08/23/2003  . WRIST FRACTURE SURGERY Left     Family Psychiatric History: Please see initial evaluation for full details. I have reviewed the history. No updates at this time.     Family History:  Family History  Problem Relation Age of Onset  . Alcohol abuse Brother   . Colon cancer Neg Hx     Social History:  Social History   Socioeconomic History  . Marital status: Divorced    Spouse name: Not on file  . Number of children: Not on file  . Years of education: Not on file  . Highest education level: Not on file  Occupational History  . Not on file  Social Needs  . Financial resource strain: Not on file  . Food insecurity    Worry: Not on file  Inability: Not on file  . Transportation needs    Medical: Not on file    Non-medical: Not on file  Tobacco Use  . Smoking status: Former Smoker    Types: Cigarettes  . Smokeless tobacco: Never Used  Substance and Sexual Activity  . Alcohol use: No  . Drug use: No  . Sexual activity: Not on file  Lifestyle  . Physical activity    Days per week: Not on file    Minutes per session: Not on file  . Stress: Not on file  Relationships  . Social Herbalist on phone: Not on file    Gets together: Not on file    Attends religious service: Not on file    Active member of club or organization:  Not on file    Attends meetings of clubs or organizations: Not on file    Relationship status: Not on file  Other Topics Concern  . Not on file  Social History Narrative  . Not on file    Allergies:  Allergies  Allergen Reactions  . Penicillins Swelling    Has patient had a PCN reaction causing immediate rash, facial/tongue/throat swelling, SOB or lightheadedness with hypotension: Unknown Has patient had a PCN reaction causing severe rash involving mucus membranes or skin necrosis: Unknown Has patient had a PCN reaction that required hospitalization: Unknown Has patient had a PCN reaction occurring within the last 10 years: No If all of the above answers are "NO", then may proceed with Cephalosporin use.     Metabolic Disorder Labs: No results found for: HGBA1C, MPG No results found for: PROLACTIN No results found for: CHOL, TRIG, HDL, CHOLHDL, VLDL, LDLCALC Lab Results  Component Value Date   TSH 0.40 11/25/2018   TSH 0.63 10/10/2018    Therapeutic Level Labs: Lab Results  Component Value Date   LITHIUM 0.5 (L) 11/25/2018   LITHIUM 0.5 (L) 10/10/2018   No results found for: VALPROATE No components found for:  CBMZ  Current Medications: Current Outpatient Medications  Medication Sig Dispense Refill  . acetaminophen (TYLENOL) 650 MG CR tablet Take 650 mg by mouth every 8 (eight) hours as needed for pain.    Marland Kitchen atorvastatin (LIPITOR) 20 MG tablet Take 20 mg by mouth daily.    . Calcipotriene-Betameth Diprop (ENSTILAR) 0.005-0.064 % FOAM Apply 1 application topically daily as needed (for psoriazis).    . clotrimazole-betamethasone (LOTRISONE) cream Apply 1 application topically 2 (two) times daily as needed (rash).    Marland Kitchen dronabinol (MARINOL) 2.5 MG capsule Take 2.5 mg by mouth 2 (two) times daily before a meal.    . guaiFENesin (ROBITUSSIN) 100 MG/5ML liquid Take 200 mg by mouth every 6 (six) hours as needed for cough.    . hydrocortisone 2.5 % cream Apply 1 application  topically daily.    Marland Kitchen ketoconazole (NIZORAL) 2 % cream Apply 1 application topically 2 (two) times daily.    Marland Kitchen ketoconazole (NIZORAL) 2 % shampoo Apply 1 application topically daily.    Marland Kitchen levothyroxine (SYNTHROID) 88 MCG tablet Take 88 mcg by mouth daily before breakfast.     . [START ON 02/23/2019] lithium carbonate 150 MG capsule Take 1 capsule (150 mg total) by mouth at bedtime. 30 capsule 0  . magnesium oxide (MAG-OX) 400 MG tablet Take 400 mg by mouth 2 (two) times daily.    Derrill Memo ON 01/13/2019] PARoxetine (PAXIL) 20 MG tablet Take 1 tablet (20 mg total) by mouth daily. 90 tablet 1  . [  START ON 01/14/2019] QUEtiapine (SEROQUEL) 400 MG tablet Take 1 tablet (400 mg total) by mouth at bedtime. 90 tablet 1  . [START ON 01/14/2019] QUEtiapine (SEROQUEL) 50 MG tablet Take 1 tablet (50 mg total) by mouth at bedtime. 90 tablet 1  . sennosides-docusate sodium (SENOKOT-S) 8.6-50 MG tablet Take 1 tablet by mouth daily as needed for constipation.     No current facility-administered medications for this visit.      Musculoskeletal: Strength & Muscle Tone: N/A Gait & Station: N/A Patient leans: N/A  Psychiatric Specialty Exam: Review of Systems  Psychiatric/Behavioral: Positive for memory loss. Negative for depression, hallucinations, substance abuse and suicidal ideas. The patient is not nervous/anxious and does not have insomnia.   All other systems reviewed and are negative.   There were no vitals taken for this visit.There is no height or weight on file to calculate BMI.  General Appearance: NA  Eye Contact:  NA  Speech:  Clear and Coherent  Volume:  Normal  Mood:  "fine"  Affect:  NA  Thought Process:  Coherent  Orientation:  Full (Time, Place, and Person)  Thought Content: Logical   Suicidal Thoughts:  No  Homicidal Thoughts:  No  Memory:  Immediate;   Fair  Judgement:  Good  Insight:  Present  Psychomotor Activity:  Normal  Concentration:  Concentration: Fair and Attention  Span: Fair  Recall:  [place] of Knowledge: Good  Language: Good  Akathisia:  No  Handed:  Right  AIMS (if indicated): not done  Assets:  Social Support  ADL's:  Intact  Cognition: Impaired,  Mild  Sleep:  Good   Screenings:   Assessment and Plan:  KNOXX BAETZ is a 68 y.o. 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  There has been no significant behavior concerns according to the collateral. He does have cognitive deficits as characterized by [place]. Will continue to evaluate.   # Bipolar disorder by history in remission There has been no significant mood symptoms since the last visit.  Lithium was tapered down given his elevated creatinine on recent blood test.  He has an upcoming PCP visit in December; will get another blood test to avoid lithium toxicity.  We will continue quetiapine for mood dysregulation.  Discussed potential metabolic side effect.  We will continue Paxil for depression.  Noted that most recent admission is in 2016;it is difficult to discern if the patient was actually experiencing hypomanic episode, although bipolardiagnosis was documented in the chart (he had insomnia, crying spells, helplessness, loss of appetite and lost 30 lbs in three months). Will continue to monitor.   Plan I have reviewed and updated plans as below 1. Continue Quetiapine 50 mg in the morning, 400 mg at night 2.Continuelithium 150 mg at night (reduced from 300 mg due to increase in creatinine)  3. Continue Paroxetine 20 mg daily 4.Next appointment: in January 5. Please obtain blood test at [facility] (BMP, lithium) [facility]  Fax: [phone]   The patient demonstrates the following risk factors for suicide: Chronic risk factors for suicide include:psychiatric disorder ofbipolar disorder. Acute risk factorsfor suicide include: unemployment and social withdrawal/isolation. Protective factorsfor this patient include: positive social support  and hope for the future. Considering these factors, the overall suicide risk at this point appears to below. Patientisappropriate for outpatient follow up.  Ricky Clay, MD 12/17/2018, 11:43 AM

## 2018-12-17 ENCOUNTER — Other Ambulatory Visit: Payer: Self-pay

## 2018-12-17 ENCOUNTER — Ambulatory Visit (INDEPENDENT_AMBULATORY_CARE_PROVIDER_SITE_OTHER): Payer: Medicare Other | Admitting: Psychiatry

## 2018-12-17 ENCOUNTER — Encounter (HOSPITAL_COMMUNITY): Payer: Self-pay | Admitting: Psychiatry

## 2018-12-17 DIAGNOSIS — F3170 Bipolar disorder, currently in remission, most recent episode unspecified: Secondary | ICD-10-CM

## 2018-12-17 MED ORDER — PAROXETINE HCL 20 MG PO TABS: 20.0000 mg | ORAL_TABLET | Freq: Every day | ORAL | 1 refills | Status: DC

## 2018-12-17 MED ORDER — QUETIAPINE FUMARATE 400 MG PO TABS: 400.0000 mg | ORAL_TABLET | Freq: Every day | ORAL | 1 refills | Status: DC

## 2018-12-17 MED ORDER — LITHIUM CARBONATE 150 MG PO CAPS: 150.0000 mg | ORAL_CAPSULE | Freq: Every day | ORAL | 0 refills | Status: DC

## 2018-12-17 MED ORDER — QUETIAPINE FUMARATE 50 MG PO TABS: 50.0000 mg | ORAL_TABLET | Freq: Every day | ORAL | 1 refills | Status: DC

## 2018-12-17 NOTE — Addendum Note (Signed)
Addended by: [name] on: 12/17/2018 01:26 PM   Modules accepted: Orders

## 2018-12-17 NOTE — Patient Instructions (Signed)
1. Continue Quetiapine 50 mg in the morning, 400 mg at night 2.Continuelithium 150 mg at night  3. Continue Paroxetine 20 mg daily 4.Next appointment: in January 5. Please obtain blood test at [facility] (BMP, lithium)

## 2019-01-06 DIAGNOSIS — F3170 Bipolar disorder, currently in remission, most recent episode unspecified: Secondary | ICD-10-CM | POA: Diagnosis not present

## 2019-01-07 LAB — BASIC METABOLIC PANEL
BUN/Creatinine Ratio: 11 (calc) (ref 6–22)
BUN: 18 mg/dL (ref 7–25)
CO2: 29 mmol/L (ref 20–32)
Calcium: 9.1 mg/dL (ref 8.6–10.3)
Chloride: 102 mmol/L (ref 98–110)
Creat: 1.68 mg/dL — ABNORMAL HIGH (ref 0.70–1.18)
Glucose, Bld: 114 mg/dL — ABNORMAL HIGH (ref 65–99)
Potassium: 4.4 mmol/L (ref 3.5–5.3)
Sodium: 139 mmol/L (ref 135–146)

## 2019-01-07 LAB — LITHIUM LEVEL: Lithium Lvl: 0.3 mmol/L — ABNORMAL LOW (ref 0.6–1.2)

## 2019-01-13 ENCOUNTER — Telehealth: Payer: Self-pay | Admitting: Psychiatry

## 2019-01-13 ENCOUNTER — Encounter: Payer: Self-pay | Admitting: Psychiatry

## 2019-01-13 DIAGNOSIS — B351 Tinea unguium: Secondary | ICD-10-CM | POA: Diagnosis not present

## 2019-01-13 DIAGNOSIS — M79675 Pain in left toe(s): Secondary | ICD-10-CM | POA: Diagnosis not present

## 2019-01-13 DIAGNOSIS — M79674 Pain in right toe(s): Secondary | ICD-10-CM | POA: Diagnosis not present

## 2019-01-13 MED ORDER — DIVALPROEX SODIUM 250 MG PO DR TAB: 250.0000 mg | DELAYED_RELEASE_TABLET | Freq: Every day | ORAL | 0 refills | Status: DC

## 2019-01-13 NOTE — Telephone Encounter (Signed)
I received a fax from psychiatrist Dr. Harrington Challenger stating that she was notified that pt's l;atest lab work revealed elevated creatinine level of 1.68. His previous creatinine level in October 2020 was 1.5.  His medication list was reviewed by me. Noted that he is on a very low dose of Lithium 150 mg HS. Latest Lithium level is 0.3.  I called and spoke with the director Ms. Tammy at his residential facility. I told her my recommendation of discontinuing Lithium due to elevated creatinine level. She requested that the pt is placed on a different mood stabilizer if Lithium is being discontinued. The plan of starting him on low dose Depakote 250 mg HS was discussed. Prescription was sent to his pharmacy.  The director was recommended to ensure that pt is drinking adequate amount of water and liquids. She was recommended to discuss his lab values with pt's PCP.

## 2019-01-15 DIAGNOSIS — Z20828 Contact with and (suspected) exposure to other viral communicable diseases: Secondary | ICD-10-CM | POA: Diagnosis not present

## 2019-01-15 DIAGNOSIS — U071 COVID-19: Secondary | ICD-10-CM | POA: Diagnosis not present

## 2019-01-19 DIAGNOSIS — Z20828 Contact with and (suspected) exposure to other viral communicable diseases: Secondary | ICD-10-CM | POA: Diagnosis not present

## 2019-01-19 DIAGNOSIS — U071 COVID-19: Secondary | ICD-10-CM | POA: Diagnosis not present

## 2019-02-06 ENCOUNTER — Emergency Department (HOSPITAL_COMMUNITY)
Admission: EM | Admit: 2019-02-06 | Discharge: 2019-02-06 | Disposition: A | Discharge status: Home / Self Care | Payer: Medicare Other | Attending: Emergency Medicine | Admitting: Emergency Medicine

## 2019-02-06 ENCOUNTER — Other Ambulatory Visit: Payer: Self-pay

## 2019-02-06 ENCOUNTER — Emergency Department (HOSPITAL_COMMUNITY): Payer: Medicare Other

## 2019-02-06 ENCOUNTER — Encounter (HOSPITAL_COMMUNITY): Payer: Self-pay

## 2019-02-06 DIAGNOSIS — E079 Disorder of thyroid, unspecified: Secondary | ICD-10-CM | POA: Insufficient documentation

## 2019-02-06 DIAGNOSIS — R059 Cough, unspecified: Secondary | ICD-10-CM

## 2019-02-06 DIAGNOSIS — R05 Cough: Secondary | ICD-10-CM | POA: Diagnosis not present

## 2019-02-06 DIAGNOSIS — R0602 Shortness of breath: Secondary | ICD-10-CM | POA: Diagnosis not present

## 2019-02-06 DIAGNOSIS — Z79899 Other long term (current) drug therapy: Secondary | ICD-10-CM | POA: Diagnosis not present

## 2019-02-06 LAB — CBC WITH DIFFERENTIAL/PLATELET
Abs Immature Granulocytes: 0.02 10*3/uL (ref 0.00–0.07)
Basophils Absolute: 0 10*3/uL (ref 0.0–0.1)
Basophils Relative: 0 %
Eosinophils Absolute: 0 10*3/uL (ref 0.0–0.5)
Eosinophils Relative: 0 %
HCT: 43.9 % (ref 39.0–52.0)
Hemoglobin: 14.3 g/dL (ref 13.0–17.0)
Immature Granulocytes: 0 %
Lymphocytes Relative: 23 %
Lymphs Abs: 1.1 10*3/uL (ref 0.7–4.0)
MCH: 31.7 pg (ref 26.0–34.0)
MCHC: 32.6 g/dL (ref 30.0–36.0)
MCV: 97.3 fL (ref 80.0–100.0)
Monocytes Absolute: 0.5 10*3/uL (ref 0.1–1.0)
Monocytes Relative: 11 %
Neutro Abs: 3.2 10*3/uL (ref 1.7–7.7)
Neutrophils Relative %: 66 %
Platelets: 197 10*3/uL (ref 150–400)
RBC: 4.51 MIL/uL (ref 4.22–5.81)
RDW: 13.2 % (ref 11.5–15.5)
WBC: 4.9 10*3/uL (ref 4.0–10.5)
nRBC: 0 % (ref 0.0–0.2)

## 2019-02-06 LAB — COMPREHENSIVE METABOLIC PANEL
ALT: 27 U/L (ref 0–44)
AST: 59 U/L — ABNORMAL HIGH (ref 15–41)
Albumin: 3.3 g/dL — ABNORMAL LOW (ref 3.5–5.0)
Alkaline Phosphatase: 61 U/L (ref 38–126)
Anion gap: 12 (ref 5–15)
BUN: 18 mg/dL (ref 8–23)
CO2: 26 mmol/L (ref 22–32)
Calcium: 8 mg/dL — ABNORMAL LOW (ref 8.9–10.3)
Chloride: 98 mmol/L (ref 98–111)
Creatinine, Ser: 1.38 mg/dL — ABNORMAL HIGH (ref 0.61–1.24)
GFR calc Af Amer: 60 mL/min — ABNORMAL LOW (ref >60)
GFR calc non Af Amer: 51 mL/min — ABNORMAL LOW (ref >60)
Glucose, Bld: 122 mg/dL — ABNORMAL HIGH (ref 70–99)
Potassium: 3.8 mmol/L (ref 3.5–5.1)
Sodium: 136 mmol/L (ref 135–145)
Total Bilirubin: 0.8 mg/dL (ref 0.3–1.2)
Total Protein: 7.5 g/dL (ref 6.5–8.1)

## 2019-02-06 MED ORDER — ACETAMINOPHEN 325 MG PO TABS
650.0000 mg | ORAL_TABLET | Freq: Once | ORAL | Status: AC
  Administered 2019-02-06: 18:00:00 650 mg via ORAL
  Filled 2019-02-06: qty 2

## 2019-02-06 NOTE — ED Notes (Signed)
[facility] states they will transport pt back to facility.

## 2019-02-06 NOTE — ED Triage Notes (Signed)
Pt arrived via EMS from [facility]. EMS report staff told them pt has been more confused today, and has covid symtpoms, (cough, fever, runny nose, and body aches). Pt says he tested positive for Covid about 2 weeks ago, pt says he does not know why he is here, says "they woke him up and sent him over here". PT denies pain or feeling any worse. Pt says, he "just has a cold". Pt is alert self, place, and time. Pt can state that it is christmas and able to state the name of current president.

## 2019-02-06 NOTE — ED Provider Notes (Signed)
[facility] EMERGENCY DEPARTMENT Provider Note   CSN: [id number] Arrival date & time: 02/06/19  1549     History Chief Complaint  Patient presents with  . Cough    Ricky Ward is a 68 y.o. male.  Patient's days at a nursing home and he is Covid positive.  He was sent over here for fever and cough  The history is provided by the patient. No language interpreter was used.  Cough Cough characteristics:  Dry Sputum characteristics:  Nondescript Severity:  Mild Onset quality:  Sudden Timing:  Constant Progression:  Waxing and waning Chronicity:  New Associated symptoms: no chest pain, no eye discharge, no headaches and no rash        Past Medical History:  Diagnosis Date  . Bipolar 1 disorder ([place])   . Constipation   . Hypercholesterolemia   . Thyroid disease     Patient Active Problem List   Diagnosis Date Noted  . Preventative health care 04/28/2018  . Bipolar disorder in full remission ([place]) 10/09/2017  . Alcohol use disorder, mild, in sustained remission 10/09/2017    Past Surgical History:  Procedure Laterality Date  . FOOT SURGERY  08/23/2003  . WRIST FRACTURE SURGERY Left        Family History  Problem Relation Age of Onset  . Alcohol abuse Brother   . Colon cancer Neg Hx     Social History   Tobacco Use  . Smoking status: Former Smoker    Types: Cigarettes  . Smokeless tobacco: Never Used  Substance Use Topics  . Alcohol use: No  . Drug use: No    Home Medications Prior to Admission medications   Medication Sig Start Date End Date Taking? Authorizing Provider  acetaminophen (TYLENOL) 650 MG CR tablet Take 650 mg by mouth every 8 (eight) hours as needed for pain.   Yes [provider]  atorvastatin (LIPITOR) 20 MG tablet Take 20 mg by mouth daily.   Yes [provider]  clotrimazole-betamethasone (LOTRISONE) cream Apply 1 application topically 2 (two) times daily as needed (rash).   Yes [provider]    divalproex (DEPAKOTE) 250 MG DR tablet Take 1 tablet (250 mg total) by mouth at bedtime. 01/13/19  Yes Nevada Crane, MD  dronabinol (MARINOL) 2.5 MG capsule Take 2.5 mg by mouth 2 (two) times daily before a meal.   Yes [provider]  guaiFENesin (ROBITUSSIN) 100 MG/5ML liquid Take 200 mg by mouth every 6 (six) hours as needed for cough.   Yes [provider]  hydrocortisone 2.5 % cream Apply 1 application topically daily. *Apply to face, scalp, neck, and behind the ears   Yes [provider]  ketoconazole (NIZORAL) 2 % cream Apply 1 application topically daily. Applied to the face, scalp, neck, and behind the ears   Yes [provider]  ketoconazole (NIZORAL) 2 % shampoo Apply 1 application topically daily.   Yes [provider]  levothyroxine (SYNTHROID) 88 MCG tablet Take 88 mcg by mouth daily before breakfast.    Yes [provider]  magnesium oxide (MAG-OX) 400 MG tablet Take 400 mg by mouth 2 (two) times daily.   Yes [provider]  Magnesium Oxide 400 (240 Mg) MG TABS Take 1 tablet by mouth 2 (two) times daily. 01/23/19  Yes [provider]  PARoxetine (PAXIL) 20 MG tablet Take 1 tablet (20 mg total) by mouth daily. 01/13/19  Yes Hisada, Elie Goody, MD  QUEtiapine (SEROQUEL) 400 MG tablet Take  1 tablet (400 mg total) by mouth at bedtime. 01/14/19  Yes [name], MD  QUEtiapine (SEROQUEL) 50 MG tablet Take 1 tablet (50 mg total) by mouth at bedtime. Patient taking differently: Take 50 mg by mouth daily.  01/14/19  Yes [name], MD  sennosides-docusate sodium (SENOKOT-S) 8.6-50 MG tablet Take 1 tablet by mouth daily as needed for constipation.   Yes [provider]    Allergies    Penicillins  Review of Systems   Review of Systems  Constitutional: Negative for appetite change and fatigue.  HENT: Negative for congestion, ear discharge and sinus pressure.   Eyes: Negative for discharge.  Respiratory:  Positive for cough.   Cardiovascular: Negative for chest pain.  Gastrointestinal: Negative for abdominal pain and diarrhea.  Genitourinary: Negative for frequency and hematuria.  Musculoskeletal: Negative for back pain.  Skin: Negative for rash.  Neurological: Negative for seizures and headaches.  Psychiatric/Behavioral: Negative for hallucinations.    Physical Exam Updated Vital Signs BP 126/75   Pulse 87   Temp 99.2 F (37.3 C) (Oral)   Resp 16   Ht 5\' 7"  (1.702 m)   Wt 79.7 kg   SpO2 95%   BMI 27.52 kg/m   Physical Exam Vitals and nursing note reviewed.  Constitutional:      Appearance: He is well-developed.  HENT:     Head: Normocephalic.     Nose: Nose normal.  Eyes:     General: No scleral icterus.    Conjunctiva/sclera: Conjunctivae normal.  Neck:     Thyroid: No thyromegaly.  Cardiovascular:     Rate and Rhythm: Normal rate and regular rhythm.     Heart sounds: No murmur. No friction rub. No gallop.   Pulmonary:     Breath sounds: No stridor. No wheezing or rales.  Chest:     Chest wall: No tenderness.  Abdominal:     General: There is no distension.     Tenderness: There is no abdominal tenderness. There is no rebound.  Musculoskeletal:        General: Normal range of motion.     Cervical back: Neck supple.  Lymphadenopathy:     Cervical: No cervical adenopathy.  Skin:    Findings: No erythema or rash.  Neurological:     Mental Status: He is alert and oriented to person, place, and time.     Motor: No abnormal muscle tone.     Coordination: Coordination normal.  Psychiatric:        Behavior: Behavior normal.     ED Results / Procedures / Treatments   Labs (all labs ordered are listed, but only abnormal results are displayed) Labs Reviewed  COMPREHENSIVE METABOLIC PANEL - Abnormal; Notable for the following components:      Result Value   Glucose, Bld 122 (*)    Creatinine, Ser 1.38 (*)    Calcium 8.0 (*)    Albumin 3.3 (*)    AST 59 (*)     GFR calc non Af Amer 51 (*)    GFR calc Af Amer 60 (*)    All other components within normal limits  CBC WITH DIFFERENTIAL/PLATELET    EKG None  Radiology DG Chest Portable 1 View  Result Date: 02/06/2019 CLINICAL DATA:  Shortness of breath. EXAM: PORTABLE CHEST 1 VIEW COMPARISON:  Radiographs 02/11/2017 and 05/15/2014. FINDINGS: 1648 hours. There is a moderate size hiatal hernia which has slightly enlarged. The heart size and mediastinal contours are otherwise stable. There is  chronic central airway thickening with possible superimposed ill-defined ground-glass opacities in the right lung. There is no confluent airspace opacity, pleural effusion or pneumothorax. The bones appear unchanged. IMPRESSION: 1. Possible ill-defined ground-glass opacities in the right lung. Consider atypical infection. No consolidation or edema. 2. Enlarging hiatal hernia. Electronically Signed   By: Richardean Sale M.D.   On: 02/06/2019 17:02    Procedures Procedures (including critical care time)  Medications Ordered in ED Medications  acetaminophen (TYLENOL) tablet 650 mg (650 mg Oral Given 02/06/19 1751)    ED Course  I have reviewed the triage vital signs and the nursing notes.  Pertinent labs & imaging results that were available during my care of the patient were reviewed by me and considered in my medical decision making (see chart for details).    MDM Rules/Calculators/A&P                     Patient with COVID-19.  He is nontoxic and not hypoxic.  Labs are unremarkable chest x-ray shows infection consistent with COVID-19.  He will be discharged home Final Clinical Impression(s) / ED Diagnoses Final diagnoses:  Cough    Rx / DC Orders ED Discharge Orders    None       Milton Ferguson, MD 02/06/19 2106

## 2019-02-06 NOTE — Discharge Instructions (Addendum)
Use Tylenol for fever drink plenty of fluids and follow-up if not improving

## 2019-02-06 NOTE — ED Triage Notes (Signed)
Patient from [facility] facility. Presents with cough and fever. Pt reported to be COVID positive per EMS.

## 2019-02-06 NOTE — ED Notes (Signed)
Attempted to get urine from pt. PT missed urinal and urinated into the sheets on the floor.

## 2019-02-11 ENCOUNTER — Encounter: Payer: Self-pay | Admitting: Gastroenterology

## 2019-02-19 DIAGNOSIS — R278 Other lack of coordination: Secondary | ICD-10-CM | POA: Diagnosis not present

## 2019-02-25 ENCOUNTER — Ambulatory Visit (HOSPITAL_COMMUNITY): Payer: Medicare Other | Admitting: Psychiatry

## 2019-02-26 DIAGNOSIS — R278 Other lack of coordination: Secondary | ICD-10-CM | POA: Diagnosis not present

## 2019-02-26 DIAGNOSIS — E038 Other specified hypothyroidism: Secondary | ICD-10-CM | POA: Diagnosis not present

## 2019-02-26 DIAGNOSIS — E785 Hyperlipidemia, unspecified: Secondary | ICD-10-CM | POA: Diagnosis not present

## 2019-02-26 DIAGNOSIS — Z0001 Encounter for general adult medical examination with abnormal findings: Secondary | ICD-10-CM | POA: Diagnosis not present

## 2019-02-27 ENCOUNTER — Ambulatory Visit: Payer: Self-pay | Admitting: Psychiatry

## 2019-02-27 ENCOUNTER — Encounter: Payer: Self-pay | Admitting: Child and Adolescent Psychiatry

## 2019-02-27 ENCOUNTER — Other Ambulatory Visit: Payer: Self-pay

## 2019-02-27 ENCOUNTER — Ambulatory Visit (INDEPENDENT_AMBULATORY_CARE_PROVIDER_SITE_OTHER): Payer: Medicare Other | Admitting: Child and Adolescent Psychiatry

## 2019-02-27 DIAGNOSIS — F3170 Bipolar disorder, currently in remission, most recent episode unspecified: Secondary | ICD-10-CM

## 2019-02-27 MED ORDER — QUETIAPINE FUMARATE 50 MG PO TABS: 50.0000 mg | ORAL_TABLET | Freq: Every day | ORAL | 0 refills | Status: DC

## 2019-02-27 MED ORDER — PAROXETINE HCL 20 MG PO TABS: 20.0000 mg | ORAL_TABLET | Freq: Every day | ORAL | 1 refills | Status: DC

## 2019-02-27 MED ORDER — DIVALPROEX SODIUM 250 MG PO DR TAB: 250.0000 mg | DELAYED_RELEASE_TABLET | Freq: Every day | ORAL | 0 refills | Status: AC

## 2019-02-27 MED ORDER — QUETIAPINE FUMARATE 400 MG PO TABS: 400.0000 mg | ORAL_TABLET | Freq: Every day | ORAL | 1 refills | Status: DC

## 2019-02-27 NOTE — Patient Instructions (Signed)
-   Please take medications as prescribed.  - Please call the clinic for any questions/concerns - Please call the clinic to make follow up appointment with Dr. Modesta Messing if you don't hear back from clinic by early next week.

## 2019-02-27 NOTE — Progress Notes (Signed)
Virtual Visit via Telephone Note  I connected with Ricky Ward on 02/27/19 at  9:30 AM EST by telephone and verified that I am speaking with the correct person using two identifiers.  Location: Patient: home Provider: office   I discussed the limitations, risks, security and privacy concerns of performing an evaluation and management service by telephone and the availability of in person appointments. I also discussed with the patient that there may be a patient responsible charge related to this service. The patient expressed understanding and agreed to proceed.    I discussed the assessment and treatment plan with the patient. The patient was provided an opportunity to ask questions and all were answered. The patient agreed with the plan and demonstrated an understanding of the instructions.   The patient was advised to call back or seek an in-person evaluation if the symptoms worsen or if the condition fails to improve as anticipated.  I provided 15 minutes of non-face-to-face time during this encounter.   Orlene Erm, MD     [facility] MD/PA/NP OP Progress Note  02/27/2019 10:54 AM Ricky Ward  MRN:  [id number]  Chief Complaint: follow up appointment for bipolar disorder.   HPI: This is a 69 yo [place] M with hx of bipolar disorder, domiciled at [facility], pt of Dr. Modesta Messing. His group home called last week to schedule appointment due to worsening of his symptoms. He was therefore seen by this Probation officer since Dr. Modesta Messing is out of office. In the interim since the last visit with Dr. Modesta Messing he was in ER for cough due to COVID-19 on 12/25.   He was present with his group home case worked Ms. Sunday Spillers Little during the telephone encounter. He reports that he is doing "pretty good...", reports that his mood has been good, except brief episodes of sadness for about 5 times a week but denies any low lows or high highs. He reports that he is eating and sleeping well. He denies any  SI/HI/AVH and did not admit any delusions. He reports that he is able to take care of self. He was alert and oriented x 4, and was attentive.   His case worker at group home reported that pt was hallucinating and disoriented last week and they were not sure if it was because of the medication change(lithium to depakote) or because he was sick from [place] therefore they called to make an appointment. She reports that he was very sick from [place] but doing better now. She reports that this week he is doing better and returning to baseline and has not needed much assistance with ADLs. Staff denies any safety concerns or other concerns at this time. Discussed to continue with current meds and follow up with Dr. Modesta Messing on her return. They verbalized understanding.   Visit Diagnosis:    ICD-10-CM   1. Bipolar disorder in full remission, most recent episode unspecified type ([place])  F31.70     Past Psychiatric History: As mentioned in initial H&P, reviewed today, Lithium stopped due to increase Creat and started on Depakote 250 mg QHS.   Past Medical History:  Past Medical History:  Diagnosis Date  . Bipolar 1 disorder ([place])   . Constipation   . Hypercholesterolemia   . Thyroid disease     Past Surgical History:  Procedure Laterality Date  . FOOT SURGERY  08/23/2003  . WRIST FRACTURE SURGERY Left     Family Psychiatric History: no change.    Family  History:  Family History  Problem Relation Age of Onset  . Alcohol abuse Brother   . Colon cancer Neg Hx     Social History:  Social History   Socioeconomic History  . Marital status: Divorced    Spouse name: Not on file  . Number of children: Not on file  . Years of education: Not on file  . Highest education level: Not on file  Occupational History  . Not on file  Tobacco Use  . Smoking status: Former Smoker    Types: Cigarettes  . Smokeless tobacco: Never Used  Substance and Sexual Activity  . Alcohol use: No  . Drug use: No  .  Sexual activity: Not on file  Other Topics Concern  . Not on file  Social History Narrative  . Not on file   Social Determinants of Health   Financial Resource Strain:   . Difficulty of Paying Living Expenses: Not on file  Food Insecurity:   . Worried About Charity fundraiser in the Last Year: Not on file  . Ran Out of Food in the Last Year: Not on file  Transportation Needs:   . Lack of Transportation (Medical): Not on file  . Lack of Transportation (Non-Medical): Not on file  Physical Activity:   . Days of Exercise per Week: Not on file  . Minutes of Exercise per Session: Not on file  Stress:   . Feeling of Stress : Not on file  Social Connections:   . Frequency of Communication with Friends and Family: Not on file  . Frequency of Social Gatherings with Friends and Family: Not on file  . Attends Religious Services: Not on file  . Active Member of Clubs or Organizations: Not on file  . Attends Archivist Meetings: Not on file  . Marital Status: Not on file    Allergies:  Allergies  Allergen Reactions  . Penicillins Swelling    Has patient had a PCN reaction causing immediate rash, facial/tongue/throat swelling, SOB or lightheadedness with hypotension: Unknown Has patient had a PCN reaction causing severe rash involving mucus membranes or skin necrosis: Unknown Has patient had a PCN reaction that required hospitalization: Unknown Has patient had a PCN reaction occurring within the last 10 years: No If all of the above answers are "NO", then may proceed with Cephalosporin use.     Metabolic Disorder Labs: No results found for: HGBA1C, MPG No results found for: PROLACTIN No results found for: CHOL, TRIG, HDL, CHOLHDL, VLDL, LDLCALC Lab Results  Component Value Date   TSH 0.40 11/25/2018   TSH 0.63 10/10/2018    Therapeutic Level Labs: Lab Results  Component Value Date   LITHIUM 0.3 (L) 01/06/2019   LITHIUM 0.5 (L) 11/25/2018   No results found for:  VALPROATE No components found for:  CBMZ  Current Medications: Current Outpatient Medications  Medication Sig Dispense Refill  . acetaminophen (TYLENOL) 650 MG CR tablet Take 650 mg by mouth every 8 (eight) hours as needed for pain.    Marland Kitchen atorvastatin (LIPITOR) 20 MG tablet Take 20 mg by mouth daily.    . clotrimazole-betamethasone (LOTRISONE) cream Apply 1 application topically 2 (two) times daily as needed (rash).    . divalproex (DEPAKOTE) 250 MG DR tablet Take 1 tablet (250 mg total) by mouth at bedtime. 90 tablet 0  . dronabinol (MARINOL) 2.5 MG capsule Take 2.5 mg by mouth 2 (two) times daily before a meal.    . guaiFENesin (ROBITUSSIN) 100  MG/5ML liquid Take 200 mg by mouth every 6 (six) hours as needed for cough.    . hydrocortisone 2.5 % cream Apply 1 application topically daily. *Apply to face, scalp, neck, and behind the ears    . ketoconazole (NIZORAL) 2 % cream Apply 1 application topically daily. Applied to the face, scalp, neck, and behind the ears    . ketoconazole (NIZORAL) 2 % shampoo Apply 1 application topically daily.    Marland Kitchen levothyroxine (SYNTHROID) 88 MCG tablet Take 88 mcg by mouth daily before breakfast.     . magnesium oxide (MAG-OX) 400 MG tablet Take 400 mg by mouth 2 (two) times daily.    . Magnesium Oxide 400 (240 Mg) MG TABS Take 1 tablet by mouth 2 (two) times daily.    Marland Kitchen PARoxetine (PAXIL) 20 MG tablet Take 1 tablet (20 mg total) by mouth daily. 90 tablet 1  . QUEtiapine (SEROQUEL) 400 MG tablet Take 1 tablet (400 mg total) by mouth at bedtime. 90 tablet 1  . QUEtiapine (SEROQUEL) 50 MG tablet Take 1 tablet (50 mg total) by mouth at bedtime. 30 tablet 0  . sennosides-docusate sodium (SENOKOT-S) 8.6-50 MG tablet Take 1 tablet by mouth daily as needed for constipation.     No current facility-administered medications for this visit.     Musculoskeletal: Strength & Muscle Tone: N/A Gait & Station: N/A Patient leans: N/A  Psychiatric Specialty Exam: Review  of Systems  Psychiatric/Behavioral: Positive for memory loss. Negative for depression, hallucinations, substance abuse and suicidal ideas. The patient is not nervous/anxious and does not have insomnia.   All other systems reviewed and are negative.   There were no vitals taken for this visit.There is no height or weight on file to calculate BMI.  General Appearance: NA  Eye Contact:  NA  Speech:  Clear and Coherent  Volume:  Normal  Mood:  "pretty good"  Affect:  NA  Thought Process:  Coherent  Orientation:  Full (Time, Place, and Person)  Thought Content: Logical   Suicidal Thoughts:  No  Homicidal Thoughts:  No  Memory:  Immediate;   Fair  Judgement:  Good  Insight:  Present  Psychomotor Activity:  Normal  Concentration:  Concentration: Fair and Attention Span: Fair  Recall:  [place] of Knowledge: Good  Language: Good  Akathisia:  No  Handed:  Right  AIMS (if indicated): not done  Assets:  Social Support  ADL's:  Intact  Cognition: Impaired,  Mild  Sleep:  Good   Screenings:   Assessment and Plan:  Ricky Ward is a 69 y.o. year old male with a history of cognitive disorder, bipolar disorder by history, alcohol use in sustained remission, hypothyroidism , who presents for follow up appointment for Bipolar disorder in full remission, most recent episode unspecified type ([place]). His presentation at the end of December and last week most likely appears to be in the context of perhaps a delirious process in the context of her COVID or COVID itself. He appears to be returning to his baseline this week based on collaterals and he did not appear overtly manic, psychotic, depressed during the appointment today.   # Cognitive impairment He continues to demonstrate linear thought process on today's evaluation.  Behavioral cocerns expressed by staff last week appears to have improved and he seems to be returning to his baseline per collateral.    # Bipolar disorder by history in  remission He does not appear overtly manic, psycotic or depressed  at this time. As mentioned above his presentation  at the end of December and last week most likely appears to be in the context of perhaps a delirious process in the context of her COVID or COVID itself vs med changes. He seems to be returning to his baseline. Lithium was tapered down given his elevated creatinine and his creatinine seems to be trending dwon from 1.68 on 11/24 to 1.38 on 12/25. His AST was at 59 on 12/25 rest of the labs appeared stable.   We will continue quetiapine for mood dysregulation.  Discussed potential metabolic side effect.  We will continue Paxil for depression.  Noted that most recent admission is in 2016;it is difficult to discern if the patient was actually experiencing hypomanic episode, although bipolardiagnosis was documented in the chart (he had insomnia, crying spells, helplessness, loss of appetite and lost 30 lbs in three months).   Plan I have reviewed and updated plans as below 1. Continue Quetiapine 450 mg at night 2.Continue Depakote 250 mg QHS  3. Continue Paroxetine 20 mg daily 4.Next appointment: Pt to follow up with Dr. Modesta Messing in February.     The patient demonstrates the following risk factors for suicide: Chronic risk factors for suicide include:psychiatric disorder ofbipolar disorder. Acute risk factorsfor suicide include: unemployment and social withdrawal/isolation. Protective factorsfor this patient include: positive social support and hope for the future. Considering these factors, the overall suicide risk at this point appears to below. Patientisappropriate for outpatient follow up.  Orlene Erm, MD 02/27/2019, 10:54 AM

## 2019-03-03 DIAGNOSIS — R278 Other lack of coordination: Secondary | ICD-10-CM | POA: Diagnosis not present

## 2019-03-04 DIAGNOSIS — H53021 Refractive amblyopia, right eye: Secondary | ICD-10-CM | POA: Diagnosis not present

## 2019-03-04 DIAGNOSIS — H25813 Combined forms of age-related cataract, bilateral: Secondary | ICD-10-CM | POA: Diagnosis not present

## 2019-03-07 DIAGNOSIS — F319 Bipolar disorder, unspecified: Secondary | ICD-10-CM | POA: Diagnosis not present

## 2019-03-07 DIAGNOSIS — E039 Hypothyroidism, unspecified: Secondary | ICD-10-CM | POA: Diagnosis not present

## 2019-03-11 ENCOUNTER — Ambulatory Visit: Payer: Medicare Other | Admitting: Psychiatry

## 2019-03-11 NOTE — Progress Notes (Signed)
Referring Provider: Rosita Fire, MD Primary Care Physician:  Rosita Fire, MD Primary GI Physician: Dr. Oneida Alar  Chief Complaint  Patient presents with  . Colonoscopy    HPI:   Ricky Ward is a 69 y.o. male presenting today to discuss rescheduling his colonoscopy.  He was last seen on 04/28/2018 to schedule his first ever colonoscopy.  He reported history of constipation that was well managed on senna.  No other upper or lower GI symptoms.  Patient was scheduled for colonoscopy on 09/11/2018 but wanted to cancel due to Covid pandemic.  Today:   Doesn't want to have a colonoscopy.   States his bowel are doing fine. Having a BM daily. Taking senna to help with this. No constipation or diarrhea. Occasional gas. Trying to lose weight. States he isn't eating as much. Trying to walk up and down the hall more. Denies abdominal pain. No blood in the stool. No black stools. No nausea or vomiting. Occasional heartburn. Occurs rarely. Triggered by spicy foods. Drinks pepsi all day. States Pepsi helps his heartburn. No dysphagia.   No fever. Occasional chills. If he is cold, his nose will run. No sore throat. No cough. No pre-syncope or syncope. No chest pain. No heart palpitations. Occasional shortness of breath.    Past Medical History:  Diagnosis Date  . Bipolar 1 disorder ([place])   . Constipation   . Hypercholesterolemia   . Psoriasis   . Thyroid disease     Past Surgical History:  Procedure Laterality Date  . FOOT SURGERY  08/23/2003  . WRIST FRACTURE SURGERY Left     Current Outpatient Medications  Medication Sig Dispense Refill  . atorvastatin (LIPITOR) 20 MG tablet Take 20 mg by mouth daily.    . Calcipotriene-Betameth Diprop (ENSTILAR) 0.005-0.064 % FOAM Apply topically. Use as directed    . clotrimazole-betamethasone (LOTRISONE) cream Apply 1 application topically 2 (two) times daily as needed (rash).    . divalproex (DEPAKOTE) 250 MG DR tablet Take 1 tablet (250 mg  total) by mouth at bedtime. 90 tablet 0  . dronabinol (MARINOL) 2.5 MG capsule Take 2.5 mg by mouth 2 (two) times daily before a meal.    . guaiFENesin (ROBITUSSIN) 100 MG/5ML liquid Take 200 mg by mouth every 6 (six) hours as needed for cough.    . hydrocortisone 2.5 % cream Apply 1 application topically daily. *Apply to face, scalp, neck, and behind the ears    . ketoconazole (NIZORAL) 2 % cream Apply 1 application topically daily. Applied to the face, scalp, neck, and behind the ears    . ketoconazole (NIZORAL) 2 % shampoo Apply 1 application topically daily.    Marland Kitchen levothyroxine (SYNTHROID) 88 MCG tablet Take 88 mcg by mouth daily before breakfast.     . Magnesium Oxide 400 (240 Mg) MG TABS Take 1 tablet by mouth 2 (two) times daily.    Marland Kitchen PARoxetine (PAXIL) 20 MG tablet Take 1 tablet (20 mg total) by mouth daily. 90 tablet 1  . QUEtiapine (SEROQUEL) 400 MG tablet Take 1 tablet (400 mg total) by mouth at bedtime. 90 tablet 1  . QUEtiapine (SEROQUEL) 50 MG tablet Take 1 tablet (50 mg total) by mouth at bedtime. 30 tablet 0  . sennosides-docusate sodium (SENOKOT-S) 8.6-50 MG tablet Take 1 tablet by mouth daily as needed for constipation.    Marland Kitchen acetaminophen (TYLENOL) 650 MG CR tablet Take 650 mg by mouth every 8 (eight) hours as needed for pain. Takes tylenol arthritis  650 mg as needed    . famotidine (PEPCID) 20 MG tablet Take 1 tablet (20 mg total) by mouth as needed for heartburn or indigestion (May use once to twice daily). 60 tablet 5   No current facility-administered medications for this visit.    Allergies as of 03/12/2019 - Review Complete 03/12/2019  Allergen Reaction Noted  . Penicillins Swelling 08/27/2011    Family History  Problem Relation Age of Onset  . Alcohol abuse Brother   . Colon cancer Neg Hx     Social History   Socioeconomic History  . Marital status: Divorced    Spouse name: Not on file  . Number of children: Not on file  . Years of education: Not on file  .  Highest education level: Not on file  Occupational History  . Not on file  Tobacco Use  . Smoking status: Former Smoker    Types: Cigarettes  . Smokeless tobacco: Never Used  Substance and Sexual Activity  . Alcohol use: No  . Drug use: No  . Sexual activity: Not on file  Other Topics Concern  . Not on file  Social History Narrative  . Not on file   Social Determinants of Health   Financial Resource Strain:   . Difficulty of Paying Living Expenses: Not on file  Food Insecurity:   . Worried About Charity fundraiser in the Last Year: Not on file  . Ran Out of Food in the Last Year: Not on file  Transportation Needs:   . Lack of Transportation (Medical): Not on file  . Lack of Transportation (Non-Medical): Not on file  Physical Activity:   . Days of Exercise per Week: Not on file  . Minutes of Exercise per Session: Not on file  Stress:   . Feeling of Stress : Not on file  Social Connections:   . Frequency of Communication with Friends and Family: Not on file  . Frequency of Social Gatherings with Friends and Family: Not on file  . Attends Religious Services: Not on file  . Active Member of Clubs or Organizations: Not on file  . Attends Archivist Meetings: Not on file  . Marital Status: Not on file    Review of Systems: Gen: See HPI CV: See HPI Resp: See HPI GI: See HPI Derm: Has psoriasis.  Psych: Denies depression, anxiety.   Heme: See HPI   Physical Exam: BP 111/60   Pulse 98   Temp (!) 96.2 F (35.7 C) (Temporal)   Ht 5\' 7"  (1.702 m)   Wt 162 lb 9.6 oz (73.8 kg)   BMI 25.47 kg/m  General:   Alert and oriented. No distress noted. Pleasant and cooperative.  Head:  Normocephalic and atraumatic.  Eyes:  Conjuctiva clear without scleral icterus. Heart:  S1, S2 present without murmurs appreciated. Lungs:  Clear to auscultation bilaterally. No wheezes, rales, or rhonchi. No distress.  Abdomen:  Protuberant. +BS, soft, non-tender and non-distended.  No rebound or guarding. No HSM or masses noted. Msk:  Symmetrical without gross deformities. Normal posture. Extremities:  Without edema. Neurologic:  Alert and  oriented x4 Psych: Normal mood and affect. Answer appropriately. Seems to have appropriate insight to his health.

## 2019-03-12 ENCOUNTER — Encounter: Payer: Self-pay | Admitting: Gastroenterology

## 2019-03-12 ENCOUNTER — Ambulatory Visit (INDEPENDENT_AMBULATORY_CARE_PROVIDER_SITE_OTHER): Payer: Medicare Other | Admitting: Gastroenterology

## 2019-03-12 ENCOUNTER — Other Ambulatory Visit: Payer: Self-pay

## 2019-03-12 VITALS — BP 111/60 | HR 98 | Temp 96.2°F | Ht 67.0 in | Wt 162.6 lb

## 2019-03-12 DIAGNOSIS — R12 Heartburn: Secondary | ICD-10-CM

## 2019-03-12 DIAGNOSIS — Z0000 Encounter for general adult medical examination without abnormal findings: Secondary | ICD-10-CM

## 2019-03-12 MED ORDER — FAMOTIDINE 20 MG PO TABS: 20.0000 mg | ORAL_TABLET | ORAL | 5 refills | Status: AC | PRN

## 2019-03-12 NOTE — Patient Instructions (Signed)
I have sent in a prescription for Pepcid 20 mg.  You can take this daily as needed for heartburn symptoms.  You may continue to get this prescription refilled through your primary care.  If you begin to have worsening heartburn or reflux symptoms, you can always let us know.  Avoid triggers of heartburn.  These include fried, fatty, greasy, spicy foods.  This also includes carbonated beverages, caffeine, and chocolate.  Please call us when you are ready to schedule your colonoscopy.  We will follow up with you as needed.  Call if you have questions or concerns.  Aliene Altes, PA-C [facility]

## 2019-03-12 NOTE — Assessment & Plan Note (Addendum)
Intermittent/rare heartburn. Likely dietary related.  No other significant upper GI symptoms. No alarm symptoms. Discussed using Pepcid 20 mg daily as needed. He lives at [facility]; therefore, will require a prescription.   Rx for Pepcid 20 mg up to twice daily as needed sent to pharmacy.  Advised to avoid triggers of heartburn including fried, fatty, greasy, spicy foods, carbonated beverages, caffeine, and chocolate. He was advised to continue to monitor symptoms. If heartburn worsened/became more frequent or if he developed other upper GI trouble, he was advised to reach back out to [place]. Otherwise, do not think it is necessary to see patient back for this. He can have prescription refilled trough PCP.  Follow-up as needed.

## 2019-03-12 NOTE — Progress Notes (Signed)
Cc'ed to pcp °

## 2019-03-12 NOTE — Assessment & Plan Note (Addendum)
69 year old male with history of bipolar disorder, hypothyroidism, HLD, and constipation well managed on senna presenting at request of PCP for colonoscopy.  No prior colonoscopy.  No significant lower GI symptoms.  No alarm symptoms. States he is losing weight intentionally through decreasing his intake and walking more.  No family history of colon cancer.  Patient does not want to have a colonoscopy at this time.  Discussed risks of not having a colonoscopy.  Patient voices understanding and again declined colonoscopy.   He was advised to call us when he is ready to schedule his colonoscopy.  Otherwise, we will follow-up with him as needed.

## 2019-03-25 ENCOUNTER — Telehealth (HOSPITAL_COMMUNITY): Payer: Self-pay | Admitting: *Deleted

## 2019-03-25 DIAGNOSIS — F3170 Bipolar disorder, currently in remission, most recent episode unspecified: Secondary | ICD-10-CM

## 2019-03-25 NOTE — Addendum Note (Signed)
Addended by: [place] Crane on: 03/25/2019 01:04 PM   Modules accepted: Orders

## 2019-03-25 NOTE — Telephone Encounter (Signed)
Order placed

## 2019-03-25 NOTE — Telephone Encounter (Signed)
[facility] [phone] CALLED REQUESTING LAB ORDER FOR VALPROIC ACID LEVEL(QUEST LAB [place])

## 2019-04-01 DIAGNOSIS — M79674 Pain in right toe(s): Secondary | ICD-10-CM | POA: Diagnosis not present

## 2019-04-01 DIAGNOSIS — M79675 Pain in left toe(s): Secondary | ICD-10-CM | POA: Diagnosis not present

## 2019-04-01 DIAGNOSIS — B351 Tinea unguium: Secondary | ICD-10-CM | POA: Diagnosis not present

## 2019-04-04 ENCOUNTER — Emergency Department (HOSPITAL_COMMUNITY): Payer: Medicare Other

## 2019-04-04 ENCOUNTER — Other Ambulatory Visit: Payer: Self-pay

## 2019-04-04 ENCOUNTER — Inpatient Hospital Stay (HOSPITAL_COMMUNITY)
Admission: EM | Admit: 2019-04-04 | Discharge: 2019-04-07 | DRG: 177 | Disposition: A | Discharge status: Skilled Nursing Facility | Payer: Medicare Other | Source: Skilled Nursing Facility | Attending: Family Medicine | Admitting: Family Medicine

## 2019-04-04 ENCOUNTER — Encounter (HOSPITAL_COMMUNITY): Payer: Self-pay | Admitting: *Deleted

## 2019-04-04 DIAGNOSIS — U071 COVID-19: Secondary | ICD-10-CM | POA: Diagnosis not present

## 2019-04-04 DIAGNOSIS — N400 Enlarged prostate without lower urinary tract symptoms: Secondary | ICD-10-CM | POA: Diagnosis present

## 2019-04-04 DIAGNOSIS — R069 Unspecified abnormalities of breathing: Secondary | ICD-10-CM | POA: Diagnosis not present

## 2019-04-04 DIAGNOSIS — E7800 Pure hypercholesterolemia, unspecified: Secondary | ICD-10-CM | POA: Diagnosis present

## 2019-04-04 DIAGNOSIS — R339 Retention of urine, unspecified: Secondary | ICD-10-CM | POA: Diagnosis present

## 2019-04-04 DIAGNOSIS — N401 Enlarged prostate with lower urinary tract symptoms: Secondary | ICD-10-CM

## 2019-04-04 DIAGNOSIS — R1310 Dysphagia, unspecified: Secondary | ICD-10-CM

## 2019-04-04 DIAGNOSIS — E039 Hypothyroidism, unspecified: Secondary | ICD-10-CM | POA: Diagnosis present

## 2019-04-04 DIAGNOSIS — R000 Tachycardia, unspecified: Secondary | ICD-10-CM | POA: Diagnosis not present

## 2019-04-04 DIAGNOSIS — Z79899 Other long term (current) drug therapy: Secondary | ICD-10-CM

## 2019-04-04 DIAGNOSIS — R05 Cough: Secondary | ICD-10-CM | POA: Diagnosis not present

## 2019-04-04 DIAGNOSIS — R0902 Hypoxemia: Secondary | ICD-10-CM | POA: Diagnosis not present

## 2019-04-04 DIAGNOSIS — Z8616 Personal history of COVID-19: Secondary | ICD-10-CM | POA: Diagnosis not present

## 2019-04-04 DIAGNOSIS — Z880 Allergy status to penicillin: Secondary | ICD-10-CM

## 2019-04-04 DIAGNOSIS — R338 Other retention of urine: Secondary | ICD-10-CM | POA: Diagnosis not present

## 2019-04-04 DIAGNOSIS — F3170 Bipolar disorder, currently in remission, most recent episode unspecified: Secondary | ICD-10-CM

## 2019-04-04 DIAGNOSIS — J690 Pneumonitis due to inhalation of food and vomit: Principal | ICD-10-CM | POA: Diagnosis present

## 2019-04-04 DIAGNOSIS — A419 Sepsis, unspecified organism: Secondary | ICD-10-CM

## 2019-04-04 DIAGNOSIS — F319 Bipolar disorder, unspecified: Secondary | ICD-10-CM | POA: Diagnosis present

## 2019-04-04 DIAGNOSIS — J9601 Acute respiratory failure with hypoxia: Secondary | ICD-10-CM | POA: Diagnosis present

## 2019-04-04 DIAGNOSIS — R3914 Feeling of incomplete bladder emptying: Secondary | ICD-10-CM

## 2019-04-04 DIAGNOSIS — Z87891 Personal history of nicotine dependence: Secondary | ICD-10-CM

## 2019-04-04 DIAGNOSIS — E785 Hyperlipidemia, unspecified: Secondary | ICD-10-CM | POA: Diagnosis present

## 2019-04-04 LAB — COMPREHENSIVE METABOLIC PANEL
ALT: 15 U/L (ref 0–44)
AST: 21 U/L (ref 15–41)
Albumin: 3.7 g/dL (ref 3.5–5.0)
Alkaline Phosphatase: 81 U/L (ref 38–126)
Anion gap: 12 (ref 5–15)
BUN: 19 mg/dL (ref 8–23)
CO2: 23 mmol/L (ref 22–32)
Calcium: 8.5 mg/dL — ABNORMAL LOW (ref 8.9–10.3)
Chloride: 102 mmol/L (ref 98–111)
Creatinine, Ser: 1.34 mg/dL — ABNORMAL HIGH (ref 0.61–1.24)
GFR calc Af Amer: >60 mL/min (ref >60)
GFR calc non Af Amer: 53 mL/min — ABNORMAL LOW (ref >60)
Glucose, Bld: 148 mg/dL — ABNORMAL HIGH (ref 70–99)
Potassium: 4.1 mmol/L (ref 3.5–5.1)
Sodium: 137 mmol/L (ref 135–145)
Total Bilirubin: 1.2 mg/dL (ref 0.3–1.2)
Total Protein: 7.8 g/dL (ref 6.5–8.1)

## 2019-04-04 LAB — URINALYSIS, ROUTINE W REFLEX MICROSCOPIC
Bacteria, UA: NONE SEEN
Bilirubin Urine: NEGATIVE
Glucose, UA: NEGATIVE mg/dL
Hgb urine dipstick: NEGATIVE
Ketones, ur: 5 mg/dL — AB
Leukocytes,Ua: NEGATIVE
Nitrite: NEGATIVE
Protein, ur: 30 mg/dL — AB
Specific Gravity, Urine: 1.027 (ref 1.005–1.030)
pH: 5 (ref 5.0–8.0)

## 2019-04-04 LAB — CBC WITH DIFFERENTIAL/PLATELET
Abs Immature Granulocytes: 0.06 10*3/uL (ref 0.00–0.07)
Basophils Absolute: 0.1 10*3/uL (ref 0.0–0.1)
Basophils Relative: 0 %
Eosinophils Absolute: 0 10*3/uL (ref 0.0–0.5)
Eosinophils Relative: 0 %
HCT: 44.3 % (ref 39.0–52.0)
Hemoglobin: 14.6 g/dL (ref 13.0–17.0)
Immature Granulocytes: 0 %
Lymphocytes Relative: 6 %
Lymphs Abs: 0.9 10*3/uL (ref 0.7–4.0)
MCH: 32.2 pg (ref 26.0–34.0)
MCHC: 33 g/dL (ref 30.0–36.0)
MCV: 97.8 fL (ref 80.0–100.0)
Monocytes Absolute: 1.2 10*3/uL — ABNORMAL HIGH (ref 0.1–1.0)
Monocytes Relative: 9 %
Neutro Abs: 11.4 10*3/uL — ABNORMAL HIGH (ref 1.7–7.7)
Neutrophils Relative %: 85 %
Platelets: 250 10*3/uL (ref 150–400)
RBC: 4.53 MIL/uL (ref 4.22–5.81)
RDW: 15 % (ref 11.5–15.5)
WBC: 13.7 10*3/uL — ABNORMAL HIGH (ref 4.0–10.5)
nRBC: 0 % (ref 0.0–0.2)

## 2019-04-04 LAB — GLUCOSE, CAPILLARY: Glucose-Capillary: 161 mg/dL — ABNORMAL HIGH (ref 70–99)

## 2019-04-04 LAB — APTT: aPTT: 37 seconds — ABNORMAL HIGH (ref 24–36)

## 2019-04-04 LAB — LACTIC ACID, PLASMA
Lactic Acid, Venous: 1.6 mmol/L (ref 0.5–1.9)
Lactic Acid, Venous: 1.2 mmol/L (ref 0.5–1.9)

## 2019-04-04 LAB — PROTIME-INR
INR: 1 (ref 0.8–1.2)
Prothrombin Time: 13.3 seconds (ref 11.4–15.2)

## 2019-04-04 LAB — MRSA PCR SCREENING: MRSA by PCR: NEGATIVE

## 2019-04-04 MED ORDER — SODIUM CHLORIDE 0.9 % IV SOLN
2.0000 g | Freq: Three times a day (TID) | INTRAVENOUS | Status: DC
  Filled 2019-04-04 (×7): qty 2

## 2019-04-04 MED ORDER — IPRATROPIUM-ALBUTEROL 0.5-2.5 (3) MG/3ML IN SOLN
3.0000 mL | Freq: Four times a day (QID) | RESPIRATORY_TRACT | Status: DC
  Administered 2019-04-04 – 2019-04-07 (×12): 3 mL via RESPIRATORY_TRACT
  Filled 2019-04-04 (×13): qty 3

## 2019-04-04 MED ORDER — VANCOMYCIN HCL 750 MG/150ML IV SOLN
750.0000 mg | Freq: Two times a day (BID) | INTRAVENOUS | Status: DC
  Administered 2019-04-05: 750 mg via INTRAVENOUS
  Filled 2019-04-04 (×5): qty 150

## 2019-04-04 MED ORDER — SODIUM CHLORIDE 0.9 % IV BOLUS
1000.0000 mL | Freq: Once | INTRAVENOUS | Status: AC
  Administered 2019-04-04: 11:00:00 1000 mL via INTRAVENOUS

## 2019-04-04 MED ORDER — SODIUM CHLORIDE 0.9 % IV SOLN
2.0000 g | Freq: Once | INTRAVENOUS | Status: AC
  Administered 2019-04-04: 2 g via INTRAVENOUS
  Filled 2019-04-04: qty 2

## 2019-04-04 MED ORDER — ACETAMINOPHEN 10 MG/ML IV SOLN
1000.0000 mg | Freq: Four times a day (QID) | INTRAVENOUS | Status: AC
  Administered 2019-04-04 – 2019-04-05 (×4): 1000 mg via INTRAVENOUS
  Filled 2019-04-04 (×6): qty 100

## 2019-04-04 MED ORDER — CHLORHEXIDINE GLUCONATE CLOTH 2 % EX PADS
6.0000 | MEDICATED_PAD | Freq: Every day | CUTANEOUS | Status: DC
  Administered 2019-04-05 – 2019-04-06 (×2): 6 via TOPICAL

## 2019-04-04 MED ORDER — SODIUM CHLORIDE 0.9 % IV SOLN: INTRAVENOUS | Status: DC

## 2019-04-04 MED ORDER — ORAL CARE MOUTH RINSE
15.0000 mL | Freq: Two times a day (BID) | OROMUCOSAL | Status: DC
  Administered 2019-04-04 – 2019-04-07 (×6): 15 mL via OROMUCOSAL

## 2019-04-04 MED ORDER — ENOXAPARIN SODIUM 40 MG/0.4ML ~~LOC~~ SOLN
40.0000 mg | SUBCUTANEOUS | Status: DC
  Administered 2019-04-04 – 2019-04-06 (×3): 40 mg via SUBCUTANEOUS
  Filled 2019-04-04 (×3): qty 0.4

## 2019-04-04 MED ORDER — ACETAMINOPHEN 325 MG PO TABS
650.0000 mg | ORAL_TABLET | Freq: Once | ORAL | Status: AC
  Administered 2019-04-04: 650 mg via ORAL
  Filled 2019-04-04: qty 2

## 2019-04-04 MED ORDER — LEVOFLOXACIN IN D5W 750 MG/150ML IV SOLN
750.0000 mg | INTRAVENOUS | Status: DC
  Administered 2019-04-04: 750 mg via INTRAVENOUS
  Filled 2019-04-04 (×2): qty 150

## 2019-04-04 MED ORDER — SODIUM CHLORIDE 0.9 % IV SOLN: 500.0000 mg | INTRAVENOUS | Status: DC

## 2019-04-04 MED ORDER — VANCOMYCIN HCL 1500 MG/300ML IV SOLN
1500.0000 mg | Freq: Once | INTRAVENOUS | Status: AC
  Administered 2019-04-04: 1500 mg via INTRAVENOUS
  Filled 2019-04-04: qty 300

## 2019-04-04 NOTE — ED Notes (Signed)
Pt placed on oxygen at 2lpm via [place] with increase in pulse ox to 97%.

## 2019-04-04 NOTE — ED Notes (Signed)
Patient stated he is unable void at this time

## 2019-04-04 NOTE — Progress Notes (Addendum)
Pharmacy Antibiotic Note  Ricky Ward is a 69 y.o. male admitted on 04/04/2019 with pneumonia.  Pharmacy has been consulted for vancomycin and aztreonam dosing.  Plan: Vancomycin 750mg  IV every 12 hours.  Goal trough 15-20 mcg/mL. Aztreonam 2gm iv q8h   Height: 5\' 7"  (170.2 cm) Weight: 171 lb (77.6 kg) IBW/kg (Calculated) : 66.1  Temp (24hrs), Avg:101.2 F (38.4 C), Min:99.8 F (37.7 C), Max:101.8 F (38.8 C)  Recent Labs  Lab 04/04/19 0950 04/04/19 1143  WBC 13.7*  --   CREATININE 1.34*  --   LATICACIDVEN 1.6 1.2    Estimated Creatinine Clearance: 48 mL/min (A) (by C-G formula based on SCr of 1.34 mg/dL (H)).    Allergies  Allergen Reactions  . Penicillins Swelling    Has patient had a PCN reaction causing immediate rash, facial/tongue/throat swelling, SOB or lightheadedness with hypotension: Unknown Has patient had a PCN reaction causing severe rash involving mucus membranes or skin necrosis: Unknown Has patient had a PCN reaction that required hospitalization: Unknown Has patient had a PCN reaction occurring within the last 10 years: No If all of the above answers are "NO", then may proceed with Cephalosporin use.     Antimicrobials this admission: 2/20 Vancomycin >>  2/20 aztreonam >>   Microbiology results: 2/20 BCx: sent 2/20 UCx: sent  2/20 MRSA PCR: sent  Thank you for allowing pharmacy to be a part of this patient's care.  Donna Christen Findley Vi 04/04/2019 1:56 PM

## 2019-04-04 NOTE — H&P (Signed)
History and Physical    Ricky Ward [id number] DOB: 10-04-1950 DOA: 04/04/2019  PCP: Rosita Fire, MD  Patient coming from: [facility]  I have personally briefly reviewed patient's old medical records in [place]  Chief Complaint: cough, fever  HPI: Ricky Ward is a 69 y.o. male with medical history significant of bipolar disorder, hypothyroidism, hyperlipidemia, who is a resident of an assisted living facility.  Patient was diagnosed with COVID-19 in 01/2019.  He was not hospitalized at that time since he did not have any significant hypoxia.  He is brought to the hospital today with worsening cough and shortness of breath he reports onset of symptoms over the past 48 hours.  He has been congested, coughing, he is noted to be febrile.  He is not had any chest pain.  He is not had any nausea or vomiting..  ED Course: He was evaluated in the emergency room return to be febrile, tachycardic.  He is hypoxic on room air and supplemental oxygen was applied.  He is up to 6 L of oxygen.  Chest x-ray did not show any clear-cut pneumonia, but he is repeatedly coughing.  He received Tylenol for fever, and staff noticed that he had significant difficulty swallowing his pills.  Review of Systems: As per HPI otherwise 10 point review of systems negative.    Past Medical History:  Diagnosis Date  . Bipolar 1 disorder ([place])   . Constipation   . Hypercholesterolemia   . Psoriasis   . Thyroid disease     Past Surgical History:  Procedure Laterality Date  . FOOT SURGERY  08/23/2003  . WRIST FRACTURE SURGERY Left     Social History:  reports that he has quit smoking. His smoking use included cigarettes. He has never used smokeless tobacco. He reports that he does not drink alcohol or use drugs.  Allergies  Allergen Reactions  . Penicillins Swelling    Has patient had a PCN reaction causing immediate rash, facial/tongue/throat swelling, SOB or lightheadedness with  hypotension: Unknown Has patient had a PCN reaction causing severe rash involving mucus membranes or skin necrosis: Unknown Has patient had a PCN reaction that required hospitalization: Unknown Has patient had a PCN reaction occurring within the last 10 years: No If all of the above answers are "NO", then may proceed with Cephalosporin use.     Family History  Problem Relation Age of Onset  . Alcohol abuse Brother   . Colon cancer Neg Hx      Prior to Admission medications   Medication Sig Start Date End Date Taking? Authorizing Provider  acetaminophen (TYLENOL) 650 MG CR tablet Take 650 mg by mouth every 8 (eight) hours as needed for pain. Takes tylenol arthritis 650 mg as needed    [provider]  atorvastatin (LIPITOR) 20 MG tablet Take 20 mg by mouth daily.    [provider]  Calcipotriene-Betameth Diprop (ENSTILAR) 0.005-0.064 % FOAM Apply topically. Use as directed    [provider]  clotrimazole-betamethasone (LOTRISONE) cream Apply 1 application topically 2 (two) times daily as needed (rash).    [provider]  divalproex (DEPAKOTE) 250 MG DR tablet Take 1 tablet (250 mg total) by mouth at bedtime. 02/27/19   Orlene Erm, MD  dronabinol (MARINOL) 2.5 MG capsule Take 2.5 mg by mouth 2 (two) times daily before a meal.    [provider]  famotidine (PEPCID) 20 MG tablet Take 1 tablet (20 mg total) by  mouth as needed for heartburn or indigestion (May use once to twice daily). 03/12/19   Erenest Rasher, PA-C  guaiFENesin (ROBITUSSIN) 100 MG/5ML liquid Take 200 mg by mouth every 6 (six) hours as needed for cough.    [provider]  hydrocortisone 2.5 % cream Apply 1 application topically daily. *Apply to face, scalp, neck, and behind the ears    [provider]  ketoconazole (NIZORAL) 2 % cream Apply 1 application topically daily. Applied to the face, scalp, neck, and behind the ears    [provider]    ketoconazole (NIZORAL) 2 % shampoo Apply 1 application topically daily.    [provider]  levothyroxine (SYNTHROID) 88 MCG tablet Take 88 mcg by mouth daily before breakfast.     [provider]  Magnesium Oxide 400 (240 Mg) MG TABS Take 1 tablet by mouth 2 (two) times daily. 01/23/19   [provider]  PARoxetine (PAXIL) 20 MG tablet Take 1 tablet (20 mg total) by mouth daily. 02/27/19   Orlene Erm, MD  QUEtiapine (SEROQUEL) 400 MG tablet Take 1 tablet (400 mg total) by mouth at bedtime. 02/27/19   Orlene Erm, MD  QUEtiapine (SEROQUEL) 50 MG tablet Take 1 tablet (50 mg total) by mouth at bedtime. 02/27/19   Orlene Erm, MD  sennosides-docusate sodium (SENOKOT-S) 8.6-50 MG tablet Take 1 tablet by mouth daily as needed for constipation.    [provider]    Physical Exam: Vitals:   04/04/19 1345 04/04/19 1400 04/04/19 1438 04/04/19 1515  BP:  (!) 134/57    Pulse: (!) 113 (!) 110  (!) 133  Resp:  (!) 24    Temp:   (!) 100.9 F (38.3 C)   TempSrc:   Oral   SpO2: 92% 91%  100%  Weight:      Height:        Constitutional: NAD, calm, comfortable Eyes: PERRL, lids and conjunctivae normal ENMT: Mucous membranes are moist. Posterior pharynx clear of any exudate or lesions.Normal dentition.  Neck: normal, supple, no masses, no thyromegaly Respiratory: Bilateral rhonchi normal respiratory effort. No accessory muscle use.  Cardiovascular: Regular, tachycardic. No extremity edema. 2+ pedal pulses. No carotid bruits.  Abdomen: no tenderness, no masses palpated. No hepatosplenomegaly. Bowel sounds positive.  Musculoskeletal: no clubbing / cyanosis. No joint deformity upper and lower extremities. Good ROM, no contractures. Normal muscle tone.  Skin: no rashes, lesions, ulcers. No induration Neurologic: CN 2-12 grossly intact. Sensation intact, DTR normal. Strength 5/5 in all 4.  Psychiatric: Mild confusion, pleasant   Labs on Admission: I  have personally reviewed following labs and imaging studies  CBC: Recent Labs  Lab 04/04/19 0950  WBC 13.7*  NEUTROABS 11.4*  HGB 14.6  HCT 44.3  MCV 97.8  PLT [id number]   Basic Metabolic Panel: Recent Labs  Lab 04/04/19 0950  NA 137  K 4.1  CL 102  CO2 23  GLUCOSE 148*  BUN 19  CREATININE 1.34*  CALCIUM 8.5*   GFR: Estimated Creatinine Clearance: 48 mL/min (A) (by C-G formula based on SCr of 1.34 mg/dL (H)). Liver Function Tests: Recent Labs  Lab 04/04/19 0950  AST 21  ALT 15  ALKPHOS 81  BILITOT 1.2  PROT 7.8  ALBUMIN 3.7   No results for input(s): LIPASE, AMYLASE in the last 168 hours. No results for input(s): AMMONIA in the last 168 hours. Coagulation Profile: Recent Labs  Lab 04/04/19 0950  INR 1.0   Cardiac  Enzymes: No results for input(s): CKTOTAL, CKMB, CKMBINDEX, TROPONINI in the last 168 hours. BNP (last 3 results) No results for input(s): PROBNP in the last 8760 hours. HbA1C: No results for input(s): HGBA1C in the last 72 hours. CBG: No results for input(s): GLUCAP in the last 168 hours. Lipid Profile: No results for input(s): CHOL, HDL, LDLCALC, TRIG, CHOLHDL, LDLDIRECT in the last 72 hours. Thyroid Function Tests: No results for input(s): TSH, T4TOTAL, FREET4, T3FREE, THYROIDAB in the last 72 hours. Anemia Panel: No results for input(s): VITAMINB12, FOLATE, FERRITIN, TIBC, IRON, RETICCTPCT in the last 72 hours. Urine analysis:    Component Value Date/Time   COLORURINE YELLOW 04/04/2019 1303   APPEARANCEUR CLEAR 04/04/2019 1303   LABSPEC 1.027 04/04/2019 1303   PHURINE 5.0 04/04/2019 1303   GLUCOSEU NEGATIVE 04/04/2019 1303   HGBUR NEGATIVE 04/04/2019 1303   BILIRUBINUR NEGATIVE 04/04/2019 1303   KETONESUR 5 (A) 04/04/2019 1303   PROTEINUR 30 (A) 04/04/2019 1303   UROBILINOGEN 1.0 04/15/2013 2138   NITRITE NEGATIVE 04/04/2019 1303   LEUKOCYTESUR NEGATIVE 04/04/2019 1303    Radiological Exams on Admission: DG Chest Port 1  View  Result Date: 04/04/2019 CLINICAL DATA:  69 year old male with history of productive cough for the past 2 days. EXAM: PORTABLE CHEST 1 VIEW COMPARISON:  Chest x-ray 02/06/2019. FINDINGS: Lung volumes are normal. No consolidative airspace disease. No pleural effusions. No evidence of pulmonary edema. Heart size is normal. Large hiatal hernia. Upper mediastinal contours are within normal limits. Aortic atherosclerosis. IMPRESSION: 1. No radiographic evidence of acute cardiopulmonary disease. 2. Large hiatal hernia. 3. Aortic atherosclerosis. Electronically Signed   By: Vinnie Langton M.D.   On: 04/04/2019 10:33    EKG: Independently reviewed.  Sinus tachycardia  Assessment/Plan Active Problems:   Bipolar disorder in full remission (HCC)   Aspiration pneumonia (HCC)   Acute respiratory failure with hypoxia (HCC)   Dysphagia   Hypothyroidism   HLD (hyperlipidemia)     1. Acute respiratory failure with hypoxia secondary to pneumonia, suspect aspiration.  Started on broad-spectrum antibiotics.  Check blood cultures.  Check sputum culture and urinary antigens.  Continue supplemental oxygen.  Continue bronchodilators. 2. Dysphagia.  Likely related to aspiration.  Will get speech therapy evaluation.  Keep the patient n.p.o. for now. 3. Hypothyroidism.  Resume Synthroid once able to take p.o. 4. Hyperlipidemia.  Resume statin once able to take p.o. 5. Bipolar disorder.  He is on multiple psychotropic medications.  Will resume once he is able to take p.o.  DVT prophylaxis: lovenox  Code Status: full code  Family Communication: attempted to reach patient's sister, but unable to reach her over the phone  Disposition Plan: pending hospital course, may need placement at higher level of care  Consults called:   Admission status: inpatient, stepdown.  Based on patient's severe respiratory failure/hypoxia secondary to aspiration pneumonia, need for antibiotics and further evaluation by speech  therapy, anticipate will have to be in the hospital for at least 2 midnights since he is at high risk for decompensation.  Kathie Dike MD [facility]   If 7PM-7AM, please contact night-coverage [email or web address]   04/04/2019, 3:42 PM

## 2019-04-04 NOTE — ED Triage Notes (Signed)
Patient presents to the ED with a productive cough for 2 days.  Patient comes from [facility] with a history of Covid in December.

## 2019-04-04 NOTE — ED Notes (Signed)
Patient unable to void 

## 2019-04-04 NOTE — Progress Notes (Signed)
Started on flutter coughed up thick yellow secretions sent to lab

## 2019-04-04 NOTE — ED Provider Notes (Addendum)
[facility] EMERGENCY DEPARTMENT Provider Note   CSN: [id number] Arrival date & time: 04/04/19  [id number]     History Chief Complaint  Patient presents with  . Cough    Ricky Ward is a 69 y.o. male.  No language interpreter was used.  Fever Max temp prior to arrival:  101.5 Temp source:  Oral Severity:  Moderate Onset quality:  Gradual Duration:  1 day Timing:  Constant Progression:  Worsening Chronicity:  New Relieved by:  Nothing Worsened by:  Nothing Ineffective treatments:  None tried Associated symptoms: congestion and cough   Risk factors: no recent sickness        Past Medical History:  Diagnosis Date  . Bipolar 1 disorder ([place])   . Constipation   . Hypercholesterolemia   . Psoriasis   . Thyroid disease     Patient Active Problem List   Diagnosis Date Noted  . Heartburn 03/12/2019  . Preventative health care 04/28/2018  . Bipolar disorder in full remission ([place]) 10/09/2017  . Alcohol use disorder, mild, in sustained remission 10/09/2017    Past Surgical History:  Procedure Laterality Date  . FOOT SURGERY  08/23/2003  . WRIST FRACTURE SURGERY Left        Family History  Problem Relation Age of Onset  . Alcohol abuse Brother   . Colon cancer Neg Hx     Social History   Tobacco Use  . Smoking status: Former Smoker    Types: Cigarettes  . Smokeless tobacco: Never Used  Substance Use Topics  . Alcohol use: No  . Drug use: No    Home Medications Prior to Admission medications   Medication Sig Start Date End Date Taking? Authorizing Provider  acetaminophen (TYLENOL) 650 MG CR tablet Take 650 mg by mouth every 8 (eight) hours as needed for pain. Takes tylenol arthritis 650 mg as needed    [provider]  atorvastatin (LIPITOR) 20 MG tablet Take 20 mg by mouth daily.    [provider]  Calcipotriene-Betameth Diprop (ENSTILAR) 0.005-0.064 % FOAM Apply topically. Use as directed    [provider]   clotrimazole-betamethasone (LOTRISONE) cream Apply 1 application topically 2 (two) times daily as needed (rash).    [provider]  divalproex (DEPAKOTE) 250 MG DR tablet Take 1 tablet (250 mg total) by mouth at bedtime. 02/27/19   Orlene Erm, MD  dronabinol (MARINOL) 2.5 MG capsule Take 2.5 mg by mouth 2 (two) times daily before a meal.    [provider]  famotidine (PEPCID) 20 MG tablet Take 1 tablet (20 mg total) by mouth as needed for heartburn or indigestion (May use once to twice daily). 03/12/19   Erenest Rasher, PA-C  guaiFENesin (ROBITUSSIN) 100 MG/5ML liquid Take 200 mg by mouth every 6 (six) hours as needed for cough.    [provider]  hydrocortisone 2.5 % cream Apply 1 application topically daily. *Apply to face, scalp, neck, and behind the ears    [provider]  ketoconazole (NIZORAL) 2 % cream Apply 1 application topically daily. Applied to the face, scalp, neck, and behind the ears    [provider]  ketoconazole (NIZORAL) 2 % shampoo Apply 1 application topically daily.    [provider]  levothyroxine (SYNTHROID) 88 MCG tablet Take 88 mcg by mouth daily before breakfast.     [provider]  Magnesium Oxide 400 (240 Mg) MG TABS Take 1 tablet by mouth 2 (two) times daily. 01/23/19  [provider]  PARoxetine (PAXIL) 20 MG tablet Take 1 tablet (20 mg total) by mouth daily. 02/27/19   Orlene Erm, MD  QUEtiapine (SEROQUEL) 400 MG tablet Take 1 tablet (400 mg total) by mouth at bedtime. 02/27/19   Orlene Erm, MD  QUEtiapine (SEROQUEL) 50 MG tablet Take 1 tablet (50 mg total) by mouth at bedtime. 02/27/19   Orlene Erm, MD  sennosides-docusate sodium (SENOKOT-S) 8.6-50 MG tablet Take 1 tablet by mouth daily as needed for constipation.    [provider]    Allergies    Penicillins  Review of Systems   Review of Systems  Constitutional: Positive for fever.  HENT: Positive  for congestion.   Respiratory: Positive for cough.   All other systems reviewed and are negative.   Physical Exam Updated Vital Signs BP 124/68   Pulse (!) 110   Temp 99.8 F (37.7 C) (Oral)   Resp (!) 22   Ht 5\' 7"  (1.702 m)   Wt 77.6 kg   SpO2 97%   BMI 26.78 kg/m   Physical Exam Vitals and nursing note reviewed.  Constitutional:      Appearance: He is well-developed.  HENT:     Head: Normocephalic and atraumatic.     Mouth/Throat:     Mouth: Mucous membranes are moist.  Eyes:     Conjunctiva/sclera: Conjunctivae normal.  Cardiovascular:     Rate and Rhythm: Normal rate and regular rhythm.     Heart sounds: No murmur.  Pulmonary:     Effort: Pulmonary effort is normal. No respiratory distress.     Breath sounds: Normal breath sounds.  Abdominal:     General: Abdomen is flat.     Palpations: Abdomen is soft.     Tenderness: There is no abdominal tenderness.  Musculoskeletal:        General: Normal range of motion.     Cervical back: Normal range of motion and neck supple.  Skin:    General: Skin is warm and dry.  Neurological:     General: No focal deficit present.     Mental Status: He is alert.  Psychiatric:        Mood and Affect: Mood normal.     ED Results / Procedures / Treatments   Labs (all labs ordered are listed, but only abnormal results are displayed) Labs Reviewed  COMPREHENSIVE METABOLIC PANEL - Abnormal; Notable for the following components:      Result Value   Glucose, Bld 148 (*)    Creatinine, Ser 1.34 (*)    Calcium 8.5 (*)    GFR calc non Af Amer 53 (*)    All other components within normal limits  CBC WITH DIFFERENTIAL/PLATELET - Abnormal; Notable for the following components:   WBC 13.7 (*)    Neutro Abs 11.4 (*)    Monocytes Absolute 1.2 (*)    All other components within normal limits  APTT - Abnormal; Notable for the following components:   aPTT 37 (*)    All other components within normal limits  CULTURE, BLOOD (ROUTINE  X 2)  CULTURE, BLOOD (ROUTINE X 2)  URINE CULTURE  LACTIC ACID, PLASMA  PROTIME-INR  LACTIC ACID, PLASMA  URINALYSIS, ROUTINE W REFLEX MICROSCOPIC    EKG None  Radiology DG Chest Port 1 View  Result Date: 04/04/2019 CLINICAL DATA:  69 year old male with history of productive cough for the past 2 days. EXAM: PORTABLE CHEST 1 VIEW COMPARISON:  Chest x-ray 02/06/2019.  FINDINGS: Lung volumes are normal. No consolidative airspace disease. No pleural effusions. No evidence of pulmonary edema. Heart size is normal. Large hiatal hernia. Upper mediastinal contours are within normal limits. Aortic atherosclerosis. IMPRESSION: 1. No radiographic evidence of acute cardiopulmonary disease. 2. Large hiatal hernia. 3. Aortic atherosclerosis. Electronically Signed   By: Vinnie Langton M.D.   On: 04/04/2019 10:33    Procedures .Critical Care Performed by: Fransico Meadow, PA-C Authorized by: Fransico Meadow, PA-C   Critical care provider statement:    Critical care time (minutes):  45   Critical care start time:  04/04/2019 11:30 AM   Critical care end time:  04/04/2019 1:34 PM   Critical care time was exclusive of:  Separately billable procedures and treating other patients   Critical care was necessary to treat or prevent imminent or life-threatening deterioration of the following conditions:  Respiratory failure and sepsis   Critical care was time spent personally by me on the following activities:  Blood draw for specimens, development of treatment plan with patient or surrogate, discussions with consultants, ordering and performing treatments and interventions, pulse oximetry, re-evaluation of patient's condition, review of old charts, examination of patient and discussions with primary provider   I assumed direction of critical care for this patient from another provider in my specialty: no     (including critical care time)  Medications Ordered in ED Medications  vancomycin (VANCOREADY)  IVPB 1500 mg/300 mL (1,500 mg Intravenous New Bag/Given 04/04/19 1103)  aztreonam (AZACTAM) 2 g in sodium chloride 0.9 % 100 mL IVPB (0 g Intravenous Stopped 04/04/19 1103)  sodium chloride 0.9 % bolus 1,000 mL (1,000 mLs Intravenous New Bag/Given 04/04/19 1101)    ED Course  I have reviewed the triage vital signs and the nursing notes.  Pertinent labs & imaging results that were available during my care of the patient were reviewed by me and considered in my medical decision making (see chart for details).    MDM Rules/Calculators/A&P                      MDM   Sepsis protocol ordered.  Chest xray does not show pneumonia.  Pt has harsh cough and requiring oxygen.  Iv antibiotics given.   Rn reports pt had difficulty swallowing tylenol.  Pt may have aspirated  Final Clinical Impression(s) / ED Diagnoses Final diagnoses:  Sepsis, due to unspecified organism, unspecified whether acute organ dysfunction present [facility])    Rx / [place] Orders ED Discharge Orders    None       Fransico Meadow, PA-C 04/04/19 1335    Sidney Ace 04/04/19 1406    Milton Ferguson, MD 04/05/19 [phone]

## 2019-04-04 NOTE — ED Notes (Signed)
When giving pt tylenol po pt had to swallow 5-6 times with each tablet in order to swallow the tablet, pt states that sometimes he does have problems with "things getting choked"

## 2019-04-05 LAB — COMPREHENSIVE METABOLIC PANEL
ALT: 12 U/L (ref 0–44)
AST: 19 U/L (ref 15–41)
Albumin: 2.9 g/dL — ABNORMAL LOW (ref 3.5–5.0)
Alkaline Phosphatase: 57 U/L (ref 38–126)
Anion gap: 8 (ref 5–15)
BUN: 26 mg/dL — ABNORMAL HIGH (ref 8–23)
CO2: 25 mmol/L (ref 22–32)
Calcium: 8.1 mg/dL — ABNORMAL LOW (ref 8.9–10.3)
Chloride: 104 mmol/L (ref 98–111)
Creatinine, Ser: 1.31 mg/dL — ABNORMAL HIGH (ref 0.61–1.24)
GFR calc Af Amer: >60 mL/min (ref >60)
GFR calc non Af Amer: 55 mL/min — ABNORMAL LOW (ref >60)
Glucose, Bld: 102 mg/dL — ABNORMAL HIGH (ref 70–99)
Potassium: 4.2 mmol/L (ref 3.5–5.1)
Sodium: 137 mmol/L (ref 135–145)
Total Bilirubin: 1.2 mg/dL (ref 0.3–1.2)
Total Protein: 6.6 g/dL (ref 6.5–8.1)

## 2019-04-05 LAB — CBC
HCT: 41 % (ref 39.0–52.0)
Hemoglobin: 13 g/dL (ref 13.0–17.0)
MCH: 31.8 pg (ref 26.0–34.0)
MCHC: 31.7 g/dL (ref 30.0–36.0)
MCV: 100.2 fL — ABNORMAL HIGH (ref 80.0–100.0)
Platelets: 265 10*3/uL (ref 150–400)
RBC: 4.09 MIL/uL — ABNORMAL LOW (ref 4.22–5.81)
RDW: 15 % (ref 11.5–15.5)
WBC: 8.5 10*3/uL (ref 4.0–10.5)
nRBC: 0 % (ref 0.0–0.2)

## 2019-04-05 LAB — GLUCOSE, CAPILLARY: Glucose-Capillary: 84 mg/dL (ref 70–99)

## 2019-04-05 LAB — HIV ANTIBODY (ROUTINE TESTING W REFLEX): HIV Screen 4th Generation wRfx: NONREACTIVE — AB

## 2019-04-05 LAB — STREP PNEUMONIAE URINARY ANTIGEN: Strep Pneumo Urinary Antigen: NEGATIVE

## 2019-04-05 MED ORDER — ACETAMINOPHEN 325 MG PO TABS
650.0000 mg | ORAL_TABLET | Freq: Four times a day (QID) | ORAL | Status: DC | PRN
  Administered 2019-04-05: 650 mg via ORAL
  Filled 2019-04-05: qty 2

## 2019-04-05 MED ORDER — ACETAMINOPHEN 10 MG/ML IV SOLN
INTRAVENOUS | Status: AC
  Filled 2019-04-05: qty 100

## 2019-04-05 MED ORDER — LEVOTHYROXINE SODIUM 88 MCG PO TABS
88.0000 ug | ORAL_TABLET | Freq: Every day | ORAL | Status: DC
  Administered 2019-04-06 – 2019-04-07 (×2): 88 ug via ORAL
  Filled 2019-04-05 (×2): qty 1

## 2019-04-05 MED ORDER — PAROXETINE HCL 20 MG PO TABS
20.0000 mg | ORAL_TABLET | Freq: Every day | ORAL | Status: DC
  Administered 2019-04-05 – 2019-04-07 (×3): 20 mg via ORAL
  Filled 2019-04-05 (×3): qty 1

## 2019-04-05 MED ORDER — QUETIAPINE FUMARATE 100 MG PO TABS
400.0000 mg | ORAL_TABLET | Freq: Every day | ORAL | Status: DC
  Administered 2019-04-05 – 2019-04-06 (×2): 400 mg via ORAL
  Filled 2019-04-05 (×2): qty 4

## 2019-04-05 MED ORDER — ATORVASTATIN CALCIUM 20 MG PO TABS
20.0000 mg | ORAL_TABLET | Freq: Every day | ORAL | Status: DC
  Administered 2019-04-05 – 2019-04-07 (×3): 20 mg via ORAL
  Filled 2019-04-05 (×3): qty 1

## 2019-04-05 MED ORDER — DRONABINOL 2.5 MG PO CAPS
2.5000 mg | ORAL_CAPSULE | Freq: Two times a day (BID) | ORAL | Status: DC
  Administered 2019-04-05 – 2019-04-07 (×4): 2.5 mg via ORAL
  Filled 2019-04-05 (×4): qty 1

## 2019-04-05 MED ORDER — QUETIAPINE FUMARATE 25 MG PO TABS
50.0000 mg | ORAL_TABLET | Freq: Every day | ORAL | Status: DC
  Administered 2019-04-05 – 2019-04-06 (×2): 50 mg via ORAL
  Filled 2019-04-05 (×2): qty 2

## 2019-04-05 MED ORDER — TAMSULOSIN HCL 0.4 MG PO CAPS
0.4000 mg | ORAL_CAPSULE | Freq: Every day | ORAL | Status: DC
  Administered 2019-04-05 – 2019-04-06 (×2): 0.4 mg via ORAL
  Filled 2019-04-05 (×2): qty 1

## 2019-04-05 MED ORDER — GUAIFENESIN 100 MG/5ML PO SOLN
200.0000 mg | Freq: Four times a day (QID) | ORAL | Status: DC | PRN
  Filled 2019-04-05: qty 10

## 2019-04-05 MED ORDER — DIVALPROEX SODIUM 250 MG PO DR TAB
250.0000 mg | DELAYED_RELEASE_TABLET | Freq: Every day | ORAL | Status: DC
  Administered 2019-04-05 – 2019-04-06 (×2): 250 mg via ORAL
  Filled 2019-04-05 (×2): qty 1

## 2019-04-05 MED ORDER — ACETAMINOPHEN 650 MG RE SUPP: 650.0000 mg | Freq: Four times a day (QID) | RECTAL | Status: DC | PRN

## 2019-04-05 NOTE — Progress Notes (Addendum)
PROGRESS NOTE    Ricky Ward  [id number] DOB: 1950/03/02 DOA: 04/04/2019 PCP: Rosita Fire, MD    Brief Narrative:  69 year old male who is a resident of an assisted living facility, is brought to the hospital with fever, cough, shortness of breath.  He has acute respiratory failure with hypoxia.  In the ER, he was noted to have difficulty swallowing.  Suspect that he has aspiration pneumonia.  He has been started on intravenous antibiotics.  He was seen by speech therapy.  Continues to have significant cough, but overall shortness of breath and respiratory failure is improving.   Assessment & Plan:   Active Problems:   Bipolar disorder in full remission ([place])   Aspiration pneumonia ([place])   Acute respiratory failure with hypoxia ([facility])   Dysphagia   Hypothyroidism   HLD (hyperlipidemia)   1. Acute respiratory failure with hypoxia secondary to pneumonia, suspect aspiration.  Started on broad-spectrum antibiotics.    Since MRSA PCR is negative, vancomycin has been discontinued.  Continue levofloxacin.  Overall fevers have resolved.  Blood cultures are showing no growth.  Sputum culture in process.  Strep pneumo antigen is negative.  Legionella antigen in process.  Continue bronchodilators.  Continue to wean off oxygen as tolerated. 2. Dysphagia.    Seen by speech therapy with recommendations for dysphagia 2 diet. 3. Hypothyroidism.  Resume Synthroid once able to take p.o. 4. Hyperlipidemia.  continue  5. Bipolar disorder.  continue on home dose of psychotropic medications 6. Urinary retention.  Patient noted to have over 400 mL on bladder scan and was unable to void.  Foley catheter was placed.  Started on Flomax.  Can likely have voiding trial prior to discharge.   DVT prophylaxis: Lovenox Code Status: Full code Family Communication: None present Disposition Plan: Return to [facility] once respiratory status has stabilized.  Will request physical therapy evaluation to help  determine disposition.  Anticipate he should be ready in the next 24 to 48 hours to discharge, provided he remains afebrile and respiratory status remained stable.   Consultants:     Procedures:     Antimicrobials:   Levofloxacin 2/20 >  Vancomycin 2/20 > 2/21   Subjective: Continues to have productive cough.  Overall shortness of breath improving.  Fevers have resolved.  Objective: Vitals:   04/05/19 1144 04/05/19 1200 04/05/19 1300 04/05/19 1403  BP:  114/69 110/64   Pulse:  93 88   Resp:  (!) 21 15   Temp: 98.5 F (36.9 C) 98.5 F (36.9 C)    TempSrc: Oral Oral    SpO2:  98% 97% 96%  Weight:      Height:        Intake/Output Summary (Last 24 hours) at 04/05/2019 1623 Last data filed at 04/05/2019 1100 Gross per 24 hour  Intake 1821.83 ml  Output 550 ml  Net 1271.83 ml   Filed Weights   04/04/19 0931 04/04/19 1628 04/05/19 0430  Weight: 77.6 kg 75.7 kg 76.4 kg    Examination:  General exam: Appears calm and comfortable  Respiratory system: Bilateral rhonchi. Respiratory effort normal. Cardiovascular system: S1 & S2 heard, RRR. No JVD, murmurs, rubs, gallops or clicks. No pedal edema. Gastrointestinal system: Abdomen is nondistended, soft and nontender. No organomegaly or masses felt. Normal bowel sounds heard. Central nervous system: Alert and oriented. No focal neurological deficits. Extremities: Symmetric 5 x 5 power. Skin: No rashes, lesions or ulcers Psychiatry: Judgement and insight appear normal. Mood & affect appropriate.  Data Reviewed: I have personally reviewed following labs and imaging studies  CBC: Recent Labs  Lab 04/04/19 0950 04/05/19 0514  WBC 13.7* 8.5  NEUTROABS 11.4*  --   HGB 14.6 13.0  HCT 44.3 41.0  MCV 97.8 100.2*  PLT 250 [id number]   Basic Metabolic Panel: Recent Labs  Lab 04/04/19 0950 04/05/19 0514  NA 137 137  K 4.1 4.2  CL 102 104  CO2 23 25  GLUCOSE 148* 102*  BUN 19 26*  CREATININE 1.34* 1.31*  CALCIUM  8.5* 8.1*   GFR: Estimated Creatinine Clearance: 49.1 mL/min (A) (by C-G formula based on SCr of 1.31 mg/dL (H)). Liver Function Tests: Recent Labs  Lab 04/04/19 0950 04/05/19 0514  AST 21 19  ALT 15 12  ALKPHOS 81 57  BILITOT 1.2 1.2  PROT 7.8 6.6  ALBUMIN 3.7 2.9*   No results for input(s): LIPASE, AMYLASE in the last 168 hours. No results for input(s): AMMONIA in the last 168 hours. Coagulation Profile: Recent Labs  Lab 04/04/19 0950  INR 1.0   Cardiac Enzymes: No results for input(s): CKTOTAL, CKMB, CKMBINDEX, TROPONINI in the last 168 hours. BNP (last 3 results) No results for input(s): PROBNP in the last 8760 hours. HbA1C: No results for input(s): HGBA1C in the last 72 hours. CBG: Recent Labs  Lab 04/04/19 1628 04/05/19 1144  GLUCAP 161* 84   Lipid Profile: No results for input(s): CHOL, HDL, LDLCALC, TRIG, CHOLHDL, LDLDIRECT in the last 72 hours. Thyroid Function Tests: No results for input(s): TSH, T4TOTAL, FREET4, T3FREE, THYROIDAB in the last 72 hours. Anemia Panel: No results for input(s): VITAMINB12, FOLATE, FERRITIN, TIBC, IRON, RETICCTPCT in the last 72 hours. Sepsis Labs: Recent Labs  Lab 04/04/19 0950 04/04/19 1143  LATICACIDVEN 1.6 1.2    Recent Results (from the past 240 hour(s))  Blood Culture (routine x 2)     Status: None (Preliminary result)   Collection Time: 04/04/19  9:50 AM   Specimen: BLOOD  Result Value Ref Range Status   Specimen Description BLOOD  Final   Special Requests NONE  Final   Culture   Final    NO GROWTH 1 DAY Performed at [facility], [address], [place], [place] [address]    Report Status PENDING  Incomplete  Blood Culture (routine x 2)     Status: None (Preliminary result)   Collection Time: 04/04/19 10:11 AM   Specimen: BLOOD  Result Value Ref Range Status   Specimen Description BLOOD  Final   Special Requests NONE  Final   Culture   Final    NO GROWTH 1 DAY Performed at [facility], [address], [place], [place] [address]    Report Status PENDING  Incomplete  MRSA PCR Screening     Status: None   Collection Time: 04/04/19  2:01 PM   Specimen: Nasal Mucosa; Nasopharyngeal  Result Value Ref Range Status   MRSA by PCR NEGATIVE NEGATIVE Final    Comment:        The GeneXpert MRSA Assay (FDA approved for NASAL specimens only), is one component of a comprehensive MRSA colonization surveillance program. It is not intended to diagnose MRSA infection nor to guide or monitor treatment for MRSA infections. Performed at [facility], [address], [place], [place] [address]   Culture, respiratory     Status: None (Preliminary result)   Collection Time: 04/04/19  9:04 PM   Specimen: SPU  Result Value Ref Range Status   Specimen Description  Final    SPUTUM Performed at [facility], [address] [place], [place] [address]    Special Requests   Final    NONE Performed at [facility], [address] [place], [place] [address]    Gram Stain   Final    FEW WBC PRESENT, PREDOMINANTLY PMN MODERATE GRAM POSITIVE COCCI IN PAIRS IN CLUSTERS FEW GRAM NEGATIVE RODS FEW GRAM POSITIVE RODS Performed at [facility], [place] [address], [place], [place] [address]    Culture PENDING  Incomplete   Report Status PENDING  Incomplete         Radiology Studies: DG Chest Port 1 View  Result Date: 04/04/2019 CLINICAL DATA:  69 year old male with history of productive cough for the past 2 days. EXAM: PORTABLE CHEST 1 VIEW COMPARISON:  Chest x-ray 02/06/2019. FINDINGS: Lung volumes are normal. No consolidative airspace disease. No pleural effusions. No evidence of pulmonary edema. Heart size is normal. Large hiatal hernia. Upper mediastinal contours are within normal limits. Aortic atherosclerosis. IMPRESSION: 1. No radiographic evidence of acute cardiopulmonary disease. 2. Large hiatal hernia. 3. Aortic atherosclerosis. Electronically Signed   By: Vinnie Langton M.D.   On:  04/04/2019 10:33        Scheduled Meds: . atorvastatin  20 mg Oral Daily  . Chlorhexidine Gluconate Cloth  6 each Topical Daily  . divalproex  250 mg Oral QHS  . dronabinol  2.5 mg Oral BID AC  . enoxaparin (LOVENOX) injection  40 mg Subcutaneous Q24H  . ipratropium-albuterol  3 mL Nebulization Q6H  . [START ON 04/06/2019] levothyroxine  88 mcg Oral QAC breakfast  . mouth rinse  15 mL Mouth Rinse BID  . PARoxetine  20 mg Oral Daily  . QUEtiapine  400 mg Oral QHS  . QUEtiapine  50 mg Oral QHS  . tamsulosin  0.4 mg Oral QPC supper   Continuous Infusions: . sodium chloride 75 mL/hr at 04/05/19 1100  . levofloxacin (LEVAQUIN) IV Stopped (04/04/19 1904)     LOS: 1 day    Time spent: 8mins    Kathie Dike, MD [facility]   If 7PM-7AM, please contact night-coverage [email or web address]  04/05/2019, 4:23 PM

## 2019-04-05 NOTE — Plan of Care (Signed)
  Problem: SLP Dysphagia Goals Goal: Patient will utilize recommended strategies Description: Patient will utilize recommended strategies during swallow to increase swallowing safety with Flowsheets (Taken 04/05/2019 1407) Patient will utilize recommended strategies during swallow to increase swallowing safety with: min assist

## 2019-04-05 NOTE — Progress Notes (Signed)
Patient vitals: 135/64, 109, 18, 101.6 oral.  Mid level contacted regarding temp and heart rate.   Awaiting orders at this time. MEWS score changed to yellow from green due to above reasons.  MD aware of above issues.  Tylenol ordered for fever will continue to monitor heart rate , no meds ordered from increased HR.

## 2019-04-05 NOTE — Progress Notes (Signed)
Dr. Olevia Bowens notified of patient's output of 132ml for this shift.  Bladder scan shows 420ml.  Patient stood beside bed to attempt to void without any success.

## 2019-04-05 NOTE — Evaluation (Signed)
Clinical/Bedside Swallow Evaluation Patient Details  Name: Ricky Ward MRN: [id number] Date of Birth: [birth year]  Today's Date: 04/05/2019 Time: SLP Start Time (ACUTE ONLY): 1342 SLP Stop Time (ACUTE ONLY): 1405 SLP Time Calculation (min) (ACUTE ONLY): 23 min  Past Medical History:  Past Medical History:  Diagnosis Date  . Bipolar 1 disorder ([place])   . Constipation   . Hypercholesterolemia   . Psoriasis   . Thyroid disease    Past Surgical History:  Past Surgical History:  Procedure Laterality Date  . FOOT SURGERY  08/23/2003  . WRIST FRACTURE SURGERY Left    HPI:  Ricky Ward is a 69 y.o. male with medical history significant of bipolar disorder, hypothyroidism, hyperlipidemia, who is a resident of an assisted living facility.  Patient was diagnosed with COVID-19 in 01/2019.  He was not hospitalized at that time since he did not have any significant hypoxia.  He is brought to the hospital today with worsening cough and shortness of breath he reports onset of symptoms over the past 48 hours.  BSE ordered   Assessment / Plan / Recommendation Clinical Impression  Clinical swallowing evaluation completed while Pt was seated upright in bed; Oral mechanism exam reveals white/brown coating on posterior lingual surface (despite oral care provided, unable to remove coating) and edentulous status- Pt reports he only wears upper dentures but they are not here at the hospital. Pt with congested sounding cough at baseline. Pt consumed ice chips, thin liquids, puree and regular textures without overt s/sx of aspiration. Pt reports when he got "choked" in the ED that he was "lying flat" when attempting to swallow the tylenol presented. SLP provided education to the Pt that he should always be sitting upright for all PO and reviewed universal aspiration precuations. Secondary to Pt not having his dentures, recommend initiate D2/fine chop diet with thin liquids. Above to RN. ST will f/u X1 to ensure  diet tolerance.  SLP Visit Diagnosis: Dysphagia, unspecified (R13.10)    Aspiration Risk  Mild aspiration risk    Diet Recommendation     Liquid Administration via: Cup;Straw Medication Administration: Whole meds with liquid Supervision: Patient able to self feed Compensations: Minimize environmental distractions;Slow rate;Small sips/bites;Follow solids with liquid Postural Changes: Seated upright at 90 degrees;Remain upright for at least 30 minutes after po intake    Other  Recommendations Oral Care Recommendations: Oral care BID   Follow up Recommendations None      Frequency and Duration min 1 x/week  1 week       Prognosis Prognosis for Safe Diet Advancement: Guarded      Swallow Study   General Date of Onset: 04/04/19 HPI: Ricky Ward is a 69 y.o. male with medical history significant of bipolar disorder, hypothyroidism, hyperlipidemia, who is a resident of an assisted living facility.  Patient was diagnosed with COVID-19 in 01/2019.  He was not hospitalized at that time since he did not have any significant hypoxia.  He is brought to the hospital today with worsening cough and shortness of breath he reports onset of symptoms over the past 48 hours.  BSE ordered Type of Study: Bedside Swallow Evaluation Previous Swallow Assessment: none in chart Diet Prior to this Study: NPO Temperature Spikes Noted: No Respiratory Status: Nasal cannula History of Recent Intubation: No Behavior/Cognition: Alert;Cooperative;Pleasant mood Oral Cavity Assessment: Dry Oral Care Completed by SLP: Yes Oral Cavity - Dentition: Dentures, top;Edentulous(only wears upper dentures but they are not present) Vision: Functional for self-feeding Self-Feeding Abilities:  Able to feed self Patient Positioning: Upright in bed Baseline Vocal Quality: Normal Volitional Cough: Strong;Congested Volitional Swallow: Able to elicit    Oral/Motor/Sensory Function Overall Oral Motor/Sensory Function:  Within functional limits   Ice Chips Ice chips: Within functional limits   Thin Liquid Thin Liquid: Within functional limits    Nectar Thick Nectar Thick Liquid: Not tested   Honey Thick Honey Thick Liquid: Not tested   Puree Puree: Within functional limits   Solid     Solid: Within functional limits     Ricky Ward H. Roddie Mc, CCC-SLP Speech Language Pathologist  [name] 04/05/2019,2:06 PM

## 2019-04-06 DIAGNOSIS — E785 Hyperlipidemia, unspecified: Secondary | ICD-10-CM

## 2019-04-06 LAB — URINE CULTURE: Culture: NO GROWTH

## 2019-04-06 LAB — CBC
HCT: 38.6 % — ABNORMAL LOW (ref 39.0–52.0)
Hemoglobin: 12.6 g/dL — ABNORMAL LOW (ref 13.0–17.0)
MCH: 32 pg (ref 26.0–34.0)
MCHC: 32.6 g/dL (ref 30.0–36.0)
MCV: 98 fL (ref 80.0–100.0)
Platelets: 268 10*3/uL (ref 150–400)
RBC: 3.94 MIL/uL — ABNORMAL LOW (ref 4.22–5.81)
RDW: 14.6 % (ref 11.5–15.5)
WBC: 9.7 10*3/uL (ref 4.0–10.5)
nRBC: 0 % (ref 0.0–0.2)

## 2019-04-06 LAB — BASIC METABOLIC PANEL
Anion gap: 10 (ref 5–15)
BUN: 18 mg/dL (ref 8–23)
CO2: 23 mmol/L (ref 22–32)
Calcium: 8.2 mg/dL — ABNORMAL LOW (ref 8.9–10.3)
Chloride: 104 mmol/L (ref 98–111)
Creatinine, Ser: 1.03 mg/dL (ref 0.61–1.24)
GFR calc Af Amer: >60 mL/min (ref >60)
GFR calc non Af Amer: >60 mL/min (ref >60)
Glucose, Bld: 144 mg/dL — ABNORMAL HIGH (ref 70–99)
Potassium: 3.7 mmol/L (ref 3.5–5.1)
Sodium: 137 mmol/L (ref 135–145)

## 2019-04-06 MED ORDER — ACETAMINOPHEN 650 MG RE SUPP: 650.0000 mg | Freq: Four times a day (QID) | RECTAL | Status: DC

## 2019-04-06 MED ORDER — LEVOFLOXACIN IN D5W 750 MG/150ML IV SOLN
750.0000 mg | INTRAVENOUS | Status: DC
  Administered 2019-04-06 – 2019-04-07 (×2): 750 mg via INTRAVENOUS
  Filled 2019-04-06 (×2): qty 150

## 2019-04-06 MED ORDER — ACETAMINOPHEN 325 MG PO TABS
650.0000 mg | ORAL_TABLET | Freq: Four times a day (QID) | ORAL | Status: DC
  Administered 2019-04-06 – 2019-04-07 (×4): 650 mg via ORAL
  Filled 2019-04-06 (×4): qty 2

## 2019-04-06 NOTE — Evaluation (Signed)
Physical Therapy Evaluation Patient Details Name: NYMIR KRUCHTEN MRN: [id number] DOB: Nov 28, 1950 Today's Date: 04/06/2019   History of Present Illness  ZADE NORDHOFF is a 69 y.o. male with medical history significant of bipolar disorder, hypothyroidism, hyperlipidemia, who is a resident of an assisted living facility.  Patient was diagnosed with COVID-19 in 01/2019.  He was not hospitalized at that time since he did not have any significant hypoxia.  He is brought to the hospital today with worsening cough and shortness of breath he reports onset of symptoms over the past 48 hours.  He has been congested, coughing, he is noted to be febrile.  He is not had any chest pain.  He is not had any nausea or vomiting..    Clinical Impression  Patient requires repeated verbal cues to follow instructions, unable to sit to stand without AD due to BLE weakness, unsteady on feet with frequent drifting to the left and bumping into nearby objects requiring verbal/tactile cueing to avoid loss of balance.  Patient tolerated sitting up in chair after therapy - RN/NT aware.  Patient will benefit from continued physical therapy in hospital and recommended venue below to increase strength, balance, endurance for safe ADLs and gait.     Follow Up Recommendations SNF;Supervision for mobility/OOB;Supervision - Intermittent    Equipment Recommendations  Rolling walker with 5" wheels    Recommendations for Other Services       Precautions / Restrictions Precautions Precautions: Fall Restrictions Weight Bearing Restrictions: No      Mobility  Bed Mobility Overal bed mobility: Needs Assistance Bed Mobility: Supine to Sit     Supine to sit: Min guard     General bed mobility comments: increased time, slow labored movement  Transfers Overall transfer level: Needs assistance Equipment used: Rolling walker (2 wheeled) Transfers: Sit to/from [facility] Sit to Stand: Min assist;Mod  assist Stand pivot transfers: Min assist;Mod assist       General transfer comment: unable to stand without AD due to BLE weakness, required use of RW  Ambulation/Gait Ambulation/Gait assistance: Mod assist Gait Distance (Feet): 25 Feet Assistive device: Rolling walker (2 wheeled) Gait Pattern/deviations: Decreased step length - right;Decreased step length - left;Decreased stride length;Drifts right/left;Ataxic Gait velocity: decreased   General Gait Details: slow labored ataxic like cadence with frequent drifting to the left and bumping into nearby objects, limited secondary to fatigue  Stairs            Wheelchair Mobility    Modified Rankin (Stroke Patients Only)       Balance Overall balance assessment: Needs assistance Sitting-balance support: Feet supported;No upper extremity supported Sitting balance-Leahy Scale: Fair Sitting balance - Comments: fair/good seated at bedside   Standing balance support: During functional activity;No upper extremity supported Standing balance-Leahy Scale: Poor Standing balance comment: fair/poor using RW                             Pertinent Vitals/Pain Pain Assessment: 0-10 Pain Score: 3  Pain Location: bilateral knees, right worse than left Pain Descriptors / Indicators: Aching;Discomfort Pain Intervention(s): Limited activity within patient's tolerance;Monitored during session    Home Living Family/patient expects to be discharged to:: Assisted living               Home Equipment: None Additional Comments: Houshold ambulator without AD, "per patient"    Prior Function Level of Independence: Needs assistance   Gait / Transfers Assistance Needed:  household amblator without AD  ADL's / Homemaking Assistance Needed: assisted by ALF staff        Hand Dominance        Extremity/Trunk Assessment   Upper Extremity Assessment Upper Extremity Assessment: Generalized weakness    Lower Extremity  Assessment Lower Extremity Assessment: Generalized weakness    Cervical / Trunk Assessment Cervical / Trunk Assessment: Normal  Communication   Communication: No difficulties  Cognition Arousal/Alertness: Awake/alert Behavior During Therapy: WFL for tasks assessed/performed Overall Cognitive Status: No family/caregiver present to determine baseline cognitive functioning                                        General Comments      Exercises     Assessment/Plan    PT Assessment Patient needs continued PT services  PT Problem List Decreased strength;Decreased activity tolerance;Decreased balance;Decreased mobility       PT Treatment Interventions Balance training;Gait training;Functional mobility training;Therapeutic activities;Therapeutic exercise;Patient/family education    PT Goals (Current goals can be found in the Care Plan section)  Acute Rehab PT Goals Patient Stated Goal: return home with ALF staff to assist PT Goal Formulation: With patient Time For Goal Achievement: 04/20/19 Potential to Achieve Goals: Good    Frequency Min 3X/week   Barriers to discharge        Co-evaluation               AM-PAC PT "6 Clicks" Mobility  Outcome Measure Help needed turning from your back to your side while in a flat bed without using bedrails?: None Help needed moving from lying on your back to sitting on the side of a flat bed without using bedrails?: A Little Help needed moving to and from a bed to a chair (including a wheelchair)?: A Little Help needed standing up from a chair using your arms (e.g., wheelchair or bedside chair)?: A Lot Help needed to walk in hospital room?: A Lot Help needed climbing 3-5 steps with a railing? : A Lot 6 Click Score: 16    End of Session Equipment Utilized During Treatment: Gait belt Activity Tolerance: Patient tolerated treatment well;Patient limited by fatigue Patient left: in chair;with call bell/phone within  reach;with chair alarm set Nurse Communication: Mobility status PT Visit Diagnosis: Unsteadiness on feet (R26.81);Other abnormalities of gait and mobility (R26.89);Muscle weakness (generalized) (M62.81)    Time: [id number] PT Time Calculation (min) (ACUTE ONLY): 27 min   Charges:   PT Evaluation $PT Eval Moderate Complexity: 1 Mod PT Treatments $Therapeutic Activity: 23-37 mins        10:01 AM, 04/06/19 Lonell Grandchild, MPT Physical Therapist with [facility] [phone] office [phone] mobile phone

## 2019-04-06 NOTE — Plan of Care (Signed)
  Problem: Acute Rehab PT Goals(only PT should resolve) Goal: Pt Will Go Supine/Side To Sit Outcome: Progressing Flowsheets (Taken 04/06/2019 1002) Pt will go Supine/Side to Sit: with modified independence Goal: Patient Will Transfer Sit To/From Stand Outcome: Progressing Flowsheets (Taken 04/06/2019 1002) Patient will transfer sit to/from stand: with min guard assist Goal: Pt Will Transfer Bed To Chair/Chair To Bed Outcome: Progressing Flowsheets (Taken 04/06/2019 1002) Pt will Transfer Bed to Chair/Chair to Bed: min guard assist Goal: Pt Will Ambulate Outcome: Progressing Flowsheets (Taken 04/06/2019 1002) Pt will Ambulate:  50 feet  with min guard assist  with minimal assist  with rolling walker   10:03 AM, 04/06/19 Ricky Ward, MPT Physical Therapist with [facility] [phone] office [phone] mobile phone

## 2019-04-06 NOTE — Progress Notes (Signed)
Foley removed at 1715 and condom cath placed  Contnues to have periods of confusion

## 2019-04-06 NOTE — Progress Notes (Signed)
  Speech Language Pathology Treatment: Dysphagia  Patient Details Name: Ricky Ward MRN: [id number] DOB: Jul 28, 1950 Today's Date: 04/06/2019 Time: [id number] SLP Time Calculation (min) (ACUTE ONLY): 12 min  Assessment / Plan / Recommendation Clinical Impression  Pt seen for ongoing dysphagia intervention following BSE completed yesterday. He was placed on D2 and thin liquids and is reportedly consuming without incident (per Pt and nursing staff). Pt observed with thin liquids via straw sips and small amount of soft solids. Pt without overt signs or symptoms of reduced airway protection. He does not have his dentures here and D2 diet continues to be appropriate. Pt reminded to sit fully upright for all eating and drinking. Continue diet as ordered.    HPI HPI: Ricky Ward is a 69 y.o. male with medical history significant of bipolar disorder, hypothyroidism, hyperlipidemia, who is a resident of an assisted living facility.  Patient was diagnosed with COVID-19 in 01/2019.  He was not hospitalized at that time since he did not have any significant hypoxia.  He is brought to the hospital today with worsening cough and shortness of breath he reports onset of symptoms over the past 48 hours.  BSE ordered      SLP Plan  Continue with current plan of care       Recommendations  Diet recommendations: Dysphagia 2 (fine chop);Thin liquid Liquids provided via: Cup;Straw Medication Administration: Whole meds with liquid Supervision: Patient able to self feed;Intermittent supervision to cue for compensatory strategies Compensations: Minimize environmental distractions;Slow rate;Small sips/bites;Follow solids with liquid Postural Changes and/or Swallow Maneuvers: Upright 30-60 min after meal;Seated upright 90 degrees                Oral Care Recommendations: Oral care BID Follow up Recommendations: None SLP Visit Diagnosis: Dysphagia, unspecified (R13.10) Plan: Continue with current plan of  care       Thank you,  Genene Churn, [place]                 [place] 04/06/2019, 2:49 PM

## 2019-04-06 NOTE — TOC Initial Note (Addendum)
Transition of Care [facility]) - Initial/Assessment Note    Patient Details  Name: Ricky Ward MRN: [id number] Date of Birth: 1950-10-12  Transition of Care [facility]) CM/SW Contact:    Edell Mesenbrink, Chauncey Reading, RN Phone Number: 04/06/2019, 10:04 AM  Clinical Narrative:          Patient from [facility]. Patient is cleared by Tammy of [facility] to return at time of DC with home health PT (Tammy and Wells Guiles CM discussed on 2/22). Rolling walkers are available at facility for patient to use. Will need home health orders for PT.       Activities of Daily Living Home Assistive Devices/Equipment: None ADL Screening (condition at time of admission) Patient's cognitive ability adequate to safely complete daily activities?: Yes Is the patient deaf or have difficulty hearing?: Yes Does the patient have difficulty seeing, even when wearing glasses/contacts?: No Does the patient have difficulty concentrating, remembering, or making decisions?: Yes Patient able to express need for assistance with ADLs?: Yes Does the patient have difficulty dressing or bathing?: No Independently performs ADLs?: Yes (appropriate for developmental age) Does the patient have difficulty walking or climbing stairs?: No Weakness of Legs: None Weakness of Arms/Hands: None       Admission diagnosis:  Aspiration pneumonia ([place]) [J69.0] Sepsis, due to unspecified organism, unspecified whether acute organ dysfunction present [facility]) [A41.9] Patient Active Problem List   Diagnosis Date Noted  . Aspiration pneumonia ([place]) 04/04/2019  . Acute respiratory failure with hypoxia ([place]) 04/04/2019  . Dysphagia 04/04/2019  . Hypothyroidism 04/04/2019  . HLD (hyperlipidemia) 04/04/2019  . Heartburn 03/12/2019  . Preventative health care 04/28/2018  . Bipolar disorder in full remission ([place]) 10/09/2017  . Alcohol use disorder, mild, in sustained remission 10/09/2017   PCP:  Rosita Fire, MD Pharmacy:   [facility], [place] [address] Phone: [phone] Fax: [phone]     Social Determinants of Health (SDOH) Interventions    Readmission Risk Interventions No flowsheet data found.

## 2019-04-06 NOTE — Progress Notes (Signed)
PROGRESS NOTE    Ricky Ward  [id number] DOB: 06/26/1950 DOA: 04/04/2019 PCP: Rosita Fire, MD    Brief Narrative:  69 year old male who is a resident of an assisted living facility, is brought to the hospital with fever, cough, shortness of breath.  He has acute respiratory failure with hypoxia.  In the ER, he was noted to have difficulty swallowing.  Suspect that he has aspiration pneumonia.  He has been started on intravenous antibiotics.  He was seen by speech therapy.  Continues to have significant cough, but overall shortness of breath and respiratory failure is improving.   Assessment & Plan:   Active Problems:   Bipolar disorder in full remission ([place])   Aspiration pneumonia ([place])   Acute respiratory failure with hypoxia ([facility])   Dysphagia   Hypothyroidism   HLD (hyperlipidemia)   1. Acute respiratory failure with hypoxia secondary to pneumonia, suspect aspiration.  Started on broad-spectrum antibiotics.    Since MRSA PCR is negative, vancomycin has been discontinued.  Continue levofloxacin.  He had fever overnight. Will monitor for additional night to be sure fevers have resolved.  Blood cultures are showing no growth.  Sputum culture in process.  Strep pneumo antigen is negative.   Continue bronchodilators.  Continue to wean off oxygen as tolerated. 2. Dysphagia.    Seen by speech therapy with recommendations for dysphagia 2 diet. 3. Hypothyroidism.  levothyroxine resumed.  4. Hyperlipidemia.  continue atorvastatin.  5. Bipolar disorder.  continue on home dose of psychotropic medications 6. Urinary retention.  Patient noted to have over 400 mL on bladder scan and was unable to void.  Foley catheter was placed.  Started on Flomax.  Remove foley and do voiding trial. If cannot urinate will need to discharge with foley in place.    DVT prophylaxis: Lovenox Code Status: Full code Family Communication: None present Disposition Plan: Return to [facility] once  respiratory status has stabilized and afebrile x 24 hours, had fever overnight.  Will request physical therapy evaluation to help determine disposition.  Anticipate he should be ready in the next 24 to 48 hours to discharge, provided he remains afebrile and respiratory status remained stable.   Consultants:     Procedures:     Antimicrobials:   Levofloxacin 2/20 >  Vancomycin 2/20 > 2/21   Subjective: Pt had fever overnight and has been coughing but overall stable.   Objective: Vitals:   04/06/19 0522 04/06/19 0842 04/06/19 1401 04/06/19 1500  BP: 134/71  (!) 107/54   Pulse: (!) 109  (!) 106   Resp: 18  16   Temp: 98.1 F (36.7 C)  98 F (36.7 C)   TempSrc: Oral  Oral   SpO2: 93% 97% 95% 94%  Weight:      Height:        Intake/Output Summary (Last 24 hours) at 04/06/2019 1657 Last data filed at 04/06/2019 1503 Gross per 24 hour  Intake 2190.35 ml  Output 300 ml  Net 1890.35 ml   Filed Weights   04/04/19 0931 04/04/19 1628 04/05/19 0430  Weight: 77.6 kg 75.7 kg 76.4 kg    Examination:  General exam: Appears calm and comfortable  Respiratory system: Bilateral rhonchi. Respiratory effort normal. Cardiovascular system: S1 & S2 heard, RRR. No JVD, murmurs, rubs, gallops or clicks. No pedal edema. Gastrointestinal system: Abdomen is nondistended, soft and nontender. No organomegaly or masses felt. Normal bowel sounds heard. Central nervous system: Alert and oriented. No focal neurological deficits. Extremities:  Symmetric 5 x 5 power. Skin: No rashes, lesions or ulcers Psychiatry: Judgement and insight appear normal. Mood & affect appropriate.   Data Reviewed: I have personally reviewed following labs and imaging studies  CBC: Recent Labs  Lab 04/04/19 0950 04/05/19 0514 04/06/19 0449  WBC 13.7* 8.5 9.7  NEUTROABS 11.4*  --   --   HGB 14.6 13.0 12.6*  HCT 44.3 41.0 38.6*  MCV 97.8 100.2* 98.0  PLT 250 265 [id number]   Basic Metabolic Panel: Recent Labs  Lab  04/04/19 0950 04/05/19 0514 04/06/19 0449  NA 137 137 137  K 4.1 4.2 3.7  CL 102 104 104  CO2 23 25 23   GLUCOSE 148* 102* 144*  BUN 19 26* 18  CREATININE 1.34* 1.31* 1.03  CALCIUM 8.5* 8.1* 8.2*   GFR: Estimated Creatinine Clearance: 62.4 mL/min (by C-G formula based on SCr of 1.03 mg/dL). Liver Function Tests: Recent Labs  Lab 04/04/19 0950 04/05/19 0514  AST 21 19  ALT 15 12  ALKPHOS 81 57  BILITOT 1.2 1.2  PROT 7.8 6.6  ALBUMIN 3.7 2.9*   No results for input(s): LIPASE, AMYLASE in the last 168 hours. No results for input(s): AMMONIA in the last 168 hours. Coagulation Profile: Recent Labs  Lab 04/04/19 0950  INR 1.0   Cardiac Enzymes: No results for input(s): CKTOTAL, CKMB, CKMBINDEX, TROPONINI in the last 168 hours. BNP (last 3 results) No results for input(s): PROBNP in the last 8760 hours. HbA1C: No results for input(s): HGBA1C in the last 72 hours. CBG: Recent Labs  Lab 04/04/19 1628 04/05/19 1144  GLUCAP 161* 84   Lipid Profile: No results for input(s): CHOL, HDL, LDLCALC, TRIG, CHOLHDL, LDLDIRECT in the last 72 hours. Thyroid Function Tests: No results for input(s): TSH, T4TOTAL, FREET4, T3FREE, THYROIDAB in the last 72 hours. Anemia Panel: No results for input(s): VITAMINB12, FOLATE, FERRITIN, TIBC, IRON, RETICCTPCT in the last 72 hours. Sepsis Labs: Recent Labs  Lab 04/04/19 0950 04/04/19 1143  LATICACIDVEN 1.6 1.2    Recent Results (from the past 240 hour(s))  Blood Culture (routine x 2)     Status: None (Preliminary result)   Collection Time: 04/04/19  9:50 AM   Specimen: BLOOD  Result Value Ref Range Status   Specimen Description BLOOD  Final   Special Requests NONE  Final   Culture   Final    NO GROWTH 2 DAYS Performed at [facility], [address] [place], [place] [address]    Report Status PENDING  Incomplete  Blood Culture (routine x 2)     Status: None (Preliminary result)   Collection Time: 04/04/19 10:11 AM   Specimen:  BLOOD  Result Value Ref Range Status   Specimen Description BLOOD  Final   Special Requests NONE  Final   Culture   Final    NO GROWTH 2 DAYS Performed at [facility], [address] [place], [place] [address]    Report Status PENDING  Incomplete  Urine culture     Status: None   Collection Time: 04/04/19  1:03 PM   Specimen: In/Out Cath Urine  Result Value Ref Range Status   Specimen Description   Final    IN/OUT CATH URINE Performed at [facility], [address], [place], [place] [address]    Special Requests   Final    NONE Performed at [facility], [address], [place], [place] [address]    Culture   Final    NO GROWTH Performed at  [facility], [place] [address], [place], [place] [address]    Report Status 04/06/2019 FINAL  Final  MRSA PCR Screening     Status: None   Collection Time: 04/04/19  2:01 PM   Specimen: Nasal Mucosa; Nasopharyngeal  Result Value Ref Range Status   MRSA by PCR NEGATIVE NEGATIVE Final    Comment:        The GeneXpert MRSA Assay (FDA approved for NASAL specimens only), is one component of a comprehensive MRSA colonization surveillance program. It is not intended to diagnose MRSA infection nor to guide or monitor treatment for MRSA infections. Performed at [facility], [address] [place], [place] [address]   Culture, respiratory     Status: None (Preliminary result)   Collection Time: 04/04/19  9:04 PM   Specimen: SPU  Result Value Ref Range Status   Specimen Description   Final    SPUTUM Performed at [facility], [address], [place], [place] [address]    Special Requests   Final    NONE Performed at [facility], [address] [place], [place] [address]    Gram Stain   Final    FEW WBC PRESENT, PREDOMINANTLY PMN MODERATE GRAM POSITIVE COCCI IN PAIRS IN CLUSTERS FEW GRAM NEGATIVE RODS FEW GRAM POSITIVE RODS    Culture   Final    CULTURE REINCUBATED FOR BETTER GROWTH Performed at [facility], [place] [address], [place], [place] [address]    Report Status PENDING  Incomplete    Radiology Studies: No results found.  Scheduled Meds: . acetaminophen  650 mg Oral Q6H   Or  . acetaminophen  650 mg Rectal Q6H  . atorvastatin  20 mg Oral Daily  . Chlorhexidine Gluconate Cloth  6 each Topical Daily  . divalproex  250 mg Oral QHS  . dronabinol  2.5 mg Oral BID AC  . enoxaparin (LOVENOX) injection  40 mg Subcutaneous Q24H  . ipratropium-albuterol  3 mL Nebulization Q6H  . levothyroxine  88 mcg Oral QAC breakfast  . mouth rinse  15 mL Mouth Rinse BID  . PARoxetine  20 mg Oral Daily  . QUEtiapine  400 mg Oral QHS  . QUEtiapine  50 mg Oral QHS  . tamsulosin  0.4 mg Oral QPC supper   Continuous Infusions: . sodium chloride 40 mL/hr at 04/06/19 0902  . levofloxacin (LEVAQUIN) IV 750 mg (04/06/19 0910)     LOS: 2 days   Time spent: 40mins Irwin Brakeman, MD [facility] How to contact the [facility] Attending or Consulting provider [place] or covering provider during after hours [place], for this patient?  1. Check the care team in [facility] and look for a) attending/consulting TR[facility] provider listed and b) the [facility] team listed 2. Log into [email or web address] and use [place]'s universal password to access. If you do not have the password, please contact the hospital operator. 3. Locate the [facility] provider you are looking for under [facility] and page to a number that you can be directly reached. 4. If you still have difficulty reaching the provider, please page the [facility] (Director on Call) for the Hospitalists listed on amion for assistance.   If 7PM-7AM, please contact night-coverage [email or web address]  04/06/2019, 4:57 PM
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
## 2019-04-07 DIAGNOSIS — N401 Enlarged prostate with lower urinary tract symptoms: Secondary | ICD-10-CM | POA: Clinically undetermined

## 2019-04-07 DIAGNOSIS — N400 Enlarged prostate without lower urinary tract symptoms: Secondary | ICD-10-CM | POA: Diagnosis present

## 2019-04-07 DIAGNOSIS — R338 Other retention of urine: Secondary | ICD-10-CM

## 2019-04-07 DIAGNOSIS — F319 Bipolar disorder, unspecified: Secondary | ICD-10-CM | POA: Diagnosis present

## 2019-04-07 LAB — CULTURE, RESPIRATORY W GRAM STAIN: Culture: NORMAL

## 2019-04-07 LAB — LEGIONELLA PNEUMOPHILA SEROGP 1 UR AG: L. pneumophila Serogp 1 Ur Ag: NEGATIVE

## 2019-04-07 MED ORDER — LEVOFLOXACIN 750 MG PO TABS: 750.0000 mg | ORAL_TABLET | Freq: Every day | ORAL | 0 refills | Status: AC

## 2019-04-07 MED ORDER — LEVOFLOXACIN 750 MG PO TABS: 750.0000 mg | ORAL_TABLET | Freq: Every day | ORAL | 0 refills | Status: DC

## 2019-04-07 MED ORDER — TAMSULOSIN HCL 0.4 MG PO CAPS: 0.4000 mg | ORAL_CAPSULE | Freq: Every day | ORAL | Status: DC

## 2019-04-07 MED ORDER — TAMSULOSIN HCL 0.4 MG PO CAPS: 0.4000 mg | ORAL_CAPSULE | Freq: Every day | ORAL | 0 refills | Status: DC

## 2019-04-07 NOTE — Discharge Summary (Addendum)
Physician Discharge Summary  Ricky Ward [id number] DOB: December 13, 1950 DOA: 04/04/2019  PCP: Rosita Fire, MD  Admit date: 04/04/2019 Discharge date: 04/07/2019  Admitted From:  ALF Disposition:   ALF   Recommendations for Outpatient Follow-up:  1. Follow up with PCP in 2 weeks 2. Establish care with [facility] in 2-3 weeks 3. Take levofloxacin x 2 days only then course is completed.  4. Please obtain BMP/CBC in 1-2 weeks 5. Please check a TSH and free T4 in 1-2 weeks when patient feeling better  Home Health: Physical Therapy   Discharge Condition: STABLE   CODE STATUS: FULL    DIET:  Diet recommendations: Dysphagia 2 (fine chop);Thin liquid Liquids provided via: Cup;Straw Medication Administration: Whole meds with liquid Supervision: Patient able to self feed;Intermittent supervision to cue for compensatory strategiesCompensations: Minimize environmental distractions;Slow rate;Small sips/bites;Follow solids with liquid  Postural Changes and/or Swallow Maneuvers: Upright 30-60 min after meal; Seated upright 90 degrees  Brief Hospitalization Summary: Please see all hospital notes, images, labs for full details of the hospitalization. ADMISSION HPI: Ricky Ward is a 69 y.o. male with medical history significant of bipolar disorder, hypothyroidism, hyperlipidemia, who is a resident of an assisted living facility.  Patient was diagnosed with COVID-19 in 01/2019.  He was not hospitalized at that time since he did not have any significant hypoxia.  He is brought to the hospital today with worsening cough and shortness of breath he reports onset of symptoms over the past 48 hours.  He has been congested, coughing, he is noted to be febrile.  He is not had any chest pain.  He is not had any nausea or vomiting..  ED Course: He was evaluated in the emergency room return to be febrile, tachycardic.  He is hypoxic on room air and supplemental oxygen was applied.  He is up to  6 L of oxygen.  Chest x-ray did not show any clear-cut pneumonia, but he is repeatedly coughing.  He received Tylenol for fever, and staff noticed that he had significant difficulty swallowing his pills.  Brief Narrative:  69 year old male who is a resident of an assisted living facility, is brought to the hospital with fever, cough, shortness of breath.  He has acute respiratory failure with hypoxia.  In the ER, he was noted to have difficulty swallowing.  Suspect that he has aspiration pneumonia.  He has been started on intravenous antibiotics.  He was seen by speech therapy.  Continues to have significant cough, but overall shortness of breath and respiratory failure is improving.  Assessment & Plan:   Active Problems:   Bipolar disorder in full remission    Aspiration pneumonia    Acute respiratory failure with hypoxia   Dysphagia   Hypothyroidism   HLD (hyperlipidemia)  1. Acute respiratory failure with hypoxia secondary to pneumonia, suspect aspiration. Started on broad-spectrum antibiotics.   Since MRSA PCR is negative, vancomycin has been discontinued.  Continue levofloxacin.  He remained afebrile overnight.  Blood cultures are showing no growth.  Sputum culture in process.  Strep pneumo antigen is negative.  Continue bronchodilators.  2. Dysphagia.   Seen by speech therapy with recommendations for dysphagia 2 diet. 3. Hypothyroidism. levothyroxine resumed.  Please check a TSH / FT4 in 1-2 weeks.  Did not check during acute illness.   4. Hyperlipidemia. continue atorvastatin.  5. Bipolar disorder. continue on home dose of psychotropic medications 6. Urinary retention.  Patient noted to have over 400 mL on bladder scan and  was unable to void.  Foley catheter was placed.  Started on Flomax.  Foley was removed on 04/06/19 and he has been voiding without difficulty since removed. Will continue flomax at discharge.  [facility] with [facility] in 2 weeks    DVT  prophylaxis: Lovenox Code Status: Full code Family Communication: None present Disposition Plan: Return to [facility]  Consultants:     Procedures:     Antimicrobials:   Levofloxacin 2/20 >  Vancomycin 2/20 > 2/21   Discharge Diagnoses:  Active Problems:   Bipolar disorder in full remission ([place])   Aspiration pneumonia ([place])   Acute respiratory failure with hypoxia ([facility])   Dysphagia   Hypothyroidism   HLD (hyperlipidemia)   Discharge Instructions: Discharge Instructions    Ambulatory referral to Urology   Complete by: As directed      Allergies as of 04/07/2019      Reactions   Penicillins Swelling   Has patient had a PCN reaction causing immediate rash, facial/tongue/throat swelling, SOB or lightheadedness with hypotension: Unknown Has patient had a PCN reaction causing severe rash involving mucus membranes or skin necrosis: Unknown Has patient had a PCN reaction that required hospitalization: Unknown Has patient had a PCN reaction occurring within the last 10 years: No If all of the above answers are "NO", then may proceed with Cephalosporin use.      Medication List    TAKE these medications   acetaminophen 650 MG CR tablet Commonly known as: TYLENOL Take 650 mg by mouth every 8 (eight) hours as needed for pain. Takes tylenol arthritis 650 mg as needed   atorvastatin 20 MG tablet Commonly known as: LIPITOR Take 20 mg by mouth daily.   clotrimazole-betamethasone cream Commonly known as: LOTRISONE Apply 1 application topically 2 (two) times daily as needed (rash).   divalproex 250 MG DR tablet Commonly known as: Depakote Take 1 tablet (250 mg total) by mouth at bedtime.   dronabinol 2.5 MG capsule Commonly known as: MARINOL Take 2.5 mg by mouth 2 (two) times daily before a meal.   Enstilar 0.005-0.064 % Foam Generic drug: Calcipotriene-Betameth Diprop Apply topically. Use as directed   famotidine 20 MG tablet Commonly known as:  Pepcid Take 1 tablet (20 mg total) by mouth as needed for heartburn or indigestion (May use once to twice daily).   guaiFENesin 100 MG/5ML liquid Commonly known as: ROBITUSSIN Take 200 mg by mouth every 6 (six) hours as needed for cough.   hydrocortisone 2.5 % cream Apply 1 application topically daily. *Apply to face, scalp, neck, and behind the ears   ketoconazole 2 % shampoo Commonly known as: NIZORAL Apply 1 application topically daily.   ketoconazole 2 % cream Commonly known as: NIZORAL Apply 1 application topically daily. Applied to the face, scalp, neck, and behind the ears   levofloxacin 750 MG tablet Commonly known as: LEVAQUIN Take 1 tablet (750 mg total) by mouth daily for 2 days. Start taking on: April 08, 2019   levothyroxine 88 MCG tablet Commonly known as: SYNTHROID Take 88 mcg by mouth daily before breakfast.   Magnesium Oxide 400 (240 Mg) MG Tabs Take 1 tablet by mouth 2 (two) times daily.   PARoxetine 20 MG tablet Commonly known as: PAXIL Take 1 tablet (20 mg total) by mouth daily.   QUEtiapine 400 MG tablet Commonly known as: SEROQUEL Take 1 tablet (400 mg total) by mouth at bedtime.   QUEtiapine 50 MG tablet Commonly known as: SEROQUEL Take  1 tablet (50 mg total) by mouth at bedtime.   sennosides-docusate sodium 8.6-50 MG tablet Commonly known as: SENOKOT-S Take 1 tablet by mouth daily as needed for constipation.   tamsulosin 0.4 MG Caps capsule Commonly known as: FLOMAX Take 1 capsule (0.4 mg total) by mouth daily after supper.      Follow-up Information    Rosita Fire, MD. Schedule an appointment as soon as possible for a visit in 2 week(s).   Specialty: Internal Medicine Contact information: [place] [address] [phone]        [place]. Schedule an appointment as soon as possible for a visit in 2 week(s).   Specialty: Urology Why: Establish care for BPH and urinary  retention Contact information: [phone]         Allergies  Allergen Reactions  . Penicillins Swelling    Has patient had a PCN reaction causing immediate rash, facial/tongue/throat swelling, SOB or lightheadedness with hypotension: Unknown Has patient had a PCN reaction causing severe rash involving mucus membranes or skin necrosis: Unknown Has patient had a PCN reaction that required hospitalization: Unknown Has patient had a PCN reaction occurring within the last 10 years: No If all of the above answers are "NO", then may proceed with Cephalosporin use.    Allergies as of 04/07/2019      Reactions   Penicillins Swelling   Has patient had a PCN reaction causing immediate rash, facial/tongue/throat swelling, SOB or lightheadedness with hypotension: Unknown Has patient had a PCN reaction causing severe rash involving mucus membranes or skin necrosis: Unknown Has patient had a PCN reaction that required hospitalization: Unknown Has patient had a PCN reaction occurring within the last 10 years: No If all of the above answers are "NO", then may proceed with Cephalosporin use.      Medication List    TAKE these medications   acetaminophen 650 MG CR tablet Commonly known as: TYLENOL Take 650 mg by mouth every 8 (eight) hours as needed for pain. Takes tylenol arthritis 650 mg as needed   atorvastatin 20 MG tablet Commonly known as: LIPITOR Take 20 mg by mouth daily.   clotrimazole-betamethasone cream Commonly known as: LOTRISONE Apply 1 application topically 2 (two) times daily as needed (rash).   divalproex 250 MG DR tablet Commonly known as: Depakote Take 1 tablet (250 mg total) by mouth at bedtime.   dronabinol 2.5 MG capsule Commonly known as: MARINOL Take 2.5 mg by mouth 2 (two) times daily before a meal.   Enstilar 0.005-0.064 % Foam Generic drug: Calcipotriene-Betameth Diprop Apply topically. Use as directed   famotidine 20 MG tablet Commonly known as:  Pepcid Take 1 tablet (20 mg total) by mouth as needed for heartburn or indigestion (May use once to twice daily).   guaiFENesin 100 MG/5ML liquid Commonly known as: ROBITUSSIN Take 200 mg by mouth every 6 (six) hours as needed for cough.   hydrocortisone 2.5 % cream Apply 1 application topically daily. *Apply to face, scalp, neck, and behind the ears   ketoconazole 2 % shampoo Commonly known as: NIZORAL Apply 1 application topically daily.   ketoconazole 2 % cream Commonly known as: NIZORAL Apply 1 application topically daily. Applied to the face, scalp, neck, and behind the ears   levofloxacin 750 MG tablet Commonly known as: LEVAQUIN Take 1 tablet (750 mg total) by mouth daily for 2 days. Start taking on: April 08, 2019   levothyroxine 88 MCG tablet Commonly known as: SYNTHROID Take  88 mcg by mouth daily before breakfast.   Magnesium Oxide 400 (240 Mg) MG Tabs Take 1 tablet by mouth 2 (two) times daily.   PARoxetine 20 MG tablet Commonly known as: PAXIL Take 1 tablet (20 mg total) by mouth daily.   QUEtiapine 400 MG tablet Commonly known as: SEROQUEL Take 1 tablet (400 mg total) by mouth at bedtime.   QUEtiapine 50 MG tablet Commonly known as: SEROQUEL Take 1 tablet (50 mg total) by mouth at bedtime.   sennosides-docusate sodium 8.6-50 MG tablet Commonly known as: SENOKOT-S Take 1 tablet by mouth daily as needed for constipation.   tamsulosin 0.4 MG Caps capsule Commonly known as: FLOMAX Take 1 capsule (0.4 mg total) by mouth daily after supper.      Procedures/Studies: DG Chest Port 1 View  Result Date: 04/04/2019 CLINICAL DATA:  69 year old male with history of productive cough for the past 2 days. EXAM: PORTABLE CHEST 1 VIEW COMPARISON:  Chest x-ray 02/06/2019. FINDINGS: Lung volumes are normal. No consolidative airspace disease. No pleural effusions. No evidence of pulmonary edema. Heart size is normal. Large hiatal hernia. Upper mediastinal contours  are within normal limits. Aortic atherosclerosis. IMPRESSION: 1. No radiographic evidence of acute cardiopulmonary disease. 2. Large hiatal hernia. 3. Aortic atherosclerosis. Electronically Signed   By: Vinnie Langton M.D.   On: 04/04/2019 10:33     Subjective: Pt has been eating and drinking well.  No respiratory complaints or distress.  He has been voiding since foley was removed.    Discharge Exam: Vitals:   04/07/19 0502 04/07/19 0746  BP: (!) 155/79   Pulse: (!) 107   Resp: 18   Temp: 98.7 F (37.1 C)   SpO2: 95% 95%   Vitals:   04/06/19 2104 04/07/19 0058 04/07/19 0502 04/07/19 0746  BP:   (!) 155/79   Pulse:   (!) 107   Resp:   18   Temp:   98.7 F (37.1 C)   TempSrc:      SpO2: 93% 93% 95% 95%  Weight:      Height:       General: Pt is alert, awake, not in acute distress Cardiovascular:  Normal S1/S2 +, no rubs, no gallops Respiratory: CTA bilaterally, no wheezing, no rhonchi Abdominal: Soft, NT, ND, bowel sounds + Extremities: no edema, no cyanosis   The results of significant diagnostics from this hospitalization (including imaging, microbiology, ancillary and laboratory) are listed below for reference.     Microbiology: Recent Results (from the past 240 hour(s))  Blood Culture (routine x 2)     Status: None (Preliminary result)   Collection Time: 04/04/19  9:50 AM   Specimen: BLOOD LEFT HAND  Result Value Ref Range Status   Specimen Description BLOOD LEFT HAND  Final   Special Requests   Final    BOTTLES DRAWN AEROBIC AND ANAEROBIC Blood Culture adequate volume   Culture   Final    NO GROWTH 3 DAYS Performed at [facility], [address], [place], [place] [address]    Report Status PENDING  Incomplete  Blood Culture (routine x 2)     Status: None (Preliminary result)   Collection Time: 04/04/19 10:11 AM   Specimen: BLOOD RIGHT ARM  Result Value Ref Range Status   Specimen Description BLOOD RIGHT ARM  Final   Special Requests   Final    BOTTLES  DRAWN AEROBIC AND ANAEROBIC Blood Culture adequate volume   Culture   Final    NO GROWTH  3 DAYS Performed at [facility], [address], [place], [place] [address]    Report Status PENDING  Incomplete  Urine culture     Status: None   Collection Time: 04/04/19  1:03 PM   Specimen: In/Out Cath Urine  Result Value Ref Range Status   Specimen Description   Final    IN/OUT CATH URINE Performed at [facility], [address] [place], [place] [address]    Special Requests   Final    NONE Performed at [facility], [address] [place], [place] [address]    Culture   Final    NO GROWTH Performed at [facility], [place] [address], [place], [place] [address]    Report Status 04/06/2019 FINAL  Final  MRSA PCR Screening     Status: None   Collection Time: 04/04/19  2:01 PM   Specimen: Nasal Mucosa; Nasopharyngeal  Result Value Ref Range Status   MRSA by PCR NEGATIVE NEGATIVE Final    Comment:        The GeneXpert MRSA Assay (FDA approved for NASAL specimens only), is one component of a comprehensive MRSA colonization surveillance program. It is not intended to diagnose MRSA infection nor to guide or monitor treatment for MRSA infections. Performed at [facility], [address], [place], [place] [address]   Culture, respiratory     Status: None   Collection Time: 04/04/19  9:04 PM   Specimen: SPU  Result Value Ref Range Status   Specimen Description   Final    SPUTUM Performed at [facility], [address], [place], [place] [address]    Special Requests   Final    NONE Performed at [facility], [address], [place], [place] [address]    Gram Stain   Final    FEW WBC PRESENT, PREDOMINANTLY PMN MODERATE GRAM POSITIVE COCCI IN PAIRS IN CLUSTERS FEW GRAM NEGATIVE RODS FEW GRAM POSITIVE RODS    Culture   Final    Consistent with normal respiratory flora. Performed at [facility], [place] [address] [place], [place] [address]    Report Status 04/07/2019  FINAL  Final     Labs: BNP (last 3 results) No results for input(s): BNP in the last 8760 hours. Basic Metabolic Panel: Recent Labs  Lab 04/04/19 0950 04/05/19 0514 04/06/19 0449  NA 137 137 137  K 4.1 4.2 3.7  CL 102 104 104  CO2 23 25 23   GLUCOSE 148* 102* 144*  BUN 19 26* 18  CREATININE 1.34* 1.31* 1.03  CALCIUM 8.5* 8.1* 8.2*   Liver Function Tests: Recent Labs  Lab 04/04/19 0950 04/05/19 0514  AST 21 19  ALT 15 12  ALKPHOS 81 57  BILITOT 1.2 1.2  PROT 7.8 6.6  ALBUMIN 3.7 2.9*   No results for input(s): LIPASE, AMYLASE in the last 168 hours. No results for input(s): AMMONIA in the last 168 hours. CBC: Recent Labs  Lab 04/04/19 0950 04/05/19 0514 04/06/19 0449  WBC 13.7* 8.5 9.7  NEUTROABS 11.4*  --   --   HGB 14.6 13.0 12.6*  HCT 44.3 41.0 38.6*  MCV 97.8 100.2* 98.0  PLT 250 265 268   Cardiac Enzymes: No results for input(s): CKTOTAL, CKMB, CKMBINDEX, TROPONINI in the last 168 hours. BNP: Invalid input(s): POCBNP CBG: Recent Labs  Lab 04/04/19 1628 04/05/19 1144  GLUCAP 161* 84   D-Dimer No results for input(s): DDIMER in the last 72 hours. Hgb A1c No results for  input(s): HGBA1C in the last 72 hours. Lipid Profile No results for input(s): CHOL, HDL, LDLCALC, TRIG, CHOLHDL, LDLDIRECT in the last 72 hours. Thyroid function studies No results for input(s): TSH, T4TOTAL, T3FREE, THYROIDAB in the last 72 hours.  Invalid input(s): FREET3 Anemia work up No results for input(s): VITAMINB12, FOLATE, FERRITIN, TIBC, IRON, RETICCTPCT in the last 72 hours. Urinalysis    Component Value Date/Time   COLORURINE YELLOW 04/04/2019 1303   APPEARANCEUR CLEAR 04/04/2019 1303   LABSPEC 1.027 04/04/2019 1303   PHURINE 5.0 04/04/2019 1303   GLUCOSEU NEGATIVE 04/04/2019 1303   HGBUR NEGATIVE 04/04/2019 1303   BILIRUBINUR NEGATIVE 04/04/2019 1303   KETONESUR 5 (A) 04/04/2019 1303   PROTEINUR 30 (A) 04/04/2019 1303   UROBILINOGEN 1.0 04/15/2013 2138    NITRITE NEGATIVE 04/04/2019 1303   LEUKOCYTESUR NEGATIVE 04/04/2019 1303   Sepsis Labs Invalid input(s): PROCALCITONIN,  WBC,  LACTICIDVEN Microbiology Recent Results (from the past 240 hour(s))  Blood Culture (routine x 2)     Status: None (Preliminary result)   Collection Time: 04/04/19  9:50 AM   Specimen: BLOOD LEFT HAND  Result Value Ref Range Status   Specimen Description BLOOD LEFT HAND  Final   Special Requests   Final    BOTTLES DRAWN AEROBIC AND ANAEROBIC Blood Culture adequate volume   Culture   Final    NO GROWTH 3 DAYS Performed at [facility], [address], [place], [place] [address]    Report Status PENDING  Incomplete  Blood Culture (routine x 2)     Status: None (Preliminary result)   Collection Time: 04/04/19 10:11 AM   Specimen: BLOOD RIGHT ARM  Result Value Ref Range Status   Specimen Description BLOOD RIGHT ARM  Final   Special Requests   Final    BOTTLES DRAWN AEROBIC AND ANAEROBIC Blood Culture adequate volume   Culture   Final    NO GROWTH 3 DAYS Performed at [facility], [address], [place], [place] [address]    Report Status PENDING  Incomplete  Urine culture     Status: None   Collection Time: 04/04/19  1:03 PM   Specimen: In/Out Cath Urine  Result Value Ref Range Status   Specimen Description   Final    IN/OUT CATH URINE Performed at [facility], [address] [place], [place] [address]    Special Requests   Final    NONE Performed at [facility], [address], [place], [place] [address]    Culture   Final    NO GROWTH Performed at [facility], [place] [address], [place], [place] [address]    Report Status 04/06/2019 FINAL  Final  MRSA PCR Screening     Status: None   Collection Time: 04/04/19  2:01 PM   Specimen: Nasal Mucosa; Nasopharyngeal  Result Value Ref Range Status   MRSA by PCR NEGATIVE NEGATIVE Final    Comment:        The GeneXpert MRSA Assay (FDA approved for NASAL specimens only), is one component of  a comprehensive MRSA colonization surveillance program. It is not intended to diagnose MRSA infection nor to guide or monitor treatment for MRSA infections. Performed at [facility], [address], [place], [place] [address]   Culture, respiratory     Status: None   Collection Time: 04/04/19  9:04 PM   Specimen: SPU  Result Value Ref Range Status   Specimen Description   Final    SPUTUM Performed at [facility], [address], [place], [place] [address]    Special Requests   Final    NONE Performed at [facility], [address], [place], [place] [address]    Gram Stain   Final    FEW WBC PRESENT, PREDOMINANTLY PMN MODERATE GRAM POSITIVE COCCI IN PAIRS IN CLUSTERS FEW GRAM NEGATIVE RODS FEW GRAM POSITIVE RODS    Culture   Final    Consistent with normal respiratory flora. Performed at [facility], [place] [address] [place], [place] [address]    Report Status 04/07/2019 FINAL  Final   Time coordinating discharge:  35 mins   SIGNED:  Irwin Brakeman, MD  [facility] 04/07/2019, 10:13 AM How to contact the [facility] Attending or Consulting provider Grand[facility] or covering provider during after hours [place], for this patient?  1. Check the care team in [facility] and look for a) attending/consulting [facility] provider listed and b) the [facility] team listed 2. Log into [email or web address] and use [facility]'s universal password to access. If you do not have the password, please contact the hospital operator. 3. Locate the [facility] provider you are looking for under [facility] and page to a number that you can be directly reached. 4. If you still have difficulty reaching the provider, please page the [facility] (Director on Call) for the Hospitalists listed on amion for assistance.

## 2019-04-07 NOTE — NC FL2 (Signed)
[place] LEVEL OF CARE SCREENING TOOL     IDENTIFICATION  Patient Name: Ricky Ward Birthdate: Oct 09, 1950 Sex: male Admission Date (Current Location): 04/04/2019  [facility] and [place] Number:  [employer] and Address:  Center [address] [place]      Provider Number: [id number]  Attending Physician Name and Address:  Murlean Iba, MD  Relative Name and Phone Number:       Current Level of Care: Hospital Recommended Level of Care: [place] Prior Approval Number:    Date Approved/Denied:   PASRR Number:    Discharge Plan: Domiciliary (Rest home)    Current Diagnoses: Patient Active Problem List   Diagnosis Date Noted  . BPH (benign prostatic hyperplasia) 04/07/2019  . Urinary retention due to benign prostatic hyperplasia 04/07/2019  . Bipolar 1 disorder ([place])   . Aspiration pneumonia ([place]) 04/04/2019  . Acute respiratory failure with hypoxia ([place]) 04/04/2019  . Dysphagia 04/04/2019  . Hypothyroidism 04/04/2019  . HLD (hyperlipidemia) 04/04/2019  . Heartburn 03/12/2019  . Preventative health care 04/28/2018  . Alcohol use disorder, mild, in sustained remission 10/09/2017    Orientation RESPIRATION BLADDER Height & Weight     Self, Time  Normal Incontinent Weight: 168 lb 6.9 oz (76.4 kg) Height:  5\' 7"  (170.2 cm)  BEHAVIORAL SYMPTOMS/MOOD NEUROLOGICAL BOWEL NUTRITION STATUS      Incontinent Diet(regular fine chopped)  AMBULATORY STATUS COMMUNICATION OF NEEDS Skin   Limited Assist Verbally Other (Comment)(ecchymosis on bilateral arms)                       Personal Care Assistance Level of Assistance  Bathing, Dressing, Feeding Bathing Assistance: Maximum assistance Feeding assistance: Independent Dressing Assistance: Limited assistance     Functional Limitations Info  Sight, Hearing, Speech Sight Info: Adequate Hearing Info: Adequate Speech Info: Adequate     SPECIAL CARE FACTORS FREQUENCY  PT (By licensed PT)     PT Frequency: 2-3 x/week with home health              Contractures Contractures Info: Not present    Additional Factors Info  Psychotropic Code Status Info: Full Allergies Info: Pencillins Psychotropic Info: Paxil, Seroquel         Current Medications (04/07/2019):  This is the current hospital active medication list Current Facility-Administered Medications  Medication Dose Route Frequency Provider Last Rate Last Admin  . 0.9 %  sodium chloride infusion   Intravenous Continuous Wynetta Emery, Clanford L, MD 40 mL/hr at 04/07/19 0839 New Bag at 04/07/19 0839  . acetaminophen (TYLENOL) tablet 650 mg  650 mg Oral Q6H Johnson, Clanford L, MD   650 mg at 04/07/19 [id number]   Or  . acetaminophen (TYLENOL) suppository 650 mg  650 mg Rectal Q6H Johnson, Clanford L, MD      . atorvastatin (LIPITOR) tablet 20 mg  20 mg Oral Daily Kathie Dike, MD   20 mg at 04/07/19 [id number]  . divalproex (DEPAKOTE) DR tablet 250 mg  250 mg Oral QHS Kathie Dike, MD   250 mg at 04/06/19 2120  . dronabinol (MARINOL) capsule 2.5 mg  2.5 mg Oral BID AC Kathie Dike, MD   2.5 mg at 04/07/19 0837  . enoxaparin (LOVENOX) injection 40 mg  40 mg Subcutaneous Q24H Kathie Dike, MD   40 mg at 04/06/19 1711  . guaiFENesin (ROBITUSSIN) 100 MG/5ML solution 200 mg  200 mg Oral Q6H PRN Memon,  Jolaine Artist, MD      . ipratropium-albuterol (DUONEB) 0.5-2.5 (3) MG/3ML nebulizer solution 3 mL  3 mL Nebulization Q6H Kathie Dike, MD   3 mL at 04/07/19 0746  . levofloxacin (LEVAQUIN) IVPB 750 mg  750 mg Intravenous Q24H Johnson, Clanford L, MD 100 mL/hr at 04/07/19 0840 750 mg at 04/07/19 0840  . levothyroxine (SYNTHROID) tablet 88 mcg  88 mcg Oral QAC breakfast Kathie Dike, MD   88 mcg at 04/07/19 0503  . MEDLINE mouth rinse  15 mL Mouth Rinse BID Kathie Dike, MD   15 mL at 04/07/19 [id number]  . PARoxetine (PAXIL) tablet 20 mg  20 mg Oral Daily Kathie Dike, MD    20 mg at 04/07/19 0837  . QUEtiapine (SEROQUEL) tablet 400 mg  400 mg Oral QHS Kathie Dike, MD   400 mg at 04/06/19 2120  . QUEtiapine (SEROQUEL) tablet 50 mg  50 mg Oral QHS Kathie Dike, MD   50 mg at 04/06/19 2119  . tamsulosin (FLOMAX) capsule 0.4 mg  0.4 mg Oral QPC supper Kathie Dike, MD   0.4 mg at 04/06/19 1711     Discharge Medications:  TAKE these medications   acetaminophen 650 MG CR tablet Commonly known as: TYLENOL Take 650 mg by mouth every 8 (eight) hours as needed for pain. Takes tylenol arthritis 650 mg as needed   atorvastatin 20 MG tablet Commonly known as: LIPITOR Take 20 mg by mouth daily.   clotrimazole-betamethasone cream Commonly known as: LOTRISONE Apply 1 application topically 2 (two) times daily as needed (rash).   divalproex 250 MG DR tablet Commonly known as: Depakote Take 1 tablet (250 mg total) by mouth at bedtime.   dronabinol 2.5 MG capsule Commonly known as: MARINOL Take 2.5 mg by mouth 2 (two) times daily before a meal.   Enstilar 0.005-0.064 % Foam Generic drug: Calcipotriene-Betameth Diprop Apply topically. Use as directed   famotidine 20 MG tablet Commonly known as: Pepcid Take 1 tablet (20 mg total) by mouth as needed for heartburn or indigestion (May use once to twice daily).   guaiFENesin 100 MG/5ML liquid Commonly known as: ROBITUSSIN Take 200 mg by mouth every 6 (six) hours as needed for cough.   hydrocortisone 2.5 % cream Apply 1 application topically daily. *Apply to face, scalp, neck, and behind the ears   ketoconazole 2 % shampoo Commonly known as: NIZORAL Apply 1 application topically daily.   ketoconazole 2 % cream Commonly known as: NIZORAL Apply 1 application topically daily. Applied to the face, scalp, neck, and behind the ears   levofloxacin 750 MG tablet Commonly known as: LEVAQUIN Take 1 tablet (750 mg total) by mouth daily for 2 days. Start taking on: April 08, 2019    levothyroxine 88 MCG tablet Commonly known as: SYNTHROID Take 88 mcg by mouth daily before breakfast.   Magnesium Oxide 400 (240 Mg) MG Tabs Take 1 tablet by mouth 2 (two) times daily.   PARoxetine 20 MG tablet Commonly known as: PAXIL Take 1 tablet (20 mg total) by mouth daily.   QUEtiapine 400 MG tablet Commonly known as: SEROQUEL Take 1 tablet (400 mg total) by mouth at bedtime.   QUEtiapine 50 MG tablet Commonly known as: SEROQUEL Take 1 tablet (50 mg total) by mouth at bedtime.   sennosides-docusate sodium 8.6-50 MG tablet Commonly known as: SENOKOT-S Take 1 tablet by mouth daily as needed for constipation.   tamsulosin 0.4 MG Caps capsule Commonly known as: FLOMAX Take 1 capsule (0.4 mg  total) by mouth daily after supper.        Relevant Imaging Results:  Relevant Lab Results:   Additional Information SSN [id number], [place]

## 2019-04-07 NOTE — Progress Notes (Signed)
Patient discharging back to [facility] today. AVS prepared and placed in discharge packet to be sent with patient at discharge. Patient in stable condition awaiting facility transport for discharge.

## 2019-04-07 NOTE — TOC Transition Note (Signed)
Transition of Care [facility]) - CM/SW Discharge Note   Patient Details  Name: GUERDON ARAGON MRN: [id number] Date of Birth: [birth year]  Transition of Care [facility]) CM/SW Contact:  Shade Flood, LCSW Phone Number: 04/07/2019, 10:54 AM   Clinical Narrative:     Pt stable for dc today per MD. Plan remains for return to [facility]. DC clinical sent electronically. [facility] staff will come get pt at 1300. Updated pt's RN.  [place] referral sent to [facility].   Updated pt's sister by phone. There are no other TOC needs for dc.      Barriers to Discharge: Barriers Resolved   Patient Goals and CMS Choice        Discharge Placement                       Discharge Plan and Services In-house Referral: Clinical Social Work                        HH Arranged: RN, PT [place] Date [place]: 04/07/19   Representative spoke with at [place]: Cassie  Social Determinants of Health ([place]) Interventions     Readmission Risk Interventions No flowsheet data found.

## 2019-04-07 NOTE — Care Management Important Message (Signed)
Important Message  Patient Details  Name: Ricky Ward MRN: [id number] Date of Birth: [birth year]   Medicare Important Message Given:  Yes     Tommy Medal 04/07/2019, 11:12 AM

## 2019-04-07 NOTE — Discharge Instructions (Signed)
TAKE LEVOFLOXACIN FOR 2 DAYS ONLY AND THEN STOP   IMPORTANT INFORMATION: PAY CLOSE ATTENTION   PHYSICIAN DISCHARGE INSTRUCTIONS  Follow with Primary care provider  Rosita Fire, MD  and other consultants as instructed by your Hospitalist Physician  [place] IF SYMPTOMS COME BACK, WORSEN OR NEW PROBLEM DEVELOPS   Please note: You were cared for by a hospitalist during your hospital stay. Every effort will be made to forward records to your primary care provider.  You can request that your primary care provider send for your hospital records if they have not received them.  Once you are discharged, your primary care physician will handle any further medical issues. Please note that NO REFILLS for any discharge medications will be authorized once you are discharged, as it is imperative that you return to your primary care physician (or establish a relationship with a primary care physician if you do not have one) for your post hospital discharge needs so that they can reassess your need for medications and monitor your lab values.  Please get a complete blood count and chemistry panel checked by your Primary MD at your next visit, and again as instructed by your Primary MD.  Get Medicines reviewed and adjusted: Please take all your medications with you for your next visit with your Primary MD  Laboratory/radiological data: Please request your Primary MD to go over all hospital tests and procedure/radiological results at the follow up, please ask your primary care provider to get all Hospital records sent to his/her office.  In some cases, they will be blood work, cultures and biopsy results pending at the time of your discharge. Please request that your primary care provider follow up on these results.  If you are diabetic, please bring your blood sugar readings with you to your follow up appointment with primary care.    Please call and make your follow  up appointments as soon as possible.    Also Note the following: If you experience worsening of your admission symptoms, develop shortness of breath, life threatening emergency, suicidal or homicidal thoughts you must seek medical attention immediately by calling 911 or calling your MD immediately  if symptoms less severe.  You must read complete instructions/literature along with all the possible adverse reactions/side effects for all the Medicines you take and that have been prescribed to you. Take any new Medicines after you have completely understood and accpet all the possible adverse reactions/side effects.   Do not drive when taking Pain medications or sleeping medications (Benzodiazepines)  Do not take more than prescribed Pain, Sleep and Anxiety Medications. It is not advisable to combine anxiety,sleep and pain medications without talking with your primary care practitioner  Special Instructions: If you have smoked or chewed Tobacco  in the last 2 yrs please stop smoking, stop any regular Alcohol  and or any Recreational drug use.  Wear Seat belts while driving.  Do not drive if taking any narcotic, mind altering or controlled substances or recreational drugs or alcohol.

## 2019-04-08 DIAGNOSIS — J690 Pneumonitis due to inhalation of food and vomit: Secondary | ICD-10-CM | POA: Diagnosis not present

## 2019-04-08 DIAGNOSIS — Z8616 Personal history of COVID-19: Secondary | ICD-10-CM | POA: Diagnosis not present

## 2019-04-08 DIAGNOSIS — F3172 Bipolar disorder, in full remission, most recent episode hypomanic: Secondary | ICD-10-CM | POA: Diagnosis not present

## 2019-04-08 DIAGNOSIS — M6281 Muscle weakness (generalized): Secondary | ICD-10-CM | POA: Diagnosis not present

## 2019-04-08 DIAGNOSIS — R1310 Dysphagia, unspecified: Secondary | ICD-10-CM | POA: Diagnosis not present

## 2019-04-09 DIAGNOSIS — F3172 Bipolar disorder, in full remission, most recent episode hypomanic: Secondary | ICD-10-CM | POA: Diagnosis not present

## 2019-04-09 DIAGNOSIS — J690 Pneumonitis due to inhalation of food and vomit: Secondary | ICD-10-CM | POA: Diagnosis not present

## 2019-04-09 DIAGNOSIS — R1310 Dysphagia, unspecified: Secondary | ICD-10-CM | POA: Diagnosis not present

## 2019-04-09 DIAGNOSIS — M6281 Muscle weakness (generalized): Secondary | ICD-10-CM | POA: Diagnosis not present

## 2019-04-09 DIAGNOSIS — Z8616 Personal history of COVID-19: Secondary | ICD-10-CM | POA: Diagnosis not present

## 2019-04-09 LAB — CULTURE, BLOOD (ROUTINE X 2)
Culture: NO GROWTH
Culture: NO GROWTH
Special Requests: ADEQUATE
Special Requests: ADEQUATE

## 2019-04-10 DIAGNOSIS — M6281 Muscle weakness (generalized): Secondary | ICD-10-CM | POA: Diagnosis not present

## 2019-04-10 DIAGNOSIS — F3172 Bipolar disorder, in full remission, most recent episode hypomanic: Secondary | ICD-10-CM | POA: Diagnosis not present

## 2019-04-10 DIAGNOSIS — J690 Pneumonitis due to inhalation of food and vomit: Secondary | ICD-10-CM | POA: Diagnosis not present

## 2019-04-10 DIAGNOSIS — R1310 Dysphagia, unspecified: Secondary | ICD-10-CM | POA: Diagnosis not present

## 2019-04-10 DIAGNOSIS — Z8616 Personal history of COVID-19: Secondary | ICD-10-CM | POA: Diagnosis not present

## 2019-04-13 DIAGNOSIS — N400 Enlarged prostate without lower urinary tract symptoms: Secondary | ICD-10-CM | POA: Diagnosis not present

## 2019-04-13 DIAGNOSIS — R1310 Dysphagia, unspecified: Secondary | ICD-10-CM | POA: Diagnosis not present

## 2019-04-13 DIAGNOSIS — M6281 Muscle weakness (generalized): Secondary | ICD-10-CM | POA: Diagnosis not present

## 2019-04-13 DIAGNOSIS — Z8616 Personal history of COVID-19: Secondary | ICD-10-CM | POA: Diagnosis not present

## 2019-04-13 DIAGNOSIS — E039 Hypothyroidism, unspecified: Secondary | ICD-10-CM | POA: Diagnosis not present

## 2019-04-13 DIAGNOSIS — F3172 Bipolar disorder, in full remission, most recent episode hypomanic: Secondary | ICD-10-CM | POA: Diagnosis not present

## 2019-04-13 DIAGNOSIS — F319 Bipolar disorder, unspecified: Secondary | ICD-10-CM | POA: Diagnosis not present

## 2019-04-13 DIAGNOSIS — J690 Pneumonitis due to inhalation of food and vomit: Secondary | ICD-10-CM | POA: Diagnosis not present

## 2019-04-14 DIAGNOSIS — Z8616 Personal history of COVID-19: Secondary | ICD-10-CM | POA: Diagnosis not present

## 2019-04-14 DIAGNOSIS — R1310 Dysphagia, unspecified: Secondary | ICD-10-CM | POA: Diagnosis not present

## 2019-04-14 DIAGNOSIS — F3172 Bipolar disorder, in full remission, most recent episode hypomanic: Secondary | ICD-10-CM | POA: Diagnosis not present

## 2019-04-14 DIAGNOSIS — M6281 Muscle weakness (generalized): Secondary | ICD-10-CM | POA: Diagnosis not present

## 2019-04-14 DIAGNOSIS — J690 Pneumonitis due to inhalation of food and vomit: Secondary | ICD-10-CM | POA: Diagnosis not present

## 2019-04-15 DIAGNOSIS — M6281 Muscle weakness (generalized): Secondary | ICD-10-CM | POA: Diagnosis not present

## 2019-04-15 DIAGNOSIS — F3172 Bipolar disorder, in full remission, most recent episode hypomanic: Secondary | ICD-10-CM | POA: Diagnosis not present

## 2019-04-15 DIAGNOSIS — J690 Pneumonitis due to inhalation of food and vomit: Secondary | ICD-10-CM | POA: Diagnosis not present

## 2019-04-15 DIAGNOSIS — R1310 Dysphagia, unspecified: Secondary | ICD-10-CM | POA: Diagnosis not present

## 2019-04-15 DIAGNOSIS — Z8616 Personal history of COVID-19: Secondary | ICD-10-CM | POA: Diagnosis not present

## 2019-04-16 DIAGNOSIS — F3172 Bipolar disorder, in full remission, most recent episode hypomanic: Secondary | ICD-10-CM | POA: Diagnosis not present

## 2019-04-16 DIAGNOSIS — R1310 Dysphagia, unspecified: Secondary | ICD-10-CM | POA: Diagnosis not present

## 2019-04-16 DIAGNOSIS — Z8616 Personal history of COVID-19: Secondary | ICD-10-CM | POA: Diagnosis not present

## 2019-04-16 DIAGNOSIS — M6281 Muscle weakness (generalized): Secondary | ICD-10-CM | POA: Diagnosis not present

## 2019-04-16 DIAGNOSIS — J690 Pneumonitis due to inhalation of food and vomit: Secondary | ICD-10-CM | POA: Diagnosis not present

## 2019-04-20 DIAGNOSIS — R1310 Dysphagia, unspecified: Secondary | ICD-10-CM | POA: Diagnosis not present

## 2019-04-20 DIAGNOSIS — M6281 Muscle weakness (generalized): Secondary | ICD-10-CM | POA: Diagnosis not present

## 2019-04-20 DIAGNOSIS — F3172 Bipolar disorder, in full remission, most recent episode hypomanic: Secondary | ICD-10-CM | POA: Diagnosis not present

## 2019-04-20 DIAGNOSIS — E039 Hypothyroidism, unspecified: Secondary | ICD-10-CM | POA: Diagnosis not present

## 2019-04-20 DIAGNOSIS — E785 Hyperlipidemia, unspecified: Secondary | ICD-10-CM | POA: Diagnosis not present

## 2019-04-20 DIAGNOSIS — Z8616 Personal history of COVID-19: Secondary | ICD-10-CM | POA: Diagnosis not present

## 2019-04-20 DIAGNOSIS — J690 Pneumonitis due to inhalation of food and vomit: Secondary | ICD-10-CM | POA: Diagnosis not present

## 2019-04-21 DIAGNOSIS — J690 Pneumonitis due to inhalation of food and vomit: Secondary | ICD-10-CM | POA: Diagnosis not present

## 2019-04-21 DIAGNOSIS — Z8616 Personal history of COVID-19: Secondary | ICD-10-CM | POA: Diagnosis not present

## 2019-04-21 DIAGNOSIS — R1310 Dysphagia, unspecified: Secondary | ICD-10-CM | POA: Diagnosis not present

## 2019-04-21 DIAGNOSIS — F3172 Bipolar disorder, in full remission, most recent episode hypomanic: Secondary | ICD-10-CM | POA: Diagnosis not present

## 2019-04-21 DIAGNOSIS — M6281 Muscle weakness (generalized): Secondary | ICD-10-CM | POA: Diagnosis not present

## 2019-04-22 ENCOUNTER — Emergency Department (HOSPITAL_COMMUNITY): Payer: Medicare Other

## 2019-04-22 ENCOUNTER — Inpatient Hospital Stay: Payer: Self-pay

## 2019-04-22 ENCOUNTER — Inpatient Hospital Stay (HOSPITAL_COMMUNITY)
Admission: EM | Admit: 2019-04-22 | Discharge: 2019-04-25 | DRG: 040 | Disposition: A | Discharge status: Skilled Nursing Facility | Payer: Medicare Other | Source: Skilled Nursing Facility | Attending: Family Medicine | Admitting: Family Medicine

## 2019-04-22 DIAGNOSIS — W07XXXA Fall from chair, initial encounter: Secondary | ICD-10-CM | POA: Diagnosis present

## 2019-04-22 DIAGNOSIS — Z811 Family history of alcohol abuse and dependence: Secondary | ICD-10-CM | POA: Diagnosis not present

## 2019-04-22 DIAGNOSIS — Z87891 Personal history of nicotine dependence: Secondary | ICD-10-CM

## 2019-04-22 DIAGNOSIS — R402243 Coma scale, best verbal response, confused conversation, at hospital admission: Secondary | ICD-10-CM | POA: Diagnosis present

## 2019-04-22 DIAGNOSIS — S61212A Laceration without foreign body of right middle finger without damage to nail, initial encounter: Secondary | ICD-10-CM | POA: Diagnosis not present

## 2019-04-22 DIAGNOSIS — I63411 Cerebral infarction due to embolism of right middle cerebral artery: Secondary | ICD-10-CM | POA: Diagnosis present

## 2019-04-22 DIAGNOSIS — N1831 Chronic kidney disease, stage 3a: Secondary | ICD-10-CM | POA: Diagnosis present

## 2019-04-22 DIAGNOSIS — R471 Dysarthria and anarthria: Secondary | ICD-10-CM | POA: Diagnosis present

## 2019-04-22 DIAGNOSIS — R402363 Coma scale, best motor response, obeys commands, at hospital admission: Secondary | ICD-10-CM | POA: Diagnosis present

## 2019-04-22 DIAGNOSIS — I69320 Aphasia following cerebral infarction: Secondary | ICD-10-CM | POA: Diagnosis not present

## 2019-04-22 DIAGNOSIS — F319 Bipolar disorder, unspecified: Secondary | ICD-10-CM | POA: Diagnosis present

## 2019-04-22 DIAGNOSIS — R1310 Dysphagia, unspecified: Secondary | ICD-10-CM | POA: Diagnosis not present

## 2019-04-22 DIAGNOSIS — I63512 Cerebral infarction due to unspecified occlusion or stenosis of left middle cerebral artery: Secondary | ICD-10-CM | POA: Diagnosis not present

## 2019-04-22 DIAGNOSIS — M6281 Muscle weakness (generalized): Secondary | ICD-10-CM | POA: Diagnosis not present

## 2019-04-22 DIAGNOSIS — E785 Hyperlipidemia, unspecified: Secondary | ICD-10-CM | POA: Diagnosis present

## 2019-04-22 DIAGNOSIS — F3172 Bipolar disorder, in full remission, most recent episode hypomanic: Secondary | ICD-10-CM | POA: Diagnosis not present

## 2019-04-22 DIAGNOSIS — M2550 Pain in unspecified joint: Secondary | ICD-10-CM | POA: Diagnosis not present

## 2019-04-22 DIAGNOSIS — I6200 Nontraumatic subdural hemorrhage, unspecified: Secondary | ICD-10-CM | POA: Diagnosis not present

## 2019-04-22 DIAGNOSIS — I6202 Nontraumatic subacute subdural hemorrhage: Secondary | ICD-10-CM | POA: Diagnosis not present

## 2019-04-22 DIAGNOSIS — N179 Acute kidney failure, unspecified: Secondary | ICD-10-CM | POA: Diagnosis present

## 2019-04-22 DIAGNOSIS — S61213A Laceration without foreign body of left middle finger without damage to nail, initial encounter: Secondary | ICD-10-CM | POA: Diagnosis present

## 2019-04-22 DIAGNOSIS — I959 Hypotension, unspecified: Secondary | ICD-10-CM | POA: Diagnosis not present

## 2019-04-22 DIAGNOSIS — Z7401 Bed confinement status: Secondary | ICD-10-CM | POA: Diagnosis not present

## 2019-04-22 DIAGNOSIS — S065X9A Traumatic subdural hemorrhage with loss of consciousness of unspecified duration, initial encounter: Principal | ICD-10-CM | POA: Diagnosis present

## 2019-04-22 DIAGNOSIS — R29707 NIHSS score 7: Secondary | ICD-10-CM | POA: Diagnosis present

## 2019-04-22 DIAGNOSIS — R222 Localized swelling, mass and lump, trunk: Secondary | ICD-10-CM | POA: Diagnosis present

## 2019-04-22 DIAGNOSIS — S6992XA Unspecified injury of left wrist, hand and finger(s), initial encounter: Secondary | ICD-10-CM | POA: Diagnosis not present

## 2019-04-22 DIAGNOSIS — S065X0A Traumatic subdural hemorrhage without loss of consciousness, initial encounter: Secondary | ICD-10-CM | POA: Diagnosis not present

## 2019-04-22 DIAGNOSIS — E039 Hypothyroidism, unspecified: Secondary | ICD-10-CM | POA: Diagnosis present

## 2019-04-22 DIAGNOSIS — K219 Gastro-esophageal reflux disease without esophagitis: Secondary | ICD-10-CM | POA: Diagnosis present

## 2019-04-22 DIAGNOSIS — G8194 Hemiplegia, unspecified affecting left nondominant side: Secondary | ICD-10-CM | POA: Diagnosis present

## 2019-04-22 DIAGNOSIS — E7800 Pure hypercholesterolemia, unspecified: Secondary | ICD-10-CM | POA: Diagnosis present

## 2019-04-22 DIAGNOSIS — Z8616 Personal history of COVID-19: Secondary | ICD-10-CM

## 2019-04-22 DIAGNOSIS — R5381 Other malaise: Secondary | ICD-10-CM | POA: Diagnosis not present

## 2019-04-22 DIAGNOSIS — F1011 Alcohol abuse, in remission: Secondary | ICD-10-CM | POA: Diagnosis present

## 2019-04-22 DIAGNOSIS — S61219A Laceration without foreign body of unspecified finger without damage to nail, initial encounter: Secondary | ICD-10-CM

## 2019-04-22 DIAGNOSIS — R339 Retention of urine, unspecified: Secondary | ICD-10-CM | POA: Diagnosis present

## 2019-04-22 DIAGNOSIS — R402143 Coma scale, eyes open, spontaneous, at hospital admission: Secondary | ICD-10-CM | POA: Diagnosis present

## 2019-04-22 DIAGNOSIS — I639 Cerebral infarction, unspecified: Secondary | ICD-10-CM

## 2019-04-22 DIAGNOSIS — J690 Pneumonitis due to inhalation of food and vomit: Secondary | ICD-10-CM | POA: Diagnosis not present

## 2019-04-22 DIAGNOSIS — I6389 Other cerebral infarction: Secondary | ICD-10-CM | POA: Diagnosis not present

## 2019-04-22 DIAGNOSIS — Y92129 Unspecified place in nursing home as the place of occurrence of the external cause: Secondary | ICD-10-CM | POA: Diagnosis not present

## 2019-04-22 DIAGNOSIS — R0902 Hypoxemia: Secondary | ICD-10-CM | POA: Diagnosis not present

## 2019-04-22 DIAGNOSIS — R531 Weakness: Secondary | ICD-10-CM | POA: Diagnosis not present

## 2019-04-22 DIAGNOSIS — R414 Neurologic neglect syndrome: Secondary | ICD-10-CM | POA: Diagnosis present

## 2019-04-22 DIAGNOSIS — Z20822 Contact with and (suspected) exposure to covid-19: Secondary | ICD-10-CM | POA: Diagnosis present

## 2019-04-22 DIAGNOSIS — W19XXXA Unspecified fall, initial encounter: Secondary | ICD-10-CM | POA: Diagnosis not present

## 2019-04-22 DIAGNOSIS — S065XAA Traumatic subdural hemorrhage with loss of consciousness status unknown, initial encounter: Secondary | ICD-10-CM

## 2019-04-22 LAB — I-STAT CHEM 8, ED
BUN: 22 mg/dL (ref 8–23)
Calcium, Ion: 1 mmol/L — ABNORMAL LOW (ref 1.15–1.40)
Chloride: 100 mmol/L (ref 98–111)
Creatinine, Ser: 1.6 mg/dL — ABNORMAL HIGH (ref 0.61–1.24)
Glucose, Bld: 121 mg/dL — ABNORMAL HIGH (ref 70–99)
HCT: 40 % (ref 39.0–52.0)
Hemoglobin: 13.6 g/dL (ref 13.0–17.0)
Potassium: 4.7 mmol/L (ref 3.5–5.1)
Sodium: 137 mmol/L (ref 135–145)
TCO2: 28 mmol/L (ref 22–32)

## 2019-04-22 LAB — CBC
HCT: 40.3 % (ref 39.0–52.0)
Hemoglobin: 13 g/dL (ref 13.0–17.0)
MCH: 32.2 pg (ref 26.0–34.0)
MCHC: 32.3 g/dL (ref 30.0–36.0)
MCV: 99.8 fL (ref 80.0–100.0)
Platelets: 378 10*3/uL (ref 150–400)
RBC: 4.04 MIL/uL — ABNORMAL LOW (ref 4.22–5.81)
RDW: 14.8 % (ref 11.5–15.5)
WBC: 6.4 10*3/uL (ref 4.0–10.5)
nRBC: 0 % (ref 0.0–0.2)

## 2019-04-22 LAB — DIFFERENTIAL
Abs Immature Granulocytes: 0.02 10*3/uL (ref 0.00–0.07)
Basophils Absolute: 0.1 10*3/uL (ref 0.0–0.1)
Basophils Relative: 1 %
Eosinophils Absolute: 0.1 10*3/uL (ref 0.0–0.5)
Eosinophils Relative: 2 %
Immature Granulocytes: 0 %
Lymphocytes Relative: 36 %
Lymphs Abs: 2.3 10*3/uL (ref 0.7–4.0)
Monocytes Absolute: 0.9 10*3/uL (ref 0.1–1.0)
Monocytes Relative: 13 %
Neutro Abs: 3 10*3/uL (ref 1.7–7.7)
Neutrophils Relative %: 48 %

## 2019-04-22 LAB — SODIUM: Sodium: 136 mmol/L (ref 135–145)

## 2019-04-22 LAB — RESPIRATORY PANEL BY RT PCR (FLU A&B, COVID)
Influenza A by PCR: NEGATIVE
Influenza B by PCR: NEGATIVE
SARS Coronavirus 2 by RT PCR: NEGATIVE

## 2019-04-22 LAB — PROTIME-INR
INR: 1 (ref 0.8–1.2)
Prothrombin Time: 13.1 seconds (ref 11.4–15.2)

## 2019-04-22 LAB — COMPREHENSIVE METABOLIC PANEL
ALT: 18 U/L (ref 0–44)
AST: 27 U/L (ref 15–41)
Albumin: 3.2 g/dL — ABNORMAL LOW (ref 3.5–5.0)
Alkaline Phosphatase: 58 U/L (ref 38–126)
Anion gap: 8 (ref 5–15)
BUN: 18 mg/dL (ref 8–23)
CO2: 27 mmol/L (ref 22–32)
Calcium: 8.5 mg/dL — ABNORMAL LOW (ref 8.9–10.3)
Chloride: 100 mmol/L (ref 98–111)
Creatinine, Ser: 1.58 mg/dL — ABNORMAL HIGH (ref 0.61–1.24)
GFR calc Af Amer: 51 mL/min — ABNORMAL LOW (ref >60)
GFR calc non Af Amer: 44 mL/min — ABNORMAL LOW (ref >60)
Glucose, Bld: 127 mg/dL — ABNORMAL HIGH (ref 70–99)
Potassium: 4.7 mmol/L (ref 3.5–5.1)
Sodium: 135 mmol/L (ref 135–145)
Total Bilirubin: 0.5 mg/dL (ref 0.3–1.2)
Total Protein: 7.4 g/dL (ref 6.5–8.1)

## 2019-04-22 LAB — APTT: aPTT: 32 seconds (ref 24–36)

## 2019-04-22 MED ORDER — LEVETIRACETAM IN NACL 1500 MG/100ML IV SOLN
1500.0000 mg | INTRAVENOUS | Status: AC
  Administered 2019-04-22: 1500 mg via INTRAVENOUS
  Filled 2019-04-22: qty 100

## 2019-04-22 MED ORDER — SODIUM CHLORIDE 0.9% FLUSH: 3.0000 mL | Freq: Once | INTRAVENOUS | Status: DC

## 2019-04-22 MED ORDER — SODIUM CHLORIDE 3 % IV SOLN
INTRAVENOUS | Status: DC
  Administered 2019-04-22: 75 mL/h via INTRAVENOUS
  Filled 2019-04-22 (×3): qty 500

## 2019-04-22 MED ORDER — LIDOCAINE HCL (PF) 1 % IJ SOLN
5.0000 mL | Freq: Once | INTRAMUSCULAR | Status: DC
  Filled 2019-04-22: qty 5

## 2019-04-22 MED ORDER — LEVETIRACETAM IN NACL 500 MG/100ML IV SOLN
500.0000 mg | Freq: Two times a day (BID) | INTRAVENOUS | Status: DC
  Administered 2019-04-23 – 2019-04-24 (×3): 500 mg via INTRAVENOUS
  Filled 2019-04-22 (×3): qty 100

## 2019-04-22 MED ORDER — MORPHINE SULFATE (PF) 2 MG/ML IV SOLN: 2.0000 mg | INTRAVENOUS | Status: DC | PRN

## 2019-04-22 MED ORDER — LABETALOL HCL 5 MG/ML IV SOLN: 10.0000 mg | INTRAVENOUS | Status: DC | PRN

## 2019-04-22 MED ORDER — ACETAMINOPHEN 325 MG PO TABS: 650.0000 mg | ORAL_TABLET | Freq: Four times a day (QID) | ORAL | Status: DC | PRN

## 2019-04-22 NOTE — Consult Note (Signed)
Neurosurgery Consultation  Reason for Consult: Subdural hematoma Referring Physician: Orpah Melter  CC: Left sided weakness  HPI: This is a 69 y.o. man that presents with left sided weakness roughly 6h ago, self limited. He reports to me that he had this once before a few years ago, he went to the hospital and it was self-limited, but this may have been the stroke seen on his [place]. He had a fall w/ a finger lac, no other known injuries at this time. Currently he denies any new weakness, numbness, or parasthesias, no recent change in bowel or bladder function. No recent use of anti-platelet or anti-coagulant medications. He endorses a 'stinging' sensation on the left side prior to the event, but it didn't sound like any aura that I am familiar with, did not have a cortical marching pattern either.   ROS: A 14 point ROS was performed and is negative except as noted in the HPI.   PMHx:  Past Medical History:  Diagnosis Date  . Bipolar 1 disorder ([place])   . Constipation   . Hypercholesterolemia   . Psoriasis   . Thyroid disease    FamHx:  Family History  Problem Relation Age of Onset  . Alcohol abuse Brother   . Colon cancer Neg Hx    SocHx:  reports that he has quit smoking. His smoking use included cigarettes. He has never used smokeless tobacco. He reports that he does not drink alcohol or use drugs.  Exam: Vital signs in last 24 hours: Temp:  [97 F (36.1 C)-98.1 F (36.7 C)] 98.1 F (36.7 C) (03/11 0058) Pulse Rate:  [72-84] 83 (03/11 0100) Resp:  [10-24] 24 (03/11 0100) BP: (102-126)/(57-86) 108/70 (03/11 0100) SpO2:  [98 %-100 %] 98 % (03/11 0100) Weight:  [76.6 kg] 76.6 kg (03/10 1900) General: Awake, alert, cooperative, lying in bed in NAD Head: normocephalic and atruamatic HEENT: neck supple Pulmonary: breathing room air comfortably, no evidence of increased work of breathing Cardiac: RRR Abdomen: S NT ND Extremities: warm and well perfused x4 Neuro: AOx3, PERRL,  bilateral XT, FS Strength 5/5 x4, SILTx4, no drift +does appear to have some left sided neglect  Assessment and Plan: 69 y.o. man s/p fall w/ self-limited period of left sided weakness. [place] personally reviewed, which shows subacute left sided subdural hematoma with 37mm of MLS. I agree that this is not likely the cause of his symptoms, will follow to make sure it doesn't become an issue going forward.   -no acute neurosurgical intervention indicated at this time -of note, when I saw the pt, his hypertonic saline had infiltrated in his PIV. Per nursing it's improving, he had normal sensation, motor function, and distal pulses in the RUE when I saw him at 01:25. -please call with any concerns or questions  Judith Part, MD 04/23/19 1:24 AM [facility]

## 2019-04-22 NOTE — Consult Note (Signed)
Neurology Consultation  Reason for Consult: Code stroke for left-sided weakness Referring Physician: Dr. Adrian Prows  CC: Left-sided weakness  History is obtained from: EMS  HPI: Ricky Ward is a 69 y.o. male past medical history of hyperlipidemia, hypothyroidism and bipolar disorder, resident of a assisted living facility, last seen normal around 6 PM and then noted to be stumbling and having a fall at 6:15 PM and ensuing left-sided weakness noted by EMS.  They noted a flaccid left arm and leg and brought him in as an acute code stroke. In the initial evaluation in the emergency room, patient was drifting on the left side and had some left-sided neglect.  Taken for stat CT head that showed a large left hemispheric subdural hematoma. He is not on any blood thinners.  He was unable to provide good history and appeared confused.  He was awake during the time of the evaluation.  Unclear if he had any preceding headaches  LKW: 6 PM on 04/22/2019 tpa given?: no, large subdural hematoma Premorbid modified Rankin scale (mRS): Unknown-no family at bedside.  ROS: Unable to obtain due to his confusion  Past Medical History:  Diagnosis Date  . Bipolar 1 disorder ([place])   . Constipation   . Hypercholesterolemia   . Psoriasis   . Thyroid disease     Family History  Problem Relation Age of Onset  . Alcohol abuse Brother   . Colon cancer Neg Hx    Social History:   reports that he has quit smoking. His smoking use included cigarettes. He has never used smokeless tobacco. He reports that he does not drink alcohol or use drugs.  Medications  Current Facility-Administered Medications:  .  sodium chloride flush (NS) 0.9 % injection 3 mL, 3 mL, Intravenous, Once, Carmin Muskrat, MD  Current Outpatient Medications:  .  acetaminophen (TYLENOL) 650 MG CR tablet, Take 650 mg by mouth every 8 (eight) hours as needed for pain. Takes tylenol arthritis 650 mg as needed, Disp: , Rfl:  .   atorvastatin (LIPITOR) 20 MG tablet, Take 20 mg by mouth daily., Disp: , Rfl:  .  Calcipotriene-Betameth Diprop (ENSTILAR) 0.005-0.064 % FOAM, Apply topically. Use as directed, Disp: , Rfl:  .  clotrimazole-betamethasone (LOTRISONE) cream, Apply 1 application topically 2 (two) times daily as needed (rash)., Disp: , Rfl:  .  divalproex (DEPAKOTE) 250 MG DR tablet, Take 1 tablet (250 mg total) by mouth at bedtime., Disp: 90 tablet, Rfl: 0 .  dronabinol (MARINOL) 2.5 MG capsule, Take 2.5 mg by mouth 2 (two) times daily before a meal., Disp: , Rfl:  .  famotidine (PEPCID) 20 MG tablet, Take 1 tablet (20 mg total) by mouth as needed for heartburn or indigestion (May use once to twice daily)., Disp: 60 tablet, Rfl: 5 .  guaiFENesin (ROBITUSSIN) 100 MG/5ML liquid, Take 200 mg by mouth every 6 (six) hours as needed for cough., Disp: , Rfl:  .  hydrocortisone 2.5 % cream, Apply 1 application topically daily. *Apply to face, scalp, neck, and behind the ears, Disp: , Rfl:  .  ketoconazole (NIZORAL) 2 % cream, Apply 1 application topically daily. Applied to the face, scalp, neck, and behind the ears, Disp: , Rfl:  .  ketoconazole (NIZORAL) 2 % shampoo, Apply 1 application topically daily., Disp: , Rfl:  .  levothyroxine (SYNTHROID) 88 MCG tablet, Take 88 mcg by mouth daily before breakfast. , Disp: , Rfl:  .  Magnesium Oxide 400 (240 Mg) MG TABS, Take 1  tablet by mouth 2 (two) times daily., Disp: , Rfl:  .  PARoxetine (PAXIL) 20 MG tablet, Take 1 tablet (20 mg total) by mouth daily., Disp: 90 tablet, Rfl: 1 .  QUEtiapine (SEROQUEL) 400 MG tablet, Take 1 tablet (400 mg total) by mouth at bedtime., Disp: 90 tablet, Rfl: 1 .  QUEtiapine (SEROQUEL) 50 MG tablet, Take 1 tablet (50 mg total) by mouth at bedtime., Disp: 30 tablet, Rfl: 0 .  sennosides-docusate sodium (SENOKOT-S) 8.6-50 MG tablet, Take 1 tablet by mouth daily as needed for constipation., Disp: , Rfl:  .  tamsulosin (FLOMAX) 0.4 MG CAPS capsule, Take 1  capsule (0.4 mg total) by mouth daily after supper., Disp: 30 capsule, Rfl: 0   Exam: Current vital signs: BP (!) 105/57   Pulse 72   Temp (!) 97 F (36.1 C) (Axillary)   Resp 18   Ht 5\' 7"  (1.702 m)   Wt 76.6 kg   SpO2 99%   BMI 26.45 kg/m  Vital signs in last 24 hours: Temp:  [97 F (36.1 C)] 97 F (36.1 C) (03/10 1905) Pulse Rate:  [72] 72 (03/10 1905) Resp:  [18] 18 (03/10 1905) BP: (105)/(57) 105/57 (03/10 1905) SpO2:  [99 %] 99 % (03/10 1905) Weight:  [76.6 kg] 76.6 kg (03/10 1900) General: Is awake alert in no distress Shinkle normocephalic atraumatic Lungs clear to consultation Cardiovascular regular rate rhythm Abdomen soft nondistended nontender Extremities warm well perfused Neurological exam Awake alert oriented to self.  Was able to tell me his year of birth but not his age.  Was not able to tell me the date. Speech is dysarthric He is able to repeat sentences and name objects but has poor attention concentration. He is able to follow some commands on the right but not consistently on the left Cranial nerves: Pupils are equal round reactive light, extraocular movements appear intact although he does seem to have a right gaze preference, he does blink to threat inconsistently from both sides, no facial asymmetry noted. Motor exam there was mild drift on the left upper and lower extremity which on coaching could be removed and had no drift at one point.  Right side no drift. Sensory exam: Intact to touch with some extinction with double simultaneous stimulation on the left side. Coordination: Difficult to perform due to his inattention NIHSS 1a Level of Conscious.: 0 1b LOC Questions: 2 1c LOC Commands: 0 2 Best Gaze: 1 3 Visual: 0 4 Facial Palsy: 0 5a Motor Arm - left: 1 5b Motor Arm - Right: 0 6a Motor Leg - Left: 1 6b Motor Leg - Right: 0 7 Limb Ataxia: 0 8 Sensory: 0 9 Best Language: 0 10 Dysarthria: 1 11 Extinct. and Inatten.: 1 TOTAL:  7  Labs I have reviewed labs in epic and the results pertinent to this consultation are: CBC    Component Value Date/Time   WBC 9.7 04/06/2019 0449   RBC 3.94 (L) 04/06/2019 0449   HGB 12.6 (L) 04/06/2019 0449   HCT 38.6 (L) 04/06/2019 0449   PLT 268 04/06/2019 0449   MCV 98.0 04/06/2019 0449   MCH 32.0 04/06/2019 0449   MCHC 32.6 04/06/2019 0449   RDW 14.6 04/06/2019 0449   LYMPHSABS 0.9 04/04/2019 0950   MONOABS 1.2 (H) 04/04/2019 0950   EOSABS 0.0 04/04/2019 0950   BASOSABS 0.1 04/04/2019 0950   CMP     Component Value Date/Time   NA 137 04/06/2019 0449   K 3.7 04/06/2019 0449  CL 104 04/06/2019 0449   CO2 23 04/06/2019 0449   GLUCOSE 144 (H) 04/06/2019 0449   BUN 18 04/06/2019 0449   CREATININE 1.03 04/06/2019 0449   CREATININE 1.68 (H) 01/06/2019 0758   CALCIUM 8.2 (L) 04/06/2019 0449   PROT 6.6 04/05/2019 0514   ALBUMIN 2.9 (L) 04/05/2019 0514   AST 19 04/05/2019 0514   ALT 12 04/05/2019 0514   ALKPHOS 57 04/05/2019 0514   BILITOT 1.2 04/05/2019 0514   GFRNONAA >60 04/06/2019 0449   GFRNONAA 44 (L) 10/10/2018 0826   GFRAA >60 04/06/2019 0449   GFRAA 51 (L) 10/10/2018 0826   Imaging I have reviewed the images obtained: CT-scan of the brain large left-sided subdural hematoma with 7 mm rightward shift.  Assessment: 69 year old man past history of hyperlipidemia hypothyroidism and bipolar disorder coming in with symptoms of left-sided weakness and noted to have a left-sided subdural hematoma.  His symptoms do not really correlate to the imaging findings but his exam is inconsistent he is awake but appears confused and I suspect there might be a component of aphasia although he was able to repeat and follow commands inconsistently. Given the size of the subdural hematoma in the shift, he would require neurosurgical consultation emergently. He is not on any blood thinners-does not require any reversal Will require an MRI to make sure there is no right-sided  ischemic stroke.  Even if there is an ischemic stroke, he is not a candidate for TPA due to the acute bleed and will not be a candidate for any intervention due to relatively low stroke scale as well as acute bleed  Other considerations are that he might have had a seizure that led to a fall because of this hematoma.  Impression: Left-sided subdural hematoma  Recommendations: Emergent neurosurgical consultation I would recommend 3% saline Systolic blood pressures less than 140 No need for reversal as he is not on any blood thinners.  Check PT/INR level. Load with Keppra 1500 IV x1 followed by 500 twice daily. Obtain an EEG in the morning. Plan relayed to ED provider Please call neurology as needed  -- Amie Portland, MD [facility] Pager: [phone] If 7pm to 7am, please call on call as listed on AMION.   CRITICAL CARE ATTESTATION Performed by: Amie Portland, MD Total critical care time: 30 minutes Critical care time was exclusive of separately billable procedures and treating other patients and/or supervising APPs/Residents/Students Critical care was necessary to treat or prevent imminent or life-threatening deterioration due to stroke like symptoms, Subdural hematoma. This patient is critically ill and at significant risk for neurological worsening and/or death and care requires constant monitoring. Critical care was time spent personally by me on the following activities: development of treatment plan with patient and/or surrogate as well as nursing, discussions with consultants, evaluation of patient's response to treatment, examination of patient, obtaining history from patient or surrogate, ordering and performing treatments and interventions, ordering and review of laboratory studies, ordering and review of radiographic studies, pulse oximetry, re-evaluation of patient's condition, participation in multidisciplinary rounds and medical decision making of high complexity  in the care of this patient.

## 2019-04-22 NOTE — ED Notes (Signed)
Pt is from [facility] in [place]

## 2019-04-22 NOTE — ED Notes (Signed)
EEG at bedside.

## 2019-04-22 NOTE — ED Notes (Signed)
Pt was not interested in drinking for swallow screen at this time.

## 2019-04-22 NOTE — ED Provider Notes (Signed)
[place] [facility] EMERGENCY DEPARTMENT Provider Note   CSN: [id number] Arrival date & time: 04/22/19  1856     History No chief complaint on file.   Ricky Ward is a 69 y.o. male.  HPI   Patient presents as a code stroke.  Patient arrives via EMS.  Seemingly the patient's last normal time was about 1 hour prior to ED arrival, 1815.  EMS providers provide much of the history as the patient is slowed speech, does not provide substantial details of the event.  Seemingly patient had noticeable change in speech, facial asymmetry, and left-sided weakness.  In transport his speech has improved, but he has persistent slowness. Patient denies pain, does not answer other questions with any depth, or substantial detail, level 5 caveat secondary to acuity of condition.  Past Medical History:  Diagnosis Date  . Bipolar 1 disorder ([place])   . Constipation   . Hypercholesterolemia   . Psoriasis   . Thyroid disease     Patient Active Problem List   Diagnosis Date Noted  . BPH (benign prostatic hyperplasia) 04/07/2019  . Urinary retention due to benign prostatic hyperplasia 04/07/2019  . Bipolar 1 disorder ([place])   . Aspiration pneumonia ([place]) 04/04/2019  . Acute respiratory failure with hypoxia ([place]) 04/04/2019  . Dysphagia 04/04/2019  . Hypothyroidism 04/04/2019  . HLD (hyperlipidemia) 04/04/2019  . Heartburn 03/12/2019  . Preventative health care 04/28/2018  . Alcohol use disorder, mild, in sustained remission 10/09/2017    Past Surgical History:  Procedure Laterality Date  . FOOT SURGERY  08/23/2003  . WRIST FRACTURE SURGERY Left        Family History  Problem Relation Age of Onset  . Alcohol abuse Brother   . Colon cancer Neg Hx     Social History   Tobacco Use  . Smoking status: Former Smoker    Types: Cigarettes  . Smokeless tobacco: Never Used  Substance Use Topics  . Alcohol use: No  . Drug use: No    Home Medications Prior to Admission  medications   Medication Sig Start Date End Date Taking? Authorizing Provider  acetaminophen (TYLENOL) 650 MG CR tablet Take 650 mg by mouth every 8 (eight) hours as needed for pain. Takes tylenol arthritis 650 mg as needed    [provider]  atorvastatin (LIPITOR) 20 MG tablet Take 20 mg by mouth daily.    [provider]  Calcipotriene-Betameth Diprop (ENSTILAR) 0.005-0.064 % FOAM Apply topically. Use as directed    [provider]  clotrimazole-betamethasone (LOTRISONE) cream Apply 1 application topically 2 (two) times daily as needed (rash).    [provider]  divalproex (DEPAKOTE) 250 MG DR tablet Take 1 tablet (250 mg total) by mouth at bedtime. 02/27/19   Orlene Erm, MD  dronabinol (MARINOL) 2.5 MG capsule Take 2.5 mg by mouth 2 (two) times daily before a meal.    [provider]  famotidine (PEPCID) 20 MG tablet Take 1 tablet (20 mg total) by mouth as needed for heartburn or indigestion (May use once to twice daily). 03/12/19   Erenest Rasher, PA-C  guaiFENesin (ROBITUSSIN) 100 MG/5ML liquid Take 200 mg by mouth every 6 (six) hours as needed for cough.    [provider]  hydrocortisone 2.5 % cream Apply 1 application topically daily. *Apply to face, scalp, neck, and behind the ears    [provider]  ketoconazole (NIZORAL) 2 % cream Apply 1 application topically daily. Applied to the  face, scalp, neck, and behind the ears    [provider]  ketoconazole (NIZORAL) 2 % shampoo Apply 1 application topically daily.    [provider]  levothyroxine (SYNTHROID) 88 MCG tablet Take 88 mcg by mouth daily before breakfast.     [provider]  Magnesium Oxide 400 (240 Mg) MG TABS Take 1 tablet by mouth 2 (two) times daily. 01/23/19   [provider]  PARoxetine (PAXIL) 20 MG tablet Take 1 tablet (20 mg total) by mouth daily. 02/27/19   Orlene Erm, MD  QUEtiapine (SEROQUEL) 400 MG tablet  Take 1 tablet (400 mg total) by mouth at bedtime. 02/27/19   Orlene Erm, MD  QUEtiapine (SEROQUEL) 50 MG tablet Take 1 tablet (50 mg total) by mouth at bedtime. 02/27/19   Orlene Erm, MD  sennosides-docusate sodium (SENOKOT-S) 8.6-50 MG tablet Take 1 tablet by mouth daily as needed for constipation.    [provider]  tamsulosin (FLOMAX) 0.4 MG CAPS capsule Take 1 capsule (0.4 mg total) by mouth daily after supper. 04/07/19   Murlean Iba, MD    Allergies    Penicillins  Review of Systems   Review of Systems  Unable to perform ROS: Acuity of condition    Physical Exam Updated Vital Signs There were no vitals taken for this visit.  Physical Exam Vitals and nursing note reviewed.  Constitutional:      General: He is not in acute distress.    Appearance: He is well-developed.     Comments: Elderly M in NAD, withdrawn but interactive but directly spoken with.  HENT:     Head: Normocephalic and atraumatic.     Comments: No appreciable facial asymmetry, though speech is difficult to understand. Eyes:     Conjunctiva/sclera: Conjunctivae normal.  Cardiovascular:     Rate and Rhythm: Normal rate and regular rhythm.  Pulmonary:     Effort: Pulmonary effort is normal. No respiratory distress.     Breath sounds: No stridor.  Abdominal:     General: There is no distension.  Musculoskeletal:        General: No deformity.  Skin:    General: Skin is warm and dry.  Neurological:     Mental Status: He is alert and oriented to person, place, and time.     Comments: Dysarthria appreciable.  Patient moves all extremities appropriately to command, has 4/5 strength bilaterally in his lower extremities.  Patient does not move the left arm spontaneously.  Psychiatric:        Behavior: Behavior is slowed and withdrawn.        Cognition and Memory: Cognition is impaired.     ED Results / Procedures / Treatments   Labs (all labs ordered are listed, but only abnormal  results are displayed) Labs Reviewed  CBC - Abnormal; Notable for the following components:      Result Value   RBC 4.04 (*)    All other components within normal limits  COMPREHENSIVE METABOLIC PANEL - Abnormal; Notable for the following components:   Glucose, Bld 127 (*)    Creatinine, Ser 1.58 (*)    Calcium 8.5 (*)    Albumin 3.2 (*)    GFR calc non Af Amer 44 (*)    GFR calc Af Amer 51 (*)    All other components within normal limits  I-STAT CHEM 8, ED - Abnormal; Notable for the following components:   Creatinine, Ser 1.60 (*)  Glucose, Bld 121 (*)    Calcium, Ion 1.00 (*)    All other components within normal limits  PROTIME-INR  APTT  DIFFERENTIAL  CBG MONITORING, ED    EKG EKG Interpretation  Date/Time:  Wednesday April 22 2019 19:18:06 EST Ventricular Rate:  72 PR Interval:    QRS Duration: 94 QT Interval:  392 QTC Calculation: 429 R Axis:   53 Text Interpretation: Sinus rhythm RSR' in V1 or V2, probably normal variant Baseline wander Abnormal ECG Confirmed by Carmin Muskrat [phone]) on 04/22/2019 8:08:40 PM   Radiology CT HEAD CODE STROKE WO CONTRAST  Addendum Date: 04/22/2019   ADDENDUM REPORT: 04/22/2019 19:21 ADDENDUM: Study discussed by telephone with Dr. Rory Percy on 04/22/2019 at 1916 hours. Electronically Signed   By: Genevie Ann M.D.   On: 04/22/2019 19:21   Result Date: 04/22/2019 CLINICAL DATA:  Code stroke. 69 year old male with left side weakness and slurred speech. EXAM: CT HEAD WITHOUT CONTRAST TECHNIQUE: Contiguous axial images were obtained from the base of the skull through the vertex without intravenous contrast. COMPARISON:  Report of [facility] noncontrast head CT 04/20/2013 (no images available). FINDINGS: Brain: Left side mixed density but mostly hypodense subdural hematoma throughout much of the hemisphere measuring up to 7 mm in thickness (series 2, image 23 and coronal image 34). Small areas of hyperdense  blood products within the subdural collection. Intracranial mass effect with rightward midline shift of 6-7 mm. Mild mass effect on the left lateral ventricle. No ventriculomegaly. Basilar cisterns remain patent. Superimposed chronic appearing cortical encephalomalacia affecting the anterior left insula and inferior frontal gyrus (coronal image 26). Patchy hemisphere white matter hypodensity elsewhere. No other intracranial hemorrhage identified. No superimposed cortically based acute infarct identified. Vascular: Calcified atherosclerosis at the skull base. No suspicious intracranial vascular hyperdensity. Skull: No skull fracture identified. No acute osseous abnormality identified. Sinuses/Orbits: Visualized paranasal sinuses and mastoids are clear. Other: Disconjugate gaze. No acute orbit or scalp soft tissue finding. ASPECTS [facility] Stroke Program Early CT Score) Total score (0-10 with 10 being normal): 10 (chronic left MCA infarct). IMPRESSION: 1. Positive for left side Subdural Hematoma measuring up to 7 mm in thickness. Intracranial mass effect with rightward midline shift of 7 mm. 2. Underlying chronic anterior division Left MCA infarct. No acute cortically based infarct identified. 3. These results were communicated to Dr. Lorraine Lax at 7:13 pm on 04/22/2019 by text page via the [facility] messaging system. Electronically Signed: By: Genevie Ann M.D. On: 04/22/2019 19:15    Procedures Procedures (including critical care time)  LACERATION REPAIR #1 Performed by: Carmin Muskrat Authorized by: Carmin Muskrat Consent: Verbal consent obtained. Risks and benefits: risks, benefits and alternatives were discussed Consent given by: patient Patient identity confirmed: provided demographic data Prepped and Draped in normal sterile fashion Wound explored  Laceration Location: finger #2, R anterior prox PIP  Laceration Length: 3.5cm  No Foreign Bodies seen or palpated  Anesthesia: local infiltration  Local  anesthetic: lidocaine 1% no epinephrine - ring block  Anesthetic total: 4 ml  Irrigation method: syringe Amount of cleaning: standard  Skin closure: 5-0 sutures  Number of sutures: 3  Technique: close  Patient tolerance: Patient tolerated the procedure well with no immediate complications.  LACERATION REPAIR - #2 Performed by: Carmin Muskrat Authorized by: Carmin Muskrat Consent: Verbal consent obtained. Risks and benefits: risks, benefits and alternatives were discussed Consent given by: patient Patient identity confirmed: provided demographic data Prepped and Draped in normal sterile  fashion Wound explored  Laceration Location: third r digit distal PIP  Laceration Length: 3.5cm  No Foreign Bodies seen or palpated  Anesthesia: local infiltration  Local anesthetic: lidocaine 1% no epinephrine  Anesthetic total: 3 ml  Irrigation method: syringe Amount of cleaning: standard  Skin closure: 5-0  Number of sutures: 4  Technique: close  Patient tolerance: Patient tolerated the procedure well with no immediate complications.   CRITICAL CARE Performed by: Carmin Muskrat Total critical care time: 35 minutes Critical care time was exclusive of separately billable procedures and treating other patients. Critical care was necessary to treat or prevent imminent or life-threatening deterioration. Critical care was time spent personally by me on the following activities: development of treatment plan with patient and/or surrogate as well as nursing, discussions with consultants, evaluation of patient's response to treatment, examination of patient, obtaining history from patient or surrogate, ordering and performing treatments and interventions, ordering and review of laboratory studies, ordering and review of radiographic studies, pulse oximetry and re-evaluation of patient's condition.   Medications Ordered in ED Medications  sodium chloride flush (NS) 0.9 % injection  3 mL (has no administration in time range)  levETIRAcetam (KEPPRA) IVPB 1500 mg/ 100 mL premix (has no administration in time range)  levETIRAcetam (KEPPRA) IVPB 500 mg/100 mL premix (has no administration in time range)    ED Course  I have reviewed the triage vital signs and the nursing notes.  Pertinent labs & imaging results that were available during my care of the patient were reviewed by me and considered in my medical decision making (see chart for details).  On repeat exam patient in similar condition. Patient CT scan notable for demonstration of left-sided subdural hematoma approximately 7 mm, with mixed density suggesting possible acute on chronic or subacute episode.  Patient does have midline shift as well.  I discussed the patient's case again with our neurologist with whom we have been coordinating care.  With concern for intracranial hemorrhage, possible seizure contributing to today's abrupt change in neurologic functioning, with physical exam findings inconsistent with head bleed on the left, the patient will receive antiepileptic medications, will require admission.  I discussed the patient's case with our neurology colleagues, neurosurgery colleagues and critical care colleagues again given the complexity of his presentation.  I reviewed exam the patient's hand wound is explored, and on the left hand it is clear that the patient has 2 lacerations each approximately 3.5 cm one on the second digit proximal interphalangeal joint with visible tendon beneath, the other on the third digit and the distal interphalangeal joint with subcutaneous fat, but no other soft tissue visible.  Patient inconsistently follows commands and it is difficult to ascertain if he can fully flex the distal portions of each digit, though he seems to do so.  This may be secondary to the patient's hemorrhage, will likely require additional evaluation during this hospitalization.  Final Clinical Impression(s)  / ED Diagnoses Final diagnoses:  Subdural hematoma (HCC)  Finger laceration, initial encounter     Carmin Muskrat, MD 04/23/19 0009

## 2019-04-22 NOTE — ED Triage Notes (Signed)
Per EMS pt coming from facility in [place] for left sided weakness after fall onset of 1815. Reports injury to left fingers.

## 2019-04-22 NOTE — ED Notes (Signed)
Pharmacy called for Keppra

## 2019-04-22 NOTE — Progress Notes (Signed)
Received order for PICC.  PICC will be placed in am.

## 2019-04-22 NOTE — H&P (Signed)
NAME:  Ricky Ward, MRN:  [id number], DOB:  December 18, 1950, LOS: 0 ADMISSION DATE:  04/22/2019, CONSULTATION DATE:  04/22/19 REFERRING MD:  Dr. Vanita Panda, CHIEF COMPLAINT:  SDH  Brief History   41 yoM presenting from assisted living after developing left sided weakness after fall.  LKW 1815.  Presented as code stroke with EMS.  CT noted for large left hemispheric subdural hematoma with rightward midline shift of 7 mm.  Also noted to have left finger laceration which was sutured in ER.  Neurology and [place] consulting.  Started on hypertonic saline, protecting airway, remains hemodynamically stable, and some improvement in left sided weakness in ER.  PCCM called for admit.   History of present illness   69 year old male with prior history of bipolor disorder, hypothyroidism, HLD, prior COVID 19 infection 01/2019, and dysphagia presenting from [place] home for onset of left sided weakness and left finger, onset at 1815.  Patient reports he was sitting in chair and fell backwards but denies hitting head.  He reports he is ambulatory at baseline.  Presented by EMS as code stroke.    Of note, recent admit 2/20- 2/23 for hypoxic respiratory failure and pneumonia suspected to be related to dysphagia with recommendations for dysphagia 2 diet; also noted to have some urinary retention.   In ER, he has been afebrile and normotensive and saturating well on room air.  CT head which revealed a large left hemispheric subdural hematoma.  He is not on blood thinners.  Labs significant for glucose 127, sCr 1.58 (prevoiusly 1.03- 1.3), normal coags, and stable H/H.  Evaluated by Neurology and Neurosurgery in ER and placed on hypertonic saline.  No surgical intervention at this time.  Left finger laceration sutured in ER.  Since being in ER, patient with stable mental status, protecting airway, and improved left sided weakness. PCCM called for admit.   Past Medical History  bipolor disorder, hypothyroidism, HLD,  COVID 19 01/2019, dysphagia  Significant Hospital Events   3/10 admit  Consults:  Neurology NSGY  Procedures:  NA  Significant Diagnostic Tests:  3/10 CTH >> 1. Positive for left side Subdural Hematoma measuring up to 7 mm in thickness. Intracranial mass effect with rightward midline shift of 7 mm. 2. Underlying chronic anterior division Left MCA infarct. No acute cortically based infarct identified.  3/10 left hand XR >> No acute osseous abnormality  Micro Data:  3/10 SARS 2/ flu A/B >> pending  Antimicrobials:  None   Interim history/subjective:  NA   Objective   Blood pressure 123/67, pulse 73, temperature (!) 97 F (36.1 C), temperature source Axillary, resp. rate 11, height 5\' 7"  (1.702 m), weight 76.6 kg, SpO2 99 %.        Intake/Output Summary (Last 24 hours) at 04/22/2019 2211 Last data filed at 04/22/2019 2118 Gross per 24 hour  Intake 99.44 ml  Output --  Net 99.44 ml   Filed Weights   04/22/19 1900  Weight: 76.6 kg    Examination: General: PAtietn alert and oriented x 3 talkative and generally pleasant HENT: Redness on left side of head(chronic per patient) edentulous Lungs: CTAB. No increased WOB Cardiovascular: RRR. Normal pulses Abdomen: soft non tender and non distended Extremities: There is a laceration on left lateral digits Neuro: Strength noted to be equal in upper and lower extremities at 5/5 maybe some subtle deficits on the left but no prominent. Which seems improved from previous reports. He is able to participate with exam although  sometimes seems confuse. CN 2-12 appear intact. Able to smile with no droop.  GU: Deferred  Resolved Hospital Problem list    NA  Assessment & Plan:  This is a 69 yo with history of bipolar, hypothyroid, and dyslipidemia who is a resident at [facility].   Subdural hemorrhage-Patient presented as code stroke with left sided weakness and neglect. Patient notes he just fell back in chair but folks from the  nursing home noted that he had left sided weakness. NSG was conuslted in Ed. No intervention at this time. Neurology did recommend Hypertonic saline. It is currently at 75 ml/hr -Hypertonic saline per neurology -Keppra per neurology -Maintain systolic less than [id number] per neurology -Neuro checks Q 1 hour for now -Hold prophylactic AC -aspiration precautions. NPO for now. Can get formal swallow in AM if concerns arise/ Was a dysphagia 2 on last admission    AKI on CKD-Patient baseline renal function appears to be closer to 1.3. It is 1.6 currenlty -Avoid nephrotoxins.  -Bladder scan as patietn doe shave history of retention in the past  Bipolar disorder- . On home seroquel , depakot, and paxil -Restart these in AM once patietn given diet  Hypothyroidism-History of. Synthroid does not appear on MAR although on last discharge it appears he was told to take synthroid 53mcg -Will check TSH and T4 -Consider resuming pending lab studies. May need to have pharmacy clarify this in AM     Best practice:  Diet: NPO for now Pain/Anxiety/Delirium protocol (if indicated): Tylenol and morphine VAP protocol (if indicated): NA DVT prophylaxis: NA GI prophylaxis: NA Glucose control: NA Mobility: Hold for now Code Status: Full for now Family Communication: NA Disposition: TBD  Labs   CBC: Recent Labs  Lab 04/22/19 1911 04/22/19 1924  WBC 6.4  --   NEUTROABS 3.0  --   HGB 13.0 13.6  HCT 40.3 40.0  MCV 99.8  --   PLT 378  --     Basic Metabolic Panel: Recent Labs  Lab 04/22/19 1911 04/22/19 1924  NA 135 137  K 4.7 4.7  CL 100 100  CO2 27  --   GLUCOSE 127* 121*  BUN 18 22  CREATININE 1.58* 1.60*  CALCIUM 8.5*  --    GFR: Estimated Creatinine Clearance: 40.2 mL/min (A) (by C-G formula based on SCr of 1.6 mg/dL (H)). Recent Labs  Lab 04/22/19 1911  WBC 6.4    Liver Function Tests: Recent Labs  Lab 04/22/19 1911  AST 27  ALT 18  ALKPHOS 58  BILITOT 0.5  PROT 7.4    ALBUMIN 3.2*   No results for input(s): LIPASE, AMYLASE in the last 168 hours. No results for input(s): AMMONIA in the last 168 hours.  ABG    Component Value Date/Time   TCO2 28 04/22/2019 1924     Coagulation Profile: Recent Labs  Lab 04/22/19 1911  INR 1.0    Cardiac Enzymes: No results for input(s): CKTOTAL, CKMB, CKMBINDEX, TROPONINI in the last 168 hours.  HbA1C: No results found for: HGBA1C  CBG: No results for input(s): GLUCAP in the last 168 hours.  Review of Systems:   A 14 point ROS was negative outside of what was noted above.   Past Medical History  He,  has a past medical history of Bipolar 1 disorder ([place]), Constipation, Hypercholesterolemia, Psoriasis, and Thyroid disease.   Surgical History    Past Surgical History:  Procedure Laterality Date  . FOOT SURGERY  08/23/2003  . WRIST FRACTURE  SURGERY Left      Social History   reports that he has quit smoking. His smoking use included cigarettes. He has never used smokeless tobacco. He reports that he does not drink alcohol or use drugs.   Family History   His family history includes Alcohol abuse in his brother. There is no history of Colon cancer.   Allergies Allergies  Allergen Reactions  . Penicillins Swelling    Has patient had a PCN reaction causing immediate rash, facial/tongue/throat swelling, SOB or lightheadedness with hypotension: Unknown Has patient had a PCN reaction causing severe rash involving mucus membranes or skin necrosis: Unknown Has patient had a PCN reaction that required hospitalization: Unknown Has patient had a PCN reaction occurring within the last 10 years: No If all of the above answers are "NO", then may proceed with Cephalosporin use.      Home Medications  Prior to Admission medications   Medication Sig Start Date End Date Taking? Authorizing Provider  atorvastatin (LIPITOR) 20 MG tablet Take 20 mg by mouth daily.   Yes [provider]   Calcipotriene-Betameth Diprop (ENSTILAR) 0.005-0.064 % FOAM Apply topically. Use as directed   Yes [provider]  clotrimazole-betamethasone (LOTRISONE) cream Apply 1 application topically 2 (two) times daily as needed (rash).   Yes [provider]  divalproex (DEPAKOTE) 250 MG DR tablet Take 1 tablet (250 mg total) by mouth at bedtime. 02/27/19  Yes Orlene Erm, MD  dronabinol (MARINOL) 2.5 MG capsule Take 2.5 mg by mouth 2 (two) times daily before a meal.   Yes [provider]  famotidine (PEPCID) 20 MG tablet Take 1 tablet (20 mg total) by mouth as needed for heartburn or indigestion (May use once to twice daily). Patient taking differently: Take 20 mg by mouth 2 (two) times daily as needed for heartburn or indigestion (May use once to twice daily).  03/12/19  Yes Erenest Rasher, PA-C  hydrocortisone 2.5 % cream Apply 1 application topically daily. *Apply to face, scalp, neck, and behind the ears   Yes [provider]  ketoconazole (NIZORAL) 2 % cream Apply 1 application topically daily. Applied to the face, scalp, neck, and behind the ears   Yes [provider]  sennosides-docusate sodium (SENOKOT-S) 8.6-50 MG tablet Take 1 tablet by mouth daily as needed for constipation.   Yes [provider]  ketoconazole (NIZORAL) 2 % shampoo Apply 1 application topically daily.    [provider]  PARoxetine (PAXIL) 20 MG tablet Take 1 tablet (20 mg total) by mouth daily. Patient not taking: Reported on 04/22/2019 02/27/19   Orlene Erm, MD  QUEtiapine (SEROQUEL) 400 MG tablet Take 1 tablet (400 mg total) by mouth at bedtime. Patient not taking: Reported on 04/22/2019 02/27/19   Orlene Erm, MD  QUEtiapine (SEROQUEL) 50 MG tablet Take 1 tablet (50 mg total) by mouth at bedtime. Patient not taking: Reported on 04/22/2019 02/27/19   Orlene Erm, MD  tamsulosin (FLOMAX) 0.4 MG CAPS capsule Take 1 capsule (0.4 mg total) by mouth  daily after supper. Patient not taking: Reported on 04/22/2019 04/07/19   Murlean Iba, MD     Critical care time: 64

## 2019-04-22 NOTE — Progress Notes (Signed)
EEG complete - results pending 

## 2019-04-22 NOTE — ED Notes (Signed)
Sodium lab collect delayed due to EEG in room. Phlebotomy to collect.

## 2019-04-22 NOTE — ED Notes (Signed)
Admitting at bedside 

## 2019-04-23 ENCOUNTER — Inpatient Hospital Stay (HOSPITAL_COMMUNITY): Payer: Medicare Other

## 2019-04-23 LAB — BASIC METABOLIC PANEL
Anion gap: 10 (ref 5–15)
Anion gap: 9 (ref 5–15)
Anion gap: 9 (ref 5–15)
Anion gap: 10 (ref 5–15)
Anion gap: 11 (ref 5–15)
BUN: 16 mg/dL (ref 8–23)
BUN: 13 mg/dL (ref 8–23)
BUN: 13 mg/dL (ref 8–23)
BUN: 13 mg/dL (ref 8–23)
BUN: 15 mg/dL (ref 8–23)
CO2: 23 mmol/L (ref 22–32)
CO2: 24 mmol/L (ref 22–32)
CO2: 23 mmol/L (ref 22–32)
CO2: 22 mmol/L (ref 22–32)
CO2: 19 mmol/L — ABNORMAL LOW (ref 22–32)
Calcium: 8.5 mg/dL — ABNORMAL LOW (ref 8.9–10.3)
Calcium: 8.5 mg/dL — ABNORMAL LOW (ref 8.9–10.3)
Calcium: 8.6 mg/dL — ABNORMAL LOW (ref 8.9–10.3)
Calcium: 8.6 mg/dL — ABNORMAL LOW (ref 8.9–10.3)
Calcium: 8.6 mg/dL — ABNORMAL LOW (ref 8.9–10.3)
Chloride: 105 mmol/L (ref 98–111)
Chloride: 108 mmol/L (ref 98–111)
Chloride: 111 mmol/L (ref 98–111)
Chloride: 108 mmol/L (ref 98–111)
Chloride: 103 mmol/L (ref 98–111)
Creatinine, Ser: 1.24 mg/dL (ref 0.61–1.24)
Creatinine, Ser: 1.03 mg/dL (ref 0.61–1.24)
Creatinine, Ser: 1.09 mg/dL (ref 0.61–1.24)
Creatinine, Ser: 1.1 mg/dL (ref 0.61–1.24)
Creatinine, Ser: 0.97 mg/dL (ref 0.61–1.24)
GFR calc Af Amer: >60 mL/min (ref >60)
GFR calc Af Amer: >60 mL/min (ref >60)
GFR calc Af Amer: >60 mL/min (ref >60)
GFR calc Af Amer: >60 mL/min (ref >60)
GFR calc Af Amer: >60 mL/min (ref >60)
GFR calc non Af Amer: 59 mL/min — ABNORMAL LOW (ref >60)
GFR calc non Af Amer: >60 mL/min (ref >60)
GFR calc non Af Amer: >60 mL/min (ref >60)
GFR calc non Af Amer: >60 mL/min (ref >60)
GFR calc non Af Amer: >60 mL/min (ref >60)
Glucose, Bld: 128 mg/dL — ABNORMAL HIGH (ref 70–99)
Glucose, Bld: 111 mg/dL — ABNORMAL HIGH (ref 70–99)
Glucose, Bld: 110 mg/dL — ABNORMAL HIGH (ref 70–99)
Glucose, Bld: 129 mg/dL — ABNORMAL HIGH (ref 70–99)
Glucose, Bld: 125 mg/dL — ABNORMAL HIGH (ref 70–99)
Potassium: 5.2 mmol/L — ABNORMAL HIGH (ref 3.5–5.1)
Potassium: 4.3 mmol/L (ref 3.5–5.1)
Potassium: 4.4 mmol/L (ref 3.5–5.1)
Potassium: 4.5 mmol/L (ref 3.5–5.1)
Potassium: 5.8 mmol/L — ABNORMAL HIGH (ref 3.5–5.1)
Sodium: 138 mmol/L (ref 135–145)
Sodium: 141 mmol/L (ref 135–145)
Sodium: 143 mmol/L (ref 135–145)
Sodium: 140 mmol/L (ref 135–145)
Sodium: 133 mmol/L — ABNORMAL LOW (ref 135–145)

## 2019-04-23 LAB — CBC
HCT: 40.3 % (ref 39.0–52.0)
HCT: 40.1 % (ref 39.0–52.0)
Hemoglobin: 12.8 g/dL — ABNORMAL LOW (ref 13.0–17.0)
Hemoglobin: 13.3 g/dL (ref 13.0–17.0)
MCH: 31.6 pg (ref 26.0–34.0)
MCH: 32 pg (ref 26.0–34.0)
MCHC: 31.8 g/dL (ref 30.0–36.0)
MCHC: 33.2 g/dL (ref 30.0–36.0)
MCV: 99.5 fL (ref 80.0–100.0)
MCV: 96.6 fL (ref 80.0–100.0)
Platelets: 396 10*3/uL (ref 150–400)
Platelets: 398 10*3/uL (ref 150–400)
RBC: 4.05 MIL/uL — ABNORMAL LOW (ref 4.22–5.81)
RBC: 4.15 MIL/uL — ABNORMAL LOW (ref 4.22–5.81)
RDW: 14.7 % (ref 11.5–15.5)
RDW: 14.6 % (ref 11.5–15.5)
WBC: 7.1 10*3/uL (ref 4.0–10.5)
WBC: 7.5 10*3/uL (ref 4.0–10.5)
nRBC: 0 % (ref 0.0–0.2)
nRBC: 0 % (ref 0.0–0.2)

## 2019-04-23 LAB — URINALYSIS, COMPLETE (UACMP) WITH MICROSCOPIC
Bacteria, UA: NONE SEEN
Bilirubin Urine: NEGATIVE
Glucose, UA: NEGATIVE mg/dL
Hgb urine dipstick: NEGATIVE
Ketones, ur: NEGATIVE mg/dL
Leukocytes,Ua: NEGATIVE
Nitrite: NEGATIVE
Protein, ur: NEGATIVE mg/dL
Specific Gravity, Urine: 1.014 (ref 1.005–1.030)
pH: 7 (ref 5.0–8.0)

## 2019-04-23 LAB — SODIUM: Sodium: 141 mmol/L (ref 135–145)

## 2019-04-23 LAB — PHOSPHORUS
Phosphorus: 2.6 mg/dL (ref 2.5–4.6)
Phosphorus: 2.5 mg/dL (ref 2.5–4.6)

## 2019-04-23 LAB — MAGNESIUM
Magnesium: 2.3 mg/dL (ref 1.7–2.4)
Magnesium: 2.4 mg/dL (ref 1.7–2.4)

## 2019-04-23 LAB — MRSA PCR SCREENING: MRSA by PCR: NEGATIVE

## 2019-04-23 LAB — TSH: TSH: 0.739 u[IU]/mL (ref 0.350–4.500)

## 2019-04-23 LAB — GLUCOSE, CAPILLARY: Glucose-Capillary: 100 mg/dL — ABNORMAL HIGH (ref 70–99)

## 2019-04-23 LAB — T4, FREE: Free T4: 0.6 ng/dL — ABNORMAL LOW (ref 0.61–1.12)

## 2019-04-23 MED ORDER — CHLORHEXIDINE GLUCONATE CLOTH 2 % EX PADS
6.0000 | MEDICATED_PAD | Freq: Every day | CUTANEOUS | Status: DC
  Administered 2019-04-23 – 2019-04-25 (×3): 6 via TOPICAL

## 2019-04-23 MED ORDER — SODIUM CHLORIDE 0.9% FLUSH: 10.0000 mL | INTRAVENOUS | Status: DC | PRN

## 2019-04-23 MED ORDER — SODIUM CHLORIDE 0.9% FLUSH
10.0000 mL | Freq: Two times a day (BID) | INTRAVENOUS | Status: DC
  Administered 2019-04-23 – 2019-04-24 (×3): 10 mL
  Administered 2019-04-24: 22:00:00 20 mL
  Administered 2019-04-25: 10 mL

## 2019-04-23 MED ORDER — SODIUM CHLORIDE 3 % IV SOLN
INTRAVENOUS | Status: DC
  Administered 2019-04-23: 06:00:00 50 mL/h via INTRAVENOUS
  Filled 2019-04-23: qty 500

## 2019-04-23 MED ORDER — ORAL CARE MOUTH RINSE
15.0000 mL | Freq: Two times a day (BID) | OROMUCOSAL | Status: DC
  Administered 2019-04-23 – 2019-04-25 (×4): 15 mL via OROMUCOSAL

## 2019-04-23 NOTE — Progress Notes (Signed)
Neurosurgery Service Progress Note  Subjective: No acute events overnight, pt denies any repeat episodes of issues w/ the L side   Objective: Vitals:   04/23/19 0700 04/23/19 0800 04/23/19 0821 04/23/19 0900  BP: 137/77 (!) 152/82  140/78  Pulse: 70 74  72  Resp: 12 11  16   Temp:   98.3 F (36.8 C)   TempSrc:   Oral   SpO2: 97% 97%  96%  Weight:      Height:       Temp (24hrs), Avg:98.2 F (36.8 C), Min:97 F (36.1 C), Max:99 F (37.2 C)  CBC Latest Ref Rng & Units 04/23/2019 04/23/2019 04/22/2019  WBC 4.0 - 10.5 K/uL 7.5 7.1 -  Hemoglobin 13.0 - 17.0 g/dL 13.3 12.8(L) 13.6  Hematocrit 39.0 - 52.0 % 40.1 40.3 40.0  Platelets 150 - 400 K/uL 398 396 -   BMP Latest Ref Rng & Units 04/23/2019 04/23/2019 04/23/2019  Glucose 70 - 99 mg/dL 111(H) - 128(H)  BUN 8 - 23 mg/dL 13 - 16  Creatinine 0.61 - 1.24 mg/dL 1.03 - 1.24  BUN/Creat Ratio 6 - 22 (calc) - - -  Sodium 135 - 145 mmol/L 141 141 138  Potassium 3.5 - 5.1 mmol/L 4.3 - 5.2(H)  Chloride 98 - 111 mmol/L 108 - 105  CO2 22 - 32 mmol/L 24 - 23  Calcium 8.9 - 10.3 mg/dL 8.5(L) - 8.5(L)    Intake/Output Summary (Last 24 hours) at 04/23/2019 0937 Last data filed at 04/23/2019 0800 Gross per 24 hour  Intake 515.95 ml  Output 510 ml  Net 5.95 ml    Current Facility-Administered Medications:  .  acetaminophen (TYLENOL) tablet 650 mg, 650 mg, Oral, Q6H PRN, Tyna Jaksch, MD .  Chlorhexidine Gluconate Cloth 2 % PADS 6 each, 6 each, Topical, Daily, Tyna Jaksch, MD .  labetalol (NORMODYNE) injection 10 mg, 10 mg, Intravenous, Q2H PRN, Tyna Jaksch, MD .  levETIRAcetam (KEPPRA) IVPB 500 mg/100 mL premix, 500 mg, Intravenous, Q12H, Amie Portland, MD, Last Rate: 400 mL/hr at 04/23/19 0800, Rate Verify at 04/23/19 0800 .  lidocaine (PF) (XYLOCAINE) 1 % injection 5 mL, 5 mL, Intradermal, Once, Carmin Muskrat, MD .  MEDLINE mouth rinse, 15 mL, Mouth Rinse, BID, Tyna Jaksch, MD .  morphine 2 MG/ML injection  2 mg, 2 mg, Intravenous, Q4H PRN, Tyna Jaksch, MD .  sodium chloride (hypertonic) 3 % solution, , Intravenous, Continuous, Anders Simmonds, MD, Last Rate: 50 mL/hr at 04/23/19 0800, Rate Verify at 04/23/19 0800 .  sodium chloride flush (NS) 0.9 % injection 3 mL, 3 mL, Intravenous, Once, Carmin Muskrat, MD   Physical Exam: AOx3, PERRL, b/l XT, FS, Strength 5/5 x4, SILTx4, no drift  Assessment & Plan: 69 y.o. man s/p paroxysmal / self-resolved L sided weakness, CTH w/ L subacute SDH.  -while rounding this morning, pt's RN informed me that his contralateral PIV also infiltrated w/ 3%. From my perspective, I do not have any concern for increased intracranial pressure, when I saw him this morning he had normal neurovascular function in the distal BUE, no s/sx of compartment syndrome at this time.  -Sx self-limited and no longer present, I do not think this is a structural issue related to the SDH. He does not / did not have an exam c/w a Kernohan's phenomenon, which leaves little anatomic (vs electrophysiologic) explanation for his L SDH causing his L sided Sx, especially since they resolved without intervention. Additionally, he seems cognitively  more normal this morning, c/w a post-ictal period - less somnolent and more consistent answering of questions.   Judith Part  04/23/19 9:37 AM

## 2019-04-23 NOTE — ED Notes (Addendum)
Phlebotomy unable to collect lab specimens.

## 2019-04-23 NOTE — Procedures (Signed)
Patient Name: JAKARIE ACKERMANN  MRN: [id number]  Epilepsy Attending: Lora Havens  Referring Physician/Provider: Dr Amie Portland Date: 04/22/2019 Duration: 24.38 mins  Patient history: 69 year old man who presented with symptoms of left-sided weakness and noted to have a left-sided subdural hematoma. EEG to evaluate for seizure  Level of alertness: awake  AEDs during EEG study: LEV  Technical aspects: This EEG study was done with scalp electrodes positioned according to the 10-20 International system of electrode placement. Electrical activity was acquired at a sampling rate of 500Hz  and reviewed with a high frequency filter of 70Hz  and a low frequency filter of 1Hz . EEG data were recorded continuously and digitally stored.   DESCRIPTION: No clear posterior dominant rhythm was seen. EEG showed continuous generalized 6-8Hz  theta slowing. Hyperventilation and photic stimulation were not performed.  ABNORMALITY - Continuous slow, generalized  IMPRESSION: This study is suggestive of moderate diffuse encephalopathy, non specific to etiology.  No seizures or epileptiform discharges were seen throughout the recording.  Daeshon Grammatico Barbra Sarks

## 2019-04-23 NOTE — ED Notes (Signed)
Report given to 76M RN, bed still not ready request made by RN to get pt up between 3:15 - 3:30.

## 2019-04-23 NOTE — Procedures (Addendum)
Patient Name: Ricky Ward  MRN: [id number]  Epilepsy Attending: Lora Havens  Referring Physician/Provider: Dr Kerney Elbe Date: 04/23/2019 Duration: 23.29 mins  Patient history: 69 year old man who presented with symptoms of left-sided weakness and noted to have a left-sided subdural hematoma. EEG to evaluate for seizure  Level of alertness: awake  AEDs during EEG study: LEV  Technical aspects: This EEG study was done with scalp electrodes positioned according to the 10-20 International system of electrode placement. Electrical activity was acquired at a sampling rate of 500Hz  and reviewed with a high frequency filter of 70Hz  and a low frequency filter of 1Hz . EEG data were recorded continuously and digitally stored.   DESCRIPTION: No clear posterior dominant rhythm was seen. EEG showed continuous generalized and maximal right frontotemporal region 6-8Hz  theta slowing. Hyperventilation and photic stimulation were not performed.  ABNORMALITY - Continuous slow, generalized and maximal right frontotemporal region  IMPRESSION: This study is suggestive of cortical dysfunction and right frontotemporal region, nonspecific to etiology as well as mild diffuse encephalopathy, non specific to etiology.  No seizures or epileptiform discharges were seen throughout the recording.  Lionel Woodberry Barbra Sarks

## 2019-04-23 NOTE — ED Notes (Signed)
Swelling still noted under ace bandage but area is noticeably decompressed. This RN reapplied ace bandage, will reassess swelling in 9min

## 2019-04-23 NOTE — ED Notes (Signed)
Right arm now equivalent in size to left arm. Will remove ace bandage in 25min

## 2019-04-23 NOTE — ED Notes (Signed)
Garlon Hatchet RN attempted to draw labs due at 2200, noticed that IV in right AC infiltrated hypertonic solution, unknown amt. This RN & Garlon Hatchet RN DC'd IV/solution immediately, IV started in left forearm by Garlon Hatchet RN and blood draw attempted unsuccessfully. Will request phlebotomy.

## 2019-04-23 NOTE — Evaluation (Signed)
Clinical/Bedside Swallow Evaluation Patient Details  Name: Ricky Ward MRN: [id number] Date of Birth: 07-29-1950  Today's Date: 04/23/2019 Time: SLP Start Time (ACUTE ONLY): 37 SLP Stop Time (ACUTE ONLY): 1715 SLP Time Calculation (min) (ACUTE ONLY): 12 min  Past Medical History:  Past Medical History:  Diagnosis Date  . Bipolar 1 disorder ([place])   . Constipation   . Hypercholesterolemia   . Psoriasis   . Thyroid disease    Past Surgical History:  Past Surgical History:  Procedure Laterality Date  . FOOT SURGERY  08/23/2003  . WRIST FRACTURE SURGERY Left    HPI:  Pt is a 69 y.o. male who presented from assisted living after developing left sided weakness after fall. Presented as code stroke with EMS.  CT noted for large left hemispheric subdural hematoma with rightward midline shift of 7 mm. Some improvement in left sided weakness in ED. Swallow evaluation on 04/05/19 was unremarkable for s/sx of aspiration.   Assessment / Plan / Recommendation Clinical Impression  Pt was seen for bedside swallow evaluation and he denied a history of dysphagia. Oral mechanism exam was [facility] and pt was edentulous. He reported owning dentures but indicated that they are not currently at the hospital and that he frequently eats without them. He tolerated all solids and liquids without signs or symptoms of oropharyngeal dysphagia. Mastication time was functional despite edentulous status. A regular texture diet is recommended at this time. Considering his dental status SLP will see pt once more to ensure tolerance of the recommended diet; however, further SLP services will likely not be clinically indicated following that for swallowing.  SLP Visit Diagnosis: Dysphagia, unspecified (R13.10)    Aspiration Risk  No limitations    Diet Recommendation Regular;Thin liquid   Liquid Administration via: Cup;Straw Medication Administration: Whole meds with liquid Supervision: Patient able to self  feed Compensations: Slow rate Postural Changes: Seated upright at 90 degrees    Other  Recommendations Oral Care Recommendations: Oral care BID   Follow up Recommendations None      Frequency and Duration min 2x/week  1 week       Prognosis Prognosis for Safe Diet Advancement: Good      Swallow Study   General Date of Onset: 04/22/19 HPI: Pt is a 69 y.o. male who presented from assisted living after developing left sided weakness after fall. Presented as code stroke with EMS.  CT noted for large left hemispheric subdural hematoma with rightward midline shift of 7 mm. Some improvement in left sided weakness in ED. Swallow evaluation on 04/05/19 was unremarkable for s/sx of aspiration. Type of Study: Bedside Swallow Evaluation Previous Swallow Assessment: See HPI Diet Prior to this Study: NPO Temperature Spikes Noted: No Respiratory Status: Room air History of Recent Intubation: No Behavior/Cognition: Alert;Cooperative;Pleasant mood Oral Cavity Assessment: Within Functional Limits Oral Care Completed by SLP: No Oral Cavity - Dentition: Dentures, top;Edentulous Vision: Functional for self-feeding Self-Feeding Abilities: Able to feed self Patient Positioning: Upright in bed;Postural control adequate for testing Baseline Vocal Quality: Normal Volitional Cough: Strong Volitional Swallow: Able to elicit    Oral/Motor/Sensory Function Overall Oral Motor/Sensory Function: Within functional limits   Ice Chips Ice chips: Within functional limits Presentation: Spoon   Thin Liquid Thin Liquid: Within functional limits Presentation: Cup;Straw    Nectar Thick Nectar Thick Liquid: Not tested   Honey Thick Honey Thick Liquid: Not tested   Puree Puree: Within functional limits Presentation: Spoon   Solid    Yaeko Fazekas I. Hardin Negus, [place],  [place] Office number [phone] Pager [phone] Solid: Within functional limits Presentation: Self Fed       Horton Marshall 04/23/2019,5:31 PM

## 2019-04-23 NOTE — ED Notes (Signed)
Ace bandage removed, right arm consistent in size with left arm. Will continue to monitor pt and assess right arm

## 2019-04-23 NOTE — ED Notes (Signed)
Swelling continuing to decrease, will continue ace compression & to monitor pt's arm

## 2019-04-23 NOTE — ED Notes (Signed)
Ace bandage placed on infiltrated area to attempt to decompress swelling, will assess area q59min for decompression

## 2019-04-23 NOTE — Progress Notes (Signed)
Pt left PIV infiltrated with hypertonic saline. MD made aware, IV removed, ice applied, and extremity elevated per order. Will continue to monitor.

## 2019-04-23 NOTE — Progress Notes (Signed)
EEG completed, results pending. 

## 2019-04-23 NOTE — Progress Notes (Signed)
NAME:  Ricky Ward, MRN:  [id number], DOB:  Feb 11, 1951, LOS: 1 ADMISSION DATE:  04/22/2019, CONSULTATION DATE:  04/22/2019 REFERRING MD:  EDP, CHIEF COMPLAINT:  Altered mental status  Brief History   4 yoM presenting from assisted living after developing left sided weakness after fall. LKW 1815. Presented as code stroke with EMS. CT noted for large left hemispheric subdural hematoma with rightward midline shift of 7 mm. Also noted to have left finger laceration which was sutured in ER. Neurology and [facility] consulting. Started on hypertonic saline, protecting airway, remains hemodynamically stable, and some improvement in left sided weakness in ER. PCCM called for admit.   Consults:  Neurosurgery, neurology  Procedures:    Significant Diagnostic Tests:  CT head code stroke 3/10 1. Positive for left side Subdural Hematoma measuring up to 7 mm in thickness. Intracranial mass effect with rightward midline shift of 7 mm. 2. Underlying chronic anterior division Left MCA infarct. No acute cortically based infarct identified. 3. These results were communicated to Dr. Lorraine Lax at 7:13 pm on 04/22/2019 by text page via the [facility] messaging system.  Micro Data:    Antimicrobials:    Interim history/subjective:  No overnight issues.  Hemodynamically stable.  Pleasant cooperative.  Hypertonic saline infiltrated and IV in left arm this morning.  Objective   Blood pressure 137/76, pulse 85, temperature 98 F (36.7 C), temperature source Oral, resp. rate 17, height 5\' 7"  (1.702 m), weight 75.2 kg, SpO2 98 %.        Intake/Output Summary (Last 24 hours) at 04/23/2019 1543 Last data filed at 04/23/2019 1100 Gross per 24 hour  Intake 643.53 ml  Output 810 ml  Net -166.47 ml   Filed Weights   04/22/19 1900 04/23/19 0342  Weight: 76.6 kg 75.2 kg    Examination: General:  Alert and oriented x3, normal speech HENT: Redness on left side of head(chronic per patient) edentulous Lungs:  CTAB. No increased WOB Cardiovascular: RRR. Normal pulses Abdomen: soft non tender and non distended Extremities:  No edema Neuro:  A little confused, but has normal strength in bilateral upper and lower extremities.  No facial droop.  Resolved Hospital Problem list     Assessment & Plan:   Subdural hematoma Possible left MCA infarct  Reviewed neurology and neurosurgery recommendations.  We will stop hypertonic saline.  He is on Keppra.  Plan is for MRI brain.  There is no plans for neurosurgical intervention.  Patient is n.p.o. awaiting speech evaluation.  We will hold his home medications until then.  This point I think it is safe for him to transfer to the regular nursing floor preferably in neurologic floor.  I sent the patient out to [facility] hospitalist and they will assume care tomorrow 3/12.  Best practice:  Diet: npo Pain/Anxiety/Delirium protocol (if indicated): n/a VAP protocol (if indicated): n/a DVT prophylaxis: SCDs GI prophylaxis: n/a Glucose control: controlled Foley none Mobility: will need PT OT as well as SLP Code Status: Full Family Communication: Sister updated at the bedside Disposition: Okay for transfer to progressive  Labs   CBC: Recent Labs  Lab 04/22/19 1911 04/22/19 1924 04/23/19 0133 04/23/19 0753  WBC 6.4  --  7.1 7.5  NEUTROABS 3.0  --   --   --   HGB 13.0 13.6 12.8* 13.3  HCT 40.3 40.0 40.3 40.1  MCV 99.8  --  99.5 96.6  PLT 378  --  396 [id number]    Basic Metabolic Panel: Recent Labs  Lab  04/22/19 1911 04/22/19 1911 04/22/19 1924 04/22/19 2143 04/23/19 0133 04/23/19 0619 04/23/19 0753 04/23/19 1027 04/23/19 1428  NA 135   < > 137   < > 138 141 141 143 140  K 4.7   < > 4.7  --  5.2*  --  4.3 4.4 4.5  CL 100   < > 100  --  105  --  108 111 108  CO2 27  --   --   --  23  --  24 23 22   GLUCOSE 127*   < > 121*  --  128*  --  111* 110* 129*  BUN 18   < > 22  --  16  --  13 13 13   CREATININE 1.58*   < > 1.60*  --  1.24  --  1.03 1.09  1.10  CALCIUM 8.5*  --   --   --  8.5*  --  8.5* 8.6* 8.6*  MG  --   --   --   --  2.3  --  2.4  --   --   PHOS  --   --   --   --  2.6  --  2.5  --   --    < > = values in this interval not displayed.   GFR: Estimated Creatinine Clearance: 58.4 mL/min (by C-G formula based on SCr of 1.1 mg/dL). Recent Labs  Lab 04/22/19 1911 04/23/19 0133 04/23/19 0753  WBC 6.4 7.1 7.5    Liver Function Tests: Recent Labs  Lab 04/22/19 1911  AST 27  ALT 18  ALKPHOS 58  BILITOT 0.5  PROT 7.4  ALBUMIN 3.2*   No results for input(s): LIPASE, AMYLASE in the last 168 hours. No results for input(s): AMMONIA in the last 168 hours.  ABG    Component Value Date/Time   TCO2 28 04/22/2019 1924     Coagulation Profile: Recent Labs  Lab 04/22/19 1911  INR 1.0    Cardiac Enzymes: No results for input(s): CKTOTAL, CKMB, CKMBINDEX, TROPONINI in the last 168 hours.  HbA1C: No results found for: HGBA1C  CBG: Recent Labs  Lab 04/23/19 0352  GLUCAP 100*     [facility] 04/23/2019 3:43 PM  Pager: [phone] After hours pager: [phone]

## 2019-04-23 NOTE — Progress Notes (Signed)
Subjective: Left sided weakness is now resolved.   Objective: Current vital signs: BP 140/78   Pulse 72   Temp 98.3 F (36.8 C) (Oral)   Resp 16   Ht 5\' 7"  (1.702 m)   Wt 75.2 kg   SpO2 96%   BMI 25.97 kg/m  Vital signs in last 24 hours: Temp:  [97 F (36.1 C)-99 F (37.2 C)] 98.3 F (36.8 C) (03/11 0821) Pulse Rate:  [70-88] 72 (03/11 0900) Resp:  [10-24] 16 (03/11 0900) BP: (102-160)/(57-124) 140/78 (03/11 0900) SpO2:  [94 %-100 %] 96 % (03/11 0900) Weight:  [75.2 kg-76.6 kg] 75.2 kg (03/11 0342)  Intake/Output from previous day: 03/10 0701 - 03/11 0700 In: 367.6 [I.V.:268.2; IV Piggyback:99.4] Out: 310 [Urine:310] Intake/Output this shift: Total I/O In: 148.3 [I.V.:86.4; IV Piggyback:61.9] Out: 200 [Urine:200] Nutritional status:  Diet Order            Diet NPO time specified  Diet effective now             HEENT: Normocephalic Lungs: Respirations unlabored Ext: No edema  Neurologic Exam: Ment: Able to follow simple motor commands and answer basic questions. He is oriented to hospital, state, date, month and year, but not city. His speech is fluent but with decreased information content; also with some tangentiality. One instance of garbled/nonsensical verbal reply was noted. Decreased attention. Bright affect.  CN: Does not cooperate with testing of pupillary light reflexes, but left pupil appears about 1 mm larger than the right. Tracks to left and right. Exotropia noted. Face grossly symmetric.  Motor: LUE with 5/5 strength but decreased spontaneous movement compared to the right. RUE 5/5. BLE 5/5 Sensory: Intact to FT x 4 with no extinction to DSS.  Reflexes: 3+ bilateral patellae   Lab Results: Results for orders placed or performed during the hospital encounter of 04/22/19 (from the past 48 hour(s))  Protime-INR     Status: None   Collection Time: 04/22/19  7:11 PM  Result Value Ref Range   Prothrombin Time 13.1 11.4 - 15.2 seconds   INR 1.0 0.8 -  1.2    Comment: (NOTE) INR goal varies based on device and disease states. Performed at [facility], [place] [address] [place], [place] [address]   APTT     Status: None   Collection Time: 04/22/19  7:11 PM  Result Value Ref Range   aPTT 32 24 - 36 seconds    Comment: Performed at [facility] [address], [place], [place] [address]  CBC     Status: Abnormal   Collection Time: 04/22/19  7:11 PM  Result Value Ref Range   WBC 6.4 4.0 - 10.5 K/uL   RBC 4.04 (L) 4.22 - 5.81 MIL/uL   Hemoglobin 13.0 13.0 - 17.0 g/dL   HCT 40.3 39.0 - 52.0 %   MCV 99.8 80.0 - 100.0 fL   MCH 32.2 26.0 - 34.0 pg   MCHC 32.3 30.0 - 36.0 g/dL   RDW 14.8 11.5 - 15.5 %   Platelets 378 150 - 400 K/uL   nRBC 0.0 0.0 - 0.2 %    Comment: Performed at [facility], [place] [address], [place], [place] [address]  Differential     Status: None   Collection Time: 04/22/19  7:11 PM  Result Value Ref Range   Neutrophils Relative % 48 %   Neutro Abs 3.0 1.7 - 7.7 K/uL   Lymphocytes Relative 36 %  Lymphs Abs 2.3 0.7 - 4.0 K/uL   Monocytes Relative 13 %   Monocytes Absolute 0.9 0.1 - 1.0 K/uL   Eosinophils Relative 2 %   Eosinophils Absolute 0.1 0.0 - 0.5 K/uL   Basophils Relative 1 %   Basophils Absolute 0.1 0.0 - 0.1 K/uL   Immature Granulocytes 0 %   Abs Immature Granulocytes 0.02 0.00 - 0.07 K/uL    Comment: Performed at [facility], [place] [address], [place], [place] [address]  Comprehensive metabolic panel     Status: Abnormal   Collection Time: 04/22/19  7:11 PM  Result Value Ref Range   Sodium 135 135 - 145 mmol/L   Potassium 4.7 3.5 - 5.1 mmol/L   Chloride 100 98 - 111 mmol/L   CO2 27 22 - 32 mmol/L   Glucose, Bld 127 (H) 70 - 99 mg/dL    Comment: Glucose reference range applies only to samples taken after fasting for at least 8 hours.   BUN 18 8 - 23 mg/dL   Creatinine, Ser 1.58 (H) 0.61 - 1.24 mg/dL   Calcium 8.5 (L) 8.9 - 10.3 mg/dL   Total Protein 7.4 6.5 - 8.1 g/dL    Albumin 3.2 (L) 3.5 - 5.0 g/dL   AST 27 15 - 41 U/L   ALT 18 0 - 44 U/L   Alkaline Phosphatase 58 38 - 126 U/L   Total Bilirubin 0.5 0.3 - 1.2 mg/dL   GFR calc non Af Amer 44 (L) >60 mL/min   GFR calc Af Amer 51 (L) >60 mL/min   Anion gap 8 5 - 15    Comment: Performed at [facility] [address], [place], [place] [address]  I-stat chem 8, ED     Status: Abnormal   Collection Time: 04/22/19  7:24 PM  Result Value Ref Range   Sodium 137 135 - 145 mmol/L   Potassium 4.7 3.5 - 5.1 mmol/L   Chloride 100 98 - 111 mmol/L   BUN 22 8 - 23 mg/dL   Creatinine, Ser 1.60 (H) 0.61 - 1.24 mg/dL   Glucose, Bld 121 (H) 70 - 99 mg/dL    Comment: Glucose reference range applies only to samples taken after fasting for at least 8 hours.   Calcium, Ion 1.00 (L) 1.15 - 1.40 mmol/L   TCO2 28 22 - 32 mmol/L   Hemoglobin 13.6 13.0 - 17.0 g/dL   HCT 40.0 39.0 - 52.0 %  Sodium     Status: None   Collection Time: 04/22/19  9:43 PM  Result Value Ref Range   Sodium 136 135 - 145 mmol/L    Comment: Performed at [facility] [address] [place],  [address]  Respiratory Panel by RT PCR (Flu A&B, Covid) - Nasopharyngeal Swab     Status: None   Collection Time: 04/22/19 10:06 PM   Specimen: Nasopharyngeal Swab  Result Value Ref Range   SARS Coronavirus 2 by RT PCR NEGATIVE NEGATIVE    Comment: (NOTE) SARS-CoV-2 target nucleic acids are NOT DETECTED. The SARS-CoV-2 RNA is generally detectable in upper respiratoy specimens during the acute phase of infection. The lowest concentration of SARS-CoV-2 viral copies this assay can detect is 131 copies/mL. A negative result does not preclude SARS-Cov-2 infection and should not be used as the sole basis for treatment or other patient management decisions. A negative result may occur with  improper specimen collection/handling, submission of specimen other than nasopharyngeal swab, presence of  viral mutation(s) within the areas targeted by  this assay, and inadequate number of viral copies (<131 copies/mL). A negative result must be combined with clinical observations, patient history, and epidemiological information. The expected result is Negative. Fact Sheet for Patients:  PinkCheek.be Fact Sheet for Healthcare Providers:  GravelBags.it This test is not yet ap proved or cleared by the [place] FDA and  has been authorized for detection and/or diagnosis of SARS-CoV-2 by FDA under an Emergency Use Authorization (EUA). This EUA will remain  in effect (meaning this test can be used) for the duration of the COVID-19 declaration under Section 564(b)(1) of the Act, 21 U.S.C. section 360bbb-3(b)(1), unless the authorization is terminated or revoked sooner.    Influenza A by PCR NEGATIVE NEGATIVE   Influenza B by PCR NEGATIVE NEGATIVE    Comment: (NOTE) The Xpert Xpress SARS-CoV-2/FLU/RSV assay is intended as an aid in  the diagnosis of influenza from Nasopharyngeal swab specimens and  should not be used as a sole basis for treatment. Nasal washings and  aspirates are unacceptable for Xpert Xpress SARS-CoV-2/FLU/RSV  testing. Fact Sheet for Patients: PinkCheek.be Fact Sheet for Healthcare Providers: GravelBags.it This test is not yet approved or cleared by the [place] FDA and  has been authorized for detection and/or diagnosis of SARS-CoV-2 by  FDA under an Emergency Use Authorization (EUA). This EUA will remain  in effect (meaning this test can be used) for the duration of the  Covid-19 declaration under Section 564(b)(1) of the Act, 21  U.S.C. section 360bbb-3(b)(1), unless the authorization is  terminated or revoked. Performed at [facility], [place] [address], [place], [place] [id number]   Basic metabolic panel     Status: Abnormal   Collection Time: 04/23/19  1:33 AM  Result Value Ref  Range   Sodium 138 135 - 145 mmol/L   Potassium 5.2 (H) 3.5 - 5.1 mmol/L   Chloride 105 98 - 111 mmol/L   CO2 23 22 - 32 mmol/L   Glucose, Bld 128 (H) 70 - 99 mg/dL    Comment: Glucose reference range applies only to samples taken after fasting for at least 8 hours.   BUN 16 8 - 23 mg/dL   Creatinine, Ser 1.24 0.61 - 1.24 mg/dL   Calcium 8.5 (L) 8.9 - 10.3 mg/dL   GFR calc non Af Amer 59 (L) >60 mL/min   GFR calc Af Amer >60 >60 mL/min   Anion gap 10 5 - 15    Comment: Performed at [facility] [address], [place], [place] [address]  Magnesium     Status: None   Collection Time: 04/23/19  1:33 AM  Result Value Ref Range   Magnesium 2.3 1.7 - 2.4 mg/dL    Comment: Performed at [facility] [address], [place], [place] [address]  Phosphorus     Status: None   Collection Time: 04/23/19  1:33 AM  Result Value Ref Range   Phosphorus 2.6 2.5 - 4.6 mg/dL    Comment: Performed at [facility] [address], [place] [address]  CBC     Status: Abnormal   Collection Time: 04/23/19  1:33 AM  Result Value Ref Range   WBC 7.1 4.0 - 10.5 K/uL   RBC 4.05 (L) 4.22 - 5.81 MIL/uL   Hemoglobin 12.8 (L) 13.0 - 17.0 g/dL   HCT 40.3 39.0 - 52.0 %   MCV 99.5 80.0 - 100.0 fL  MCH 31.6 26.0 - 34.0 pg   MCHC 31.8 30.0 - 36.0 g/dL   RDW 14.7 11.5 - 15.5 %   Platelets 396 150 - 400 K/uL   nRBC 0.0 0.0 - 0.2 %    Comment: Performed at [facility], [place] [address] [place], [place] [address]  TSH     Status: None   Collection Time: 04/23/19  1:33 AM  Result Value Ref Range   TSH 0.739 0.350 - 4.500 uIU/mL    Comment: Performed by a 3rd Generation assay with a functional sensitivity of <=0.01 uIU/mL. Performed at [facility], [place] [address], [place], [place] [address]   T4, free     Status: Abnormal   Collection Time: 04/23/19  1:34 AM  Result Value Ref Range   Free T4 0.60 (L) 0.61 - 1.12 ng/dL    Comment: (NOTE) Biotin ingestion may interfere  with free T4 tests. If the results are inconsistent with the TSH level, previous test results, or the clinical presentation, then consider biotin interference. If needed, order repeat testing after stopping biotin. Performed at [facility], [place] [address], [place], [place] [address]   MRSA PCR Screening     Status: None   Collection Time: 04/23/19  3:42 AM   Specimen: Nasal Mucosa; Nasopharyngeal  Result Value Ref Range   MRSA by PCR NEGATIVE NEGATIVE    Comment:        The GeneXpert MRSA Assay (FDA approved for NASAL specimens only), is one component of a comprehensive MRSA colonization surveillance program. It is not intended to diagnose MRSA infection nor to guide or monitor treatment for MRSA infections. Performed at [facility], [place] [address] [place], [place] [address]   Glucose, capillary     Status: Abnormal   Collection Time: 04/23/19  3:52 AM  Result Value Ref Range   Glucose-Capillary 100 (H) 70 - 99 mg/dL    Comment: Glucose reference range applies only to samples taken after fasting for at least 8 hours.  Sodium     Status: None   Collection Time: 04/23/19  6:19 AM  Result Value Ref Range   Sodium 141 135 - 145 mmol/L    Comment: Performed at [facility] [address] [place], [place] [id number]  Basic metabolic panel     Status: Abnormal   Collection Time: 04/23/19  7:53 AM  Result Value Ref Range   Sodium 141 135 - 145 mmol/L   Potassium 4.3 3.5 - 5.1 mmol/L   Chloride 108 98 - 111 mmol/L   CO2 24 22 - 32 mmol/L   Glucose, Bld 111 (H) 70 - 99 mg/dL    Comment: Glucose reference range applies only to samples taken after fasting for at least 8 hours.   BUN 13 8 - 23 mg/dL   Creatinine, Ser 1.03 0.61 - 1.24 mg/dL   Calcium 8.5 (L) 8.9 - 10.3 mg/dL   GFR calc non Af Amer >60 >60 mL/min   GFR calc Af Amer >60 >60 mL/min   Anion gap 9 5 - 15    Comment: Performed at [facility] [address] [place], [place] [address]   Magnesium     Status: None   Collection Time: 04/23/19  7:53 AM  Result Value Ref Range   Magnesium 2.4 1.7 - 2.4 mg/dL    Comment: Performed at [facility] [address], [place], [place] [address]  Phosphorus  Status: None   Collection Time: 04/23/19  7:53 AM  Result Value Ref Range   Phosphorus 2.5 2.5 - 4.6 mg/dL    Comment: Performed at [facility], [place] [address] [place], [place] [address]  CBC     Status: Abnormal   Collection Time: 04/23/19  7:53 AM  Result Value Ref Range   WBC 7.5 4.0 - 10.5 K/uL   RBC 4.15 (L) 4.22 - 5.81 MIL/uL   Hemoglobin 13.3 13.0 - 17.0 g/dL   HCT 40.1 39.0 - 52.0 %   MCV 96.6 80.0 - 100.0 fL   MCH 32.0 26.0 - 34.0 pg   MCHC 33.2 30.0 - 36.0 g/dL   RDW 14.6 11.5 - 15.5 %   Platelets 398 150 - 400 K/uL   nRBC 0.0 0.0 - 0.2 %    Comment: Performed at [facility], [place] [address], [place], [place] [address]    Recent Results (from the past 240 hour(s))  Respiratory Panel by RT PCR (Flu A&B, Covid) - Nasopharyngeal Swab     Status: None   Collection Time: 04/22/19 10:06 PM   Specimen: Nasopharyngeal Swab  Result Value Ref Range Status   SARS Coronavirus 2 by RT PCR NEGATIVE NEGATIVE Final    Comment: (NOTE) SARS-CoV-2 target nucleic acids are NOT DETECTED. The SARS-CoV-2 RNA is generally detectable in upper respiratoy specimens during the acute phase of infection. The lowest concentration of SARS-CoV-2 viral copies this assay can detect is 131 copies/mL. A negative result does not preclude SARS-Cov-2 infection and should not be used as the sole basis for treatment or other patient management decisions. A negative result may occur with  improper specimen collection/handling, submission of specimen other than nasopharyngeal swab, presence of viral mutation(s) within the areas targeted by this assay, and inadequate number of viral copies (<131 copies/mL). A negative result must be combined with clinical observations,  patient history, and epidemiological information. The expected result is Negative. Fact Sheet for Patients:  PinkCheek.be Fact Sheet for Healthcare Providers:  GravelBags.it This test is not yet ap proved or cleared by the [place] FDA and  has been authorized for detection and/or diagnosis of SARS-CoV-2 by FDA under an Emergency Use Authorization (EUA). This EUA will remain  in effect (meaning this test can be used) for the duration of the COVID-19 declaration under Section 564(b)(1) of the Act, 21 U.S.C. section 360bbb-3(b)(1), unless the authorization is terminated or revoked sooner.    Influenza A by PCR NEGATIVE NEGATIVE Final   Influenza B by PCR NEGATIVE NEGATIVE Final    Comment: (NOTE) The Xpert Xpress SARS-CoV-2/FLU/RSV assay is intended as an aid in  the diagnosis of influenza from Nasopharyngeal swab specimens and  should not be used as a sole basis for treatment. Nasal washings and  aspirates are unacceptable for Xpert Xpress SARS-CoV-2/FLU/RSV  testing. Fact Sheet for Patients: PinkCheek.be Fact Sheet for Healthcare Providers: GravelBags.it This test is not yet approved or cleared by the [place] FDA and  has been authorized for detection and/or diagnosis of SARS-CoV-2 by  FDA under an Emergency Use Authorization (EUA). This EUA will remain  in effect (meaning this test can be used) for the duration of the  Covid-19 declaration under Section 564(b)(1) of the Act, 21  U.S.C. section 360bbb-3(b)(1), unless the authorization is  terminated or revoked. Performed at [facility], [place] [address] [place], [place] [address]   MRSA PCR Screening     Status: None  Collection Time: 04/23/19  3:42 AM   Specimen: Nasal Mucosa; Nasopharyngeal  Result Value Ref Range Status   MRSA by PCR NEGATIVE NEGATIVE Final    Comment:        The GeneXpert  MRSA Assay (FDA approved for NASAL specimens only), is one component of a comprehensive MRSA colonization surveillance program. It is not intended to diagnose MRSA infection nor to guide or monitor treatment for MRSA infections. Performed at [facility], [place] [address] [place], [place] [address]     Lipid Panel No results for input(s): CHOL, TRIG, HDL, CHOLHDL, VLDL, LDLCALC in the last 72 hours.  Studies/Results: EEG  Result Date: 04/23/2019 Ricky Havens, MD     04/23/2019  9:57 AM Patient Name: LOAL BERWANGER MRN: [id number] Epilepsy Attending: Lora Ward Referring Physician/Provider: Dr Kerney Elbe Date: 04/23/2019 Duration: 24.38 mins Patient history: 69 year old man who presented with symptoms of left-sided weakness and noted to have a left-sided subdural hematoma. EEG to evaluate for seizure Level of alertness: awake AEDs during EEG study: LEV Technical aspects: This EEG study was done with scalp electrodes positioned according to the 10-20 International system of electrode placement. Electrical activity was acquired at a sampling rate of 500Hz  and reviewed with a high frequency filter of 70Hz  and a low frequency filter of 1Hz . EEG data were recorded continuously and digitally stored. DESCRIPTION: No clear posterior dominant rhythm was seen. EEG showed continuous generalized 6-8Hz  theta slowing. Hyperventilation and photic stimulation were not performed. ABNORMALITY - Continuous slow, generalized IMPRESSION: This study is suggestive of moderate diffuse encephalopathy, non specific to etiology. No seizures or epileptiform discharges were seen throughout the recording. Ricky Ward   DG Hand 2 View Left  Result Date: 04/22/2019 CLINICAL DATA:  Fall with injury to the fingers EXAM: LEFT HAND - 2 VIEW COMPARISON:  None. FINDINGS: No fracture or malalignment. Surgical plate and fixating screws in the distal radius. IMPRESSION: No acute osseous abnormality Electronically  Signed   By: Donavan Foil M.D.   On: 04/22/2019 21:39   CT HEAD CODE STROKE WO CONTRAST  Addendum Date: 04/22/2019   ADDENDUM REPORT: 04/22/2019 19:21 ADDENDUM: Study discussed by telephone with Dr. Rory Percy on 04/22/2019 at 1916 hours. Electronically Signed   By: Genevie Ann M.D.   On: 04/22/2019 19:21   Result Date: 04/22/2019 CLINICAL DATA:  Code stroke. 69 year old male with left side weakness and slurred speech. EXAM: CT HEAD WITHOUT CONTRAST TECHNIQUE: Contiguous axial images were obtained from the base of the skull through the vertex without intravenous contrast. COMPARISON:  Report of [facility] noncontrast head CT 04/20/2013 (no images available). FINDINGS: Brain: Left side mixed density but mostly hypodense subdural hematoma throughout much of the hemisphere measuring up to 7 mm in thickness (series 2, image 23 and coronal image 34). Small areas of hyperdense blood products within the subdural collection. Intracranial mass effect with rightward midline shift of 6-7 mm. Mild mass effect on the left lateral ventricle. No ventriculomegaly. Basilar cisterns remain patent. Superimposed chronic appearing cortical encephalomalacia affecting the anterior left insula and inferior frontal gyrus (coronal image 26). Patchy hemisphere white matter hypodensity elsewhere. No other intracranial hemorrhage identified. No superimposed cortically based acute infarct identified. Vascular: Calcified atherosclerosis at the skull base. No suspicious intracranial vascular hyperdensity. Skull: No skull fracture identified. No acute osseous abnormality identified. Sinuses/Orbits: Visualized paranasal sinuses and mastoids are clear. Other: Disconjugate gaze. No acute orbit or scalp soft tissue finding.  ASPECTS [facility] Stroke Program Early CT Score) Total score (0-10 with 10 being normal): 10 (chronic left MCA infarct). IMPRESSION: 1. Positive for left side Subdural Hematoma measuring up to 7  mm in thickness. Intracranial mass effect with rightward midline shift of 7 mm. 2. Underlying chronic anterior division Left MCA infarct. No acute cortically based infarct identified. 3. These results were communicated to Dr. Lorraine Lax at 7:13 pm on 04/22/2019 by text page via the [facility] messaging system. Electronically Signed: By: Genevie Ann M.D. On: 04/22/2019 19:15   [place] EKG SITE RITE  Result Date: 04/22/2019 If Site Rite image not attached, placement could not be confirmed due to current cardiac rhythm.   Medications:  Scheduled: . Chlorhexidine Gluconate Cloth  6 each Topical Daily  . lidocaine (PF)  5 mL Intradermal Once  . mouth rinse  15 mL Mouth Rinse BID  . sodium chloride flush  3 mL Intravenous Once   Continuous: . levETIRAcetam 400 mL/hr at 04/23/19 0800  . sodium chloride (hypertonic) 50 mL/hr at 04/23/19 0800   EEG (Wednesday 3/10):  This study is suggestive of moderate diffuse encephalopathy, non specific to etiology. No seizures or epileptiform discharges were seen throughout the recording.  Assessment: 69 year old male with PMHx of HLD, hypothyroidism and bipolar disorder who presented with symptoms of left-sided weakness. CT revealed a left-sided subdural hematoma.  1. Left sided weakness now resolved except for somewhat decreased spontaneous movement on that side. EEG was negative yesterday, but does not rule out the possibility that he had a seizure with post-ictal Todd's paresis. A small right hemisphere stroke is also possible. The left sided subdural CT findings do not explain his presentation - not c/w a Kernohan's phenomenon per Neurosurgery.  2. On initial ED exam as well as today, he has a component of aphasia. This is most likely secondary to the old left frontal lobe stroke seen on CT, which overlaps the frontal operculum. 3. Not currently an operative candidate per Neurosurgery.  4. Will require an MRI to make sure there is no right-sided ischemic stroke.     Recommendations: -- Can discontinue 3% saline -- BP management  -- Continue Keppra 500 mg IV BID -- MRI brain without contrast.  -- Restart his home Depakote (most likely was for mood stabilization)    LOS: 1 day   @Electronically  signed: Dr. Kerney Elbe 04/23/2019  10:21 AM

## 2019-04-24 ENCOUNTER — Inpatient Hospital Stay (HOSPITAL_COMMUNITY): Payer: Medicare Other

## 2019-04-24 ENCOUNTER — Encounter (HOSPITAL_COMMUNITY): Payer: Self-pay | Admitting: Pulmonary Disease

## 2019-04-24 DIAGNOSIS — S065X9A Traumatic subdural hemorrhage with loss of consciousness of unspecified duration, initial encounter: Principal | ICD-10-CM

## 2019-04-24 DIAGNOSIS — I6389 Other cerebral infarction: Secondary | ICD-10-CM

## 2019-04-24 DIAGNOSIS — F319 Bipolar disorder, unspecified: Secondary | ICD-10-CM

## 2019-04-24 DIAGNOSIS — I639 Cerebral infarction, unspecified: Secondary | ICD-10-CM

## 2019-04-24 LAB — HEMOGLOBIN A1C
Hgb A1c MFr Bld: 5.8 % — ABNORMAL HIGH (ref 4.8–5.6)
Mean Plasma Glucose: 119.76 mg/dL

## 2019-04-24 LAB — BASIC METABOLIC PANEL
Anion gap: 12 (ref 5–15)
BUN: 12 mg/dL (ref 8–23)
CO2: 23 mmol/L (ref 22–32)
Calcium: 8.9 mg/dL (ref 8.9–10.3)
Chloride: 103 mmol/L (ref 98–111)
Creatinine, Ser: 1.09 mg/dL (ref 0.61–1.24)
GFR calc Af Amer: >60 mL/min (ref >60)
GFR calc non Af Amer: >60 mL/min (ref >60)
Glucose, Bld: 111 mg/dL — ABNORMAL HIGH (ref 70–99)
Potassium: 4 mmol/L (ref 3.5–5.1)
Sodium: 138 mmol/L (ref 135–145)

## 2019-04-24 LAB — LIPID PANEL
Cholesterol: 128 mg/dL (ref 0–200)
HDL: 32 mg/dL — ABNORMAL LOW (ref >40)
LDL Cholesterol: 76 mg/dL (ref 0–99)
Total CHOL/HDL Ratio: 4 RATIO
Triglycerides: 100 mg/dL (ref <150)
VLDL: 20 mg/dL (ref 0–40)

## 2019-04-24 LAB — RAPID URINE DRUG SCREEN, HOSP PERFORMED
Amphetamines: NOT DETECTED
Barbiturates: NOT DETECTED
Benzodiazepines: NOT DETECTED
Cocaine: NOT DETECTED
Opiates: NOT DETECTED
Tetrahydrocannabinol: POSITIVE — AB

## 2019-04-24 LAB — ECHOCARDIOGRAM COMPLETE
Height: 67 in
Weight: 2712.54 oz

## 2019-04-24 MED ORDER — SODIUM CHLORIDE 0.9 % IV SOLN
INTRAVENOUS | Status: DC | PRN
  Administered 2019-04-24: 250 mL via INTRAVENOUS

## 2019-04-24 MED ORDER — DRONABINOL 2.5 MG PO CAPS
2.5000 mg | ORAL_CAPSULE | Freq: Two times a day (BID) | ORAL | Status: DC
  Administered 2019-04-24 – 2019-04-25 (×3): 2.5 mg via ORAL
  Filled 2019-04-24 (×3): qty 1

## 2019-04-24 MED ORDER — LEVETIRACETAM 500 MG PO TABS: 500.0000 mg | ORAL_TABLET | Freq: Two times a day (BID) | ORAL | Status: AC

## 2019-04-24 MED ORDER — PAROXETINE HCL 20 MG PO TABS: 20.0000 mg | ORAL_TABLET | Freq: Every day | ORAL | 2 refills | Status: AC

## 2019-04-24 MED ORDER — LEVOTHYROXINE SODIUM 88 MCG PO TABS: 88.0000 ug | ORAL_TABLET | Freq: Every day | ORAL | 11 refills | Status: AC

## 2019-04-24 MED ORDER — ATORVASTATIN CALCIUM 40 MG PO TABS
40.0000 mg | ORAL_TABLET | Freq: Every day | ORAL | Status: DC
  Administered 2019-04-24: 18:00:00 40 mg via ORAL
  Filled 2019-04-24: qty 1

## 2019-04-24 MED ORDER — LEVETIRACETAM 500 MG PO TABS
500.0000 mg | ORAL_TABLET | Freq: Two times a day (BID) | ORAL | Status: DC
  Administered 2019-04-24 – 2019-04-25 (×2): 500 mg via ORAL
  Filled 2019-04-24 (×2): qty 1

## 2019-04-24 MED ORDER — ATORVASTATIN CALCIUM 40 MG PO TABS: 40.0000 mg | ORAL_TABLET | Freq: Every day | ORAL | Status: AC

## 2019-04-24 MED ORDER — IOHEXOL 350 MG/ML SOLN
100.0000 mL | Freq: Once | INTRAVENOUS | Status: AC | PRN
  Administered 2019-04-24: 100 mL via INTRAVENOUS

## 2019-04-24 MED ORDER — QUETIAPINE FUMARATE 50 MG PO TABS: 50.0000 mg | ORAL_TABLET | Freq: Every day | ORAL | Status: AC

## 2019-04-24 MED ORDER — MAG-OXIDE 200 MG PO TABS: 400.0000 mg | ORAL_TABLET | Freq: Two times a day (BID) | ORAL | 0 refills | Status: AC

## 2019-04-24 MED ORDER — DIVALPROEX SODIUM 250 MG PO DR TAB
250.0000 mg | DELAYED_RELEASE_TABLET | Freq: Every day | ORAL | Status: DC
  Administered 2019-04-24: 250 mg via ORAL
  Filled 2019-04-24: qty 1

## 2019-04-24 MED ORDER — QUETIAPINE FUMARATE 400 MG PO TABS: 400.0000 mg | ORAL_TABLET | Freq: Every day | ORAL | Status: AC

## 2019-04-24 NOTE — Progress Notes (Signed)
STROKE TEAM PROGRESS NOTE   INTERVAL HISTORY Pt RN at bedside. Pt back from CTA head and neck, which was unremarkable. No LVO. Right ICA bulb mild soft plaque. Pt still has left hand weakness but left arm proximal strength near normal. On keppra, changed to po.   Vitals:   04/24/19 0317 04/24/19 0800 04/24/19 0817 04/24/19 0827  BP: 126/62 113/62 (!) 112/56 121/76  Pulse: 83 96 93 62  Resp: 18 18 (!) 22 14  Temp: 99.1 F (37.3 C) 98.6 F (37 C) 98.8 F (37.1 C) 98 F (36.7 C)  TempSrc: Oral Oral Oral Oral  SpO2: 98% 98% 93% 97%  Weight:      Height:        CBC:  Recent Labs  Lab 04/22/19 1911 04/22/19 1924 04/23/19 0133 04/23/19 0753  WBC 6.4   < > 7.1 7.5  NEUTROABS 3.0  --   --   --   HGB 13.0   < > 12.8* 13.3  HCT 40.3   < > 40.3 40.1  MCV 99.8   < > 99.5 96.6  PLT 378   < > 396 398   < > = values in this interval not displayed.    Basic Metabolic Panel:  Recent Labs  Lab 04/23/19 0133 04/23/19 0619 04/23/19 0753 04/23/19 1027 04/23/19 2134 04/24/19 0432  NA 138   < > 141   < > 133* 138  K 5.2*   < > 4.3   < > 5.8* 4.0  CL 105   < > 108   < > 103 103  CO2 23   < > 24   < > 19* 23  GLUCOSE 128*   < > 111*   < > 125* 111*  BUN 16   < > 13   < > 15 12  CREATININE 1.24   < > 1.03   < > 0.97 1.09  CALCIUM 8.5*   < > 8.5*   < > 8.6* 8.9  MG 2.3  --  2.4  --   --   --   PHOS 2.6  --  2.5  --   --   --    < > = values in this interval not displayed.   Lipid Panel:     Component Value Date/Time   CHOL 128 04/24/2019 0753   TRIG 100 04/24/2019 0753   HDL 32 (L) 04/24/2019 0753   CHOLHDL 4.0 04/24/2019 0753   VLDL 20 04/24/2019 0753   LDLCALC 76 04/24/2019 0753   HgbA1c: No results found for: HGBA1C Urine Drug Screen:     Component Value Date/Time   LABOPIA NONE DETECTED 05/25/2014 1632   COCAINSCRNUR NONE DETECTED 05/25/2014 1632   LABBENZ POSITIVE (A) 05/25/2014 1632   AMPHETMU NONE DETECTED 05/25/2014 1632   THCU NONE DETECTED 05/25/2014 1632    LABBARB NONE DETECTED 05/25/2014 1632    Alcohol Level     Component Value Date/Time   ETH <5 05/25/2014 1522    IMAGING past 24 hours MR BRAIN WO CONTRAST  Result Date: 04/23/2019 CLINICAL DATA:  Subdural hematoma, stroke follow-up EXAM: MRI HEAD WITHOUT CONTRAST TECHNIQUE: Multiplanar, multiecho pulse sequences of the brain and surrounding structures were obtained without intravenous contrast. COMPARISON:  Head CT 04/22/2019 FINDINGS: Brain: Left cerebral convexity subdural hematoma is again identified measuring up to 8 mm in thickness. There is minor mass effect on the underlying parenchyma. There are small areas of cortical reduced diffusion in the right frontal lobe  and anterior insula. Small chronic right parietal cortical infarcts. Chronic anterior division left MCA territory infarction. Chronic small vessel infarct of the right corona radiata. No hydrocephalus. Vascular: Major vessel flow voids at the skull base are preserved. Skull and upper cervical spine: Normal marrow signal is preserved. Sinuses/Orbits: Paranasal sinuses are aerated. Orbits are unremarkable. Other: Sella is unremarkable.  Mastoid air cells are clear. IMPRESSION: Small acute right frontal MCA territory cortical infarcts. Chronic small right parietal cortical infarcts, left frontal infarct, and small vessel infarct of the right corona radiata. Left cerebral convexity subdural hematoma without significant mass effect. Electronically Signed   By: Macy Mis M.D.   On: 04/23/2019 17:33   EEG adult  Result Date: 04/23/2019 Lora Havens, MD     04/23/2019 11:07 AM Patient Name: Ricky Ward MRN: [id number] Epilepsy Attending: Lora Havens Referring Physician/Provider: Dr Amie [place] Date: 04/22/2019 Duration: 24.38 mins  Patient history: 69 year old man who presented with symptoms of left-sided weakness and noted to have a left-sided subdural hematoma. EEG to evaluate for seizure  Level of alertness: awake  AEDs  during EEG study: LEV  Technical aspects: This EEG study was done with scalp electrodes positioned according to the 10-20 International system of electrode placement. Electrical activity was acquired at a sampling rate of 500Hz  and reviewed with a high frequency filter of 70Hz  and a low frequency filter of 1Hz . EEG data were recorded continuously and digitally stored.  DESCRIPTION: No clear posterior dominant rhythm was seen. EEG showed continuous generalized 6-8Hz  theta slowing. Hyperventilation and photic stimulation were not performed.  ABNORMALITY - Continuous slow, generalized  IMPRESSION: This study is suggestive of moderate diffuse encephalopathy, non specific to etiology. No seizures or epileptiform discharges were seen throughout the recording.  Priyanka Barbra Sarks    PHYSICAL EXAM  Temp:  [98 F (36.7 C)-99.1 F (37.3 C)] 98.7 F (37.1 C) (03/12 1558) Pulse Rate:  [62-96] 80 (03/12 1558) Resp:  [14-22] 14 (03/12 1558) BP: (112-132)/(56-93) 132/69 (03/12 1558) SpO2:  [93 %-100 %] 94 % (03/12 1558) Weight:  [76.9 kg] 76.9 kg (03/11 2120)  General - Well nourished, well developed, in no apparent distress.  Ophthalmologic - fundi not visualized due to noncooperation.  Cardiovascular - Regular rhythm and rate.  Mental Status -  Level of arousal and orientation to time, place, and person were intact. Language including expression, naming, repetition, comprehension was assessed and found intact. Mild dysarthria Fund of Knowledge was assessed and was intact.  Cranial Nerves II - XII - II - Visual field intact OU. III, IV, VI - Extraocular movements showed chronic disconjugate, bilateral eyes outward gaze with incomplete adduction.  V - Facial sensation intact bilaterally. VII - Facial movement intact bilaterally. VIII - Hearing & vestibular intact bilaterally. X - Palate elevates symmetrically. Mild dysarthria XI - Chin turning & shoulder shrug intact bilaterally. XII - Tongue  protrusion intact.  Motor Strength - The patient's strength was normal in all extremities and pronator drift was absent except decreased left hand grip and dexterity.  Bulk was normal and fasciculations were absent.   Motor Tone - Muscle tone was assessed at the neck and appendages and was normal.  Reflexes - The patient's reflexes were symmetrical in all extremities and he had no pathological reflexes.  Sensory - Light touch, temperature/pinprick were assessed and were symmetrical.    Coordination - The patient had normal movements in the hands with no ataxia or dysmetria.  Tremor was absent.  Gait and  Station - deferred.   ASSESSMENT/PLAN [name] is a 69 y.o. male with history of HLD, hypothyroidism and bipolar disorder presenting from ALF with L sided weakness.   Stroke:  small scattered R MCA frontal cortical infarcts, embolic secondary to unclear source  Code Stroke CT head L SDH w/ mass effect and 21mm midline shift. Old L MCA infarct. ASPECTS 10.     MRI  Small scattered R MCA frontal cortical infarcts. Old small R parietal cortical, L frontal and R corona radiata infarcts with laminar necrosis.   CTA head & neck - unremarkable  2D Echo EF 55-60%. No source of embolus   Consider outpt 30 day cardiac event monitoring to rule out afib  LDL 76  HgbA1c pending    SCDs for VTE prophylaxis  No antithrombotic prior to admission, now on No antithrombotic given SDH. Consider ASA 81 about 7 days post SDH if clinically stable   Therapy recommendations:  No PT needs  Disposition:  pending  (from ALF)  L SDH  CT head L SDH w/ mass effect and 52mm midline shift.   CTA head and neck again showed stable SDH with 33mm MLS  Initially treated with 3% saline, now off  On Keppra 500 bid    EEG neg sz  NSY signed off ([place]sterg[place]) f/u in OP clinic  Blood Pressure  No hx HTN PTA, on no home meds  Current BP stable 110-120s . BP goal  normotensive  Hyperlipidemia  Home meds:  lipitor 20  Now on lipitor 40  LDL 76, goal < 70  Continue statin at discharge  Other Stroke Risk Factors  Advanced age  Former Cigarette smoker  Other Active Problems  Thyroid disease  Bipolar 1 d/o on depakote PTA  Hospital day # 2  Neurology will sign off. Please call with questions. Pt will follow up with stroke clinic NP at [facility] in about 4 weeks. Thanks for the consult.  Rosalin Hawking, MD PhD Stroke Neurology 04/24/2019 4:35 PM    To contact Stroke Continuity provider, please refer to [email or web address] After hours, contact General Neurology

## 2019-04-24 NOTE — TOC Initial Note (Addendum)
Transition of Care [facility]) - Initial/Assessment Note    Patient Details  Name: Ricky Ward MRN: [id number] Date of Birth: [birth year]  [facility] CM/SW Contact:    Pollie Friar, RN Phone Number: 04/24/2019, 11:40 AM  Clinical Narrative:                 MD states pt may be ready to d/c back to [facility] ALF tomorrow. CM reached out to [facility] and spoke to [place]. She states they can accept pt back tomorrow but will need any new medication orders today so they can have for tomorrow. MD updated and will provide these today.  FL2 started and will complete once meds ordered.  TOC following.   1425: addendum: have faxed the facility the [facility] with d/c meds. If any new meds added then will need paper scripts with a start date for the medication for Monday or later.  Fax for [employer]: [phone]   Expected Discharge Plan: Assisted Living Barriers to Discharge: Continued Medical Work up   Patient Goals and CMS Choice     Choice offered to / list presented to : Patient  Expected Discharge Plan and Services Expected Discharge Plan: Assisted Living In-house Referral: Clinical Social Work Discharge Planning Services: CM Consult Post Acute Care Choice: (long term assisted living) Living arrangements for the past 2 months: [place]                                      Prior Living Arrangements/Services Living arrangements for the past 2 months: [place] Lives with:: Facility Resident Patient language and need for interpreter reviewed:: Yes Do you feel safe going back to the place where you live?: Yes      Need for Family Participation in Patient Care: Yes (Comment) Care giver support system in place?: Yes (comment)   Criminal Activity/Legal Involvement Pertinent to Current Situation/Hospitalization: No - Comment as needed  Activities of Daily Living      Permission Sought/Granted                  Emotional  Assessment Appearance:: Appears stated age     Orientation: : Oriented to Self, Oriented to Place, Oriented to  Time, Oriented to Situation   Psych Involvement: No (comment)  Admission diagnosis:  Subdural hemorrhage ([place]) [I62.00] Subdural hematoma ([place]) [S06.5X9A] Finger laceration, initial encounter [id number] Patient Active Problem List   Diagnosis Date Noted  . Acute ischemic stroke ([place]) 04/24/2019  . Subdural hemorrhage ([place]) 04/22/2019  . BPH (benign prostatic hyperplasia) 04/07/2019  . Urinary retention due to benign prostatic hyperplasia 04/07/2019  . Bipolar 1 disorder ([place])   . Aspiration pneumonia ([place]) 04/04/2019  . Acute respiratory failure with hypoxia ([place]) 04/04/2019  . Dysphagia 04/04/2019  . Hypothyroidism 04/04/2019  . HLD (hyperlipidemia) 04/04/2019  . Heartburn 03/12/2019  . Preventative health care 04/28/2018  . Alcohol use disorder, mild, in sustained remission 10/09/2017   PCP:  Rosita Fire, MD Pharmacy:   [facility], [address] [place] [address] Phone: [phone] Fax: [phone]     Social Determinants of Health (SDOH) Interventions    Readmission Risk Interventions No flowsheet data found.

## 2019-04-24 NOTE — NC FL2 (Signed)
[place] LEVEL OF CARE SCREENING TOOL     IDENTIFICATION  Patient Name: Ricky Ward Birthdate: 1950-02-14 Sex: male Admission Date (Current Location): 04/22/2019  [facility] and [place] Number:  Herbalist and Address:  [facility] [place], [place] [address], [place], [place] [address]      Provider Number: [id number]  Attending Physician Name and Address:  Edwin Dada, *  Relative Name and Phone Number:       Current Level of Care: Hospital Recommended Level of Care: [place] Prior Approval Number:    Date Approved/Denied:   PASRR Number:    Discharge Plan: Domiciliary (Rest home)(Assisted Living)    Current Diagnoses: Patient Active Problem List   Diagnosis Date Noted  . Acute ischemic stroke ([place]) 04/24/2019  . Subdural hemorrhage ([place]) 04/22/2019  . BPH (benign prostatic hyperplasia) 04/07/2019  . Urinary retention due to benign prostatic hyperplasia 04/07/2019  . Bipolar 1 disorder ([place])   . Aspiration pneumonia ([place]) 04/04/2019  . Acute respiratory failure with hypoxia ([place]) 04/04/2019  . Dysphagia 04/04/2019  . Hypothyroidism 04/04/2019  . HLD (hyperlipidemia) 04/04/2019  . Heartburn 03/12/2019  . Preventative health care 04/28/2018  . Alcohol use disorder, mild, in sustained remission 10/09/2017    Orientation RESPIRATION BLADDER Height & Weight     Self, Time, Situation, Place  Normal Continent Weight: 76.9 kg Height:  5\' 7"  (170.2 cm)  BEHAVIORAL SYMPTOMS/MOOD NEUROLOGICAL BOWEL NUTRITION STATUS      Continent Diet(regular diet with thin liquids)  AMBULATORY STATUS COMMUNICATION OF NEEDS Skin   Limited Assist Verbally Bruising(bruising to bilateral ams and hands)                       Personal Care Assistance Level of Assistance  Bathing, Feeding, Dressing Bathing Assistance: Limited assistance Feeding assistance: Independent Dressing Assistance: Limited assistance      Functional Limitations Info  Sight, Hearing, Speech Sight Info: Adequate Hearing Info: Adequate Speech Info: Adequate    SPECIAL CARE FACTORS FREQUENCY                       Contractures Contractures Info: Not present    Additional Factors Info  Code Status, Allergies, Psychotropic Code Status Info: Full Allergies Info: Penicillin Psychotropic Info: Depakote DR 250mg  at Bedtime/ Marinol 2.5 mg BiD/ Keppra 500 mg BID           Discharge Medications: Medication List    TAKE these medications   atorvastatin 40 MG tablet Commonly known as: LIPITOR Take 1 tablet (40 mg total) by mouth daily at 6 PM. What changed:   medication strength  how much to take  when to take this   clotrimazole-betamethasone cream Commonly known as: LOTRISONE Apply 1 application topically 2 (two) times daily as needed (rash).   divalproex 250 MG DR tablet Commonly known as: Depakote Take 1 tablet (250 mg total) by mouth at bedtime.   dronabinol 2.5 MG capsule Commonly known as: MARINOL Take 2.5 mg by mouth 2 (two) times daily before a meal.   Enstilar 0.005-0.064 % Foam Generic drug: Calcipotriene-Betameth Diprop Apply topically. Use as directed   famotidine 20 MG tablet Commonly known as: Pepcid Take 1 tablet (20 mg total) by mouth as needed for heartburn or indigestion (May use once to twice daily). What changed: when to take this   hydrocortisone 2.5 % cream Apply 1 application topically daily. *Apply to face, scalp,  neck, and behind the ears   ketoconazole 2 % shampoo Commonly known as: NIZORAL Apply 1 application topically daily.   ketoconazole 2 % cream Commonly known as: NIZORAL Apply 1 application topically daily. Applied to the face, scalp, neck, and behind the ears   levETIRAcetam 500 MG tablet Commonly known as: KEPPRA Take 1 tablet (500 mg total) by mouth 2 (two) times daily.   levothyroxine 88 MCG tablet Commonly known as: Synthroid Take  1 tablet (88 mcg total) by mouth daily.   Mag-Oxide 200 MG Tabs Generic drug: Magnesium Oxide Take 2 tablets (400 mg total) by mouth in the morning and at bedtime.   PARoxetine 20 MG tablet Commonly known as: PAXIL Take 1 tablet (20 mg total) by mouth daily.   QUEtiapine 400 MG tablet Commonly known as: SEROQUEL Take 1 tablet (400 mg total) by mouth at bedtime.   QUEtiapine 50 MG tablet Commonly known as: SEROQUEL Take 1 tablet (50 mg total) by mouth at bedtime.   sennosides-docusate sodium 8.6-50 MG tablet Commonly known as: SENOKOT-S Take 1 tablet by mouth daily as needed for constipation.      Relevant Imaging Results:  Relevant Lab Results:   Additional Information SSN [id number]  Pollie Friar, RN

## 2019-04-24 NOTE — Discharge Instructions (Addendum)
Follow up with Dr. Zada Finders in 2 weeks after discharge. If you do not already have a discharge appointment, please call his office at [phone] to schedule a follow up appointment.

## 2019-04-24 NOTE — Progress Notes (Signed)
PROGRESS NOTE    Ricky Ward  [id number] DOB: 1950/10/08 DOA: 04/22/2019 PCP: Rosita Fire, MD      Brief Narrative:  Ricky Ward is a 69 y.o. M with Bipolar disorder, hypothyoidism, resident of LTC who presented with fall.  On EMS arrival, noted to have flaccid left arm and leg.  In the ER, CODE STROKE called.  CT head showed a large subdural hematoma.  Neurology and Neurosurgery were consulted, and he was started on hypertonic saline.         Assessment & Plan:  Acute right frontal ischemic infarct Admitted as CODE STROKE, CT head showed left sided SDH. Deficits however (left sided weakness) did not correlate and subsequent MRI showed an acute right frontal lobe infarct.  -Non-invasive angiography and carotid imaging pending -Echocardiogram pending -Lipids ordered -- continue atorvastatin -Aspirin deferred given SDH -Atrial fibrillation: not present on previous strips -tPA not given because SDH -Dysphagia screen completed in ER -NIHSS 7 at admission, in ER -PT eval ordered: recommended no PT follow up -Smoking cessation: not pertinent, non-smoker     Left subdural hematoma -Consult Neurosurgery, no intervention planned, appreciate expert advice -Continue new Keppra  Bipolar disorder -Resume Depakote  AKI Cr 1.6 at admission, now resolved to baseline 1.1  GERD -Resume Pepcid at discharge          Disposition: The patient was admitted with left sided weakness.  Found to have old SDH and new right frontal infarct.   I will discharge when he has completed CTA neck and Neurology have finalized their recommendations.        MDM: The below labs and imaging reports were reviewed and summarized above.  Medication management as above.   DVT prophylaxis: SCDs Code Status: FULL Family Communication: Sister at bedside    Consultants:   Neurology  Neurosurgery  Procedures:   3/10 CT head -- left sided SDH with 61mm shift  3/11 EEG --  no seizure activity  3/11 EEG -- no seizure activity  3/12 echo -- pending  3/12 CTA neck -- pending  Antimicrobials:      Culture data:              Subjective: Patient feeling well.  Still hurts a lot, but he has no left-sided weakness or numbness.  No speech changes.  No vision changes, no headache.  Objective: Vitals:   04/24/19 0800 04/24/19 0817 04/24/19 0827 04/24/19 1158  BP: 113/62 (!) 112/56 121/76 124/66  Pulse: 96 93 62 81  Resp: 18 (!) 22 14 (!) 22  Temp: 98.6 F (37 C) 98.8 F (37.1 C) 98 F (36.7 C) 99 F (37.2 C)  TempSrc: Oral Oral Oral Oral  SpO2: 98% 93% 97% 96%  Weight:      Height:        Intake/Output Summary (Last 24 hours) at 04/24/2019 1348 Last data filed at 04/24/2019 0000 Gross per 24 hour  Intake 539.83 ml  Output 600 ml  Net -60.17 ml   Filed Weights   04/22/19 1900 04/23/19 0342 04/23/19 2120  Weight: 76.6 kg 75.2 kg 76.9 kg    Examination: General appearance:  adult male, alert and in no acute distress.  Lying in bed HEENT: Anicteric, conjunctiva pink, lids and lashes normal. No nasal deformity, discharge, epistaxis.  Chronic exotropia noted.  Lips moist, edentulous, oropharynx moist, no oral lesions, hearing normal.   Skin: Warm and dry.  No jaundice.  No suspicious rashes or lesions. Cardiac: RRR, nl S1-S2, no  murmurs appreciated.  Capillary refill is brisk.  JVP normal.  No LE edema.  Radial pulses 2+ and symmetric. Respiratory: Normal respiratory rate and rhythm.  CTAB without rales or wheezes. Abdomen: Abdomen soft.  No TTP or guarding. No ascites, distension, hepatosplenomegaly.   MSK: No deformities or effusions. Neuro: Awake and alert.  Strength symmetric and 5/5 in both upper and lower extremities.  Sensation intact to light touch.  Speech fluent.  Pupils equal round and reactive. Psych: Sensorium intact and responding to questions, attention normal. Affect pleasant.  Judgment and insight appear moderately  impaired, chronically.    Data Reviewed: I have personally reviewed following labs and imaging studies:  CBC: Recent Labs  Lab 04/22/19 1911 04/22/19 1924 04/23/19 0133 04/23/19 0753  WBC 6.4  --  7.1 7.5  NEUTROABS 3.0  --   --   --   HGB 13.0 13.6 12.8* 13.3  HCT 40.3 40.0 40.3 40.1  MCV 99.8  --  99.5 96.6  PLT 378  --  396 [id number]   Basic Metabolic Panel: Recent Labs  Lab 04/23/19 0133 04/23/19 0619 04/23/19 0753 04/23/19 1027 04/23/19 1428 04/23/19 2134 04/24/19 0432  NA 138   < > 141 143 140 133* 138  K 5.2*   < > 4.3 4.4 4.5 5.8* 4.0  CL 105   < > 108 111 108 103 103  CO2 23   < > 24 23 22  19* 23  GLUCOSE 128*   < > 111* 110* 129* 125* 111*  BUN 16   < > 13 13 13 15 12   CREATININE 1.24   < > 1.03 1.09 1.10 0.97 1.09  CALCIUM 8.5*   < > 8.5* 8.6* 8.6* 8.6* 8.9  MG 2.3  --  2.4  --   --   --   --   PHOS 2.6  --  2.5  --   --   --   --    < > = values in this interval not displayed.   GFR: Estimated Creatinine Clearance: 59 mL/min (by C-G formula based on SCr of 1.09 mg/dL). Liver Function Tests: Recent Labs  Lab 04/22/19 1911  AST 27  ALT 18  ALKPHOS 58  BILITOT 0.5  PROT 7.4  ALBUMIN 3.2*   No results for input(s): LIPASE, AMYLASE in the last 168 hours. No results for input(s): AMMONIA in the last 168 hours. Coagulation Profile: Recent Labs  Lab 04/22/19 1911  INR 1.0   Cardiac Enzymes: No results for input(s): CKTOTAL, CKMB, CKMBINDEX, TROPONINI in the last 168 hours. BNP (last 3 results) No results for input(s): PROBNP in the last 8760 hours. HbA1C: No results for input(s): HGBA1C in the last 72 hours. CBG: Recent Labs  Lab 04/23/19 0352  GLUCAP 100*   Lipid Profile: Recent Labs    04/24/19 0753  CHOL 128  HDL 32*  LDLCALC 76  TRIG 100  CHOLHDL 4.0   Thyroid Function Tests: Recent Labs    04/23/19 0133 04/23/19 0134  TSH 0.739  --   FREET4  --  0.60*   Anemia Panel: No results for input(s): VITAMINB12, FOLATE, FERRITIN,  TIBC, IRON, RETICCTPCT in the last 72 hours. Urine analysis:    Component Value Date/Time   COLORURINE YELLOW 04/23/2019 [place] 04/23/2019 2230   LABSPEC 1.014 04/23/2019 2230   PHURINE 7.0 04/23/2019 2230   GLUCOSEU NEGATIVE 04/23/2019 2230   HGBUR NEGATIVE 04/23/2019 2230   BILIRUBINUR NEGATIVE 04/23/2019 2230   KETONESUR  NEGATIVE 04/23/2019 2230   PROTEINUR NEGATIVE 04/23/2019 2230   UROBILINOGEN 1.0 04/15/2013 2138   NITRITE NEGATIVE 04/23/2019 2230   LEUKOCYTESUR NEGATIVE 04/23/2019 2230   Sepsis Labs: @LABRCNTIP (procalcitonin:4,lacticacidven:4)  ) Recent Results (from the past 240 hour(s))  Respiratory Panel by RT PCR (Flu A&B, Covid) - Nasopharyngeal Swab     Status: None   Collection Time: 04/22/19 10:06 PM   Specimen: Nasopharyngeal Swab  Result Value Ref Range Status   SARS Coronavirus 2 by RT PCR NEGATIVE NEGATIVE Final    Comment: (NOTE) SARS-CoV-2 target nucleic acids are NOT DETECTED. The SARS-CoV-2 RNA is generally detectable in upper respiratoy specimens during the acute phase of infection. The lowest concentration of SARS-CoV-2 viral copies this assay can detect is 131 copies/mL. A negative result does not preclude SARS-Cov-2 infection and should not be used as the sole basis for treatment or other patient management decisions. A negative result may occur with  improper specimen collection/handling, submission of specimen other than nasopharyngeal swab, presence of viral mutation(s) within the areas targeted by this assay, and inadequate number of viral copies (<131 copies/mL). A negative result must be combined with clinical observations, patient history, and epidemiological information. The expected result is Negative. Fact Sheet for Patients:  PinkCheek.be Fact Sheet for Healthcare Providers:  GravelBags.it This test is not yet ap proved or cleared by the [place] FDA and    has been authorized for detection and/or diagnosis of SARS-CoV-2 by FDA under an Emergency Use Authorization (EUA). This EUA will remain  in effect (meaning this test can be used) for the duration of the COVID-19 declaration under Section 564(b)(1) of the Act, 21 U.S.C. section 360bbb-3(b)(1), unless the authorization is terminated or revoked sooner.    Influenza A by PCR NEGATIVE NEGATIVE Final   Influenza B by PCR NEGATIVE NEGATIVE Final    Comment: (NOTE) The Xpert Xpress SARS-CoV-2/FLU/RSV assay is intended as an aid in  the diagnosis of influenza from Nasopharyngeal swab specimens and  should not be used as a sole basis for treatment. Nasal washings and  aspirates are unacceptable for Xpert Xpress SARS-CoV-2/FLU/RSV  testing. Fact Sheet for Patients: PinkCheek.be Fact Sheet for Healthcare Providers: GravelBags.it This test is not yet approved or cleared by the [place] FDA and  has been authorized for detection and/or diagnosis of SARS-CoV-2 by  FDA under an Emergency Use Authorization (EUA). This EUA will remain  in effect (meaning this test can be used) for the duration of the  Covid-19 declaration under Section 564(b)(1) of the Act, 21  U.S.C. section 360bbb-3(b)(1), unless the authorization is  terminated or revoked. Performed at [facility], [place] [address] [place], [place] [address]   MRSA PCR Screening     Status: None   Collection Time: 04/23/19  3:42 AM   Specimen: Nasal Mucosa; Nasopharyngeal  Result Value Ref Range Status   MRSA by PCR NEGATIVE NEGATIVE Final    Comment:        The GeneXpert MRSA Assay (FDA approved for NASAL specimens only), is one component of a comprehensive MRSA colonization surveillance program. It is not intended to diagnose MRSA infection nor to guide or monitor treatment for MRSA infections. Performed at [facility], [place] [address] [place], [place]  [address]          Radiology Studies: EEG  Result Date: 04/23/2019 Lora Havens, MD     04/23/2019 11:10 AM Patient Name: JAQUORI HUNSINGER MRN: [id number] Epilepsy Attending: Cecil Cranker  Hortense Ramal Referring Physician/Provider: Dr Kerney Elbe Date: 04/23/2019 Duration: 23.29 mins Patient history: 69 year old man who presented with symptoms of left-sided weakness and noted to have a left-sided subdural hematoma. EEG to evaluate for seizure Level of alertness: awake AEDs during EEG study: LEV Technical aspects: This EEG study was done with scalp electrodes positioned according to the 10-20 International system of electrode placement. Electrical activity was acquired at a sampling rate of 500Hz  and reviewed with a high frequency filter of 70Hz  and a low frequency filter of 1Hz . EEG data were recorded continuously and digitally stored. DESCRIPTION: No clear posterior dominant rhythm was seen. EEG showed continuous generalized and maximal right frontotemporal region 6-8Hz  theta slowing. Hyperventilation and photic stimulation were not performed. ABNORMALITY - Continuous slow, generalized and maximal right frontotemporal region IMPRESSION: This study is suggestive of cortical dysfunction and right frontotemporal region, nonspecific to etiology as well as mild diffuse encephalopathy, non specific to etiology. No seizures or epileptiform discharges were seen throughout the recording. Lora Havens   MR BRAIN WO CONTRAST  Result Date: 04/23/2019 CLINICAL DATA:  Subdural hematoma, stroke follow-up EXAM: MRI HEAD WITHOUT CONTRAST TECHNIQUE: Multiplanar, multiecho pulse sequences of the brain and surrounding structures were obtained without intravenous contrast. COMPARISON:  Head CT 04/22/2019 FINDINGS: Brain: Left cerebral convexity subdural hematoma is again identified measuring up to 8 mm in thickness. There is minor mass effect on the underlying parenchyma. There are small areas of cortical reduced diffusion in  the right frontal lobe and anterior insula. Small chronic right parietal cortical infarcts. Chronic anterior division left MCA territory infarction. Chronic small vessel infarct of the right corona radiata. No hydrocephalus. Vascular: Major vessel flow voids at the skull base are preserved. Skull and upper cervical spine: Normal marrow signal is preserved. Sinuses/Orbits: Paranasal sinuses are aerated. Orbits are unremarkable. Other: Sella is unremarkable.  Mastoid air cells are clear. IMPRESSION: Small acute right frontal MCA territory cortical infarcts. Chronic small right parietal cortical infarcts, left frontal infarct, and small vessel infarct of the right corona radiata. Left cerebral convexity subdural hematoma without significant mass effect. Electronically Signed   By: Macy Mis M.D.   On: 04/23/2019 17:33   DG Hand 2 View Left  Result Date: 04/22/2019 CLINICAL DATA:  Fall with injury to the fingers EXAM: LEFT HAND - 2 VIEW COMPARISON:  None. FINDINGS: No fracture or malalignment. Surgical plate and fixating screws in the distal radius. IMPRESSION: No acute osseous abnormality Electronically Signed   By: Donavan Foil M.D.   On: 04/22/2019 21:39   EEG adult  Result Date: 04/23/2019 Lora Havens, MD     04/23/2019 11:07 AM Patient Name: SHRAY BONELLO MRN: [id number] Epilepsy Attending: Lora Havens Referring Physician/Provider: Dr Amie [place] Date: 04/22/2019 Duration: 24.38 mins  Patient history: 69 year old man who presented with symptoms of left-sided weakness and noted to have a left-sided subdural hematoma. EEG to evaluate for seizure  Level of alertness: awake  AEDs during EEG study: LEV  Technical aspects: This EEG study was done with scalp electrodes positioned according to the 10-20 International system of electrode placement. Electrical activity was acquired at a sampling rate of 500Hz  and reviewed with a high frequency filter of 70Hz  and a low frequency filter of 1Hz . EEG  data were recorded continuously and digitally stored.  DESCRIPTION: No clear posterior dominant rhythm was seen. EEG showed continuous generalized 6-8Hz  theta slowing. Hyperventilation and photic stimulation were not performed.  ABNORMALITY - Continuous slow, generalized  IMPRESSION: This study  is suggestive of moderate diffuse encephalopathy, non specific to etiology. No seizures or epileptiform discharges were seen throughout the recording.  Lora Havens   ECHOCARDIOGRAM COMPLETE  Result Date: 04/24/2019    ECHOCARDIOGRAM REPORT   Patient Name:   PADRAIG CHIDESTER Date of Exam: 04/24/2019 Medical Rec #:  [id number]       Height:       67.0 in Accession #:    [id number]      Weight:       169.5 lb Date of Birth:  1950/04/04        BSA:          1.885 m Patient Age:    67 years        BP:           121/76 mmHg Patient Gender: M               HR:           62 bpm. Exam Location:  Inpatient Procedure: 2D Echo Indications:    stroke 434.91  History:        Patient has no prior history of Echocardiogram examinations.                 Risk Factors:Dyslipidemia and Former Smoker.  Sonographer:    Jannett Celestine RDCS (AE) Referring Phys: [id number] Rosalin Hawking IMPRESSIONS  1. Left ventricular ejection fraction, by estimation, is 55 to 60%. The left ventricle has normal function. The left ventricle has no regional wall motion abnormalities. Left ventricular diastolic parameters are consistent with Grade I diastolic dysfunction (impaired relaxation).  2. Right ventricular systolic function is normal. The right ventricular size is normal. There is mildly elevated pulmonary artery systolic pressure.  3. The mitral valve is normal in structure. No evidence of mitral valve regurgitation. No evidence of mitral stenosis.  4. The aortic valve is normal in structure. Aortic valve regurgitation is not visualized. No aortic stenosis is present. FINDINGS  Left Ventricle: Left ventricular ejection fraction, by estimation, is 55 to 60%.  The left ventricle has normal function. The left ventricle has no regional wall motion abnormalities. The left ventricular internal cavity size was normal in size. There is  no left ventricular hypertrophy. Left ventricular diastolic parameters are consistent with Grade I diastolic dysfunction (impaired relaxation). Right Ventricle: The right ventricular size is normal. No increase in right ventricular wall thickness. Right ventricular systolic function is normal. There is mildly elevated pulmonary artery systolic pressure. The tricuspid regurgitant velocity is 2.73  m/s, and with an assumed right atrial pressure of 10 mmHg, the estimated right ventricular systolic pressure is [id number] mmHg. Left Atrium: Left atrial size was normal in size. Right Atrium: Right atrial size was normal in size. Pericardium: There is no evidence of pericardial effusion. Mitral Valve: The mitral valve is normal in structure. No evidence of mitral valve regurgitation. No evidence of mitral valve stenosis. Tricuspid Valve: The tricuspid valve is normal in structure. Tricuspid valve regurgitation is trivial. No evidence of tricuspid stenosis. Aortic Valve: The aortic valve is normal in structure. Aortic valve regurgitation is not visualized. No aortic stenosis is present. Pulmonic Valve: The pulmonic valve was normal in structure. Pulmonic valve regurgitation is not visualized. No evidence of pulmonic stenosis. Aorta: The aortic root and ascending aorta are structurally normal, with no evidence of dilitation. IAS/Shunts: The atrial septum is grossly normal.  LEFT VENTRICLE PLAX 2D LVIDd:         3.20 cm  Diastology LVIDs:         2.40 cm  LV e' lateral:   5.98 cm/s LV PW:         0.70 cm  LV E/e' lateral: 15.8 LV IVS:        0.90 cm  LV e' medial:    6.74 cm/s LVOT diam:     2.00 cm  LV E/e' medial:  14.0 LV SV:         45 LV SV Index:   24 LVOT Area:     3.14 cm  RIGHT VENTRICLE RV S prime:     14.40 cm/s TAPSE (M-mode): 1.4 cm LEFT ATRIUM              Index       RIGHT ATRIUM           Index LA diam:        2.50 cm 1.33 cm/m  RA Area:     10.20 cm LA Vol (A2C):   42.8 ml 22.70 ml/m RA Volume:   19.30 ml  10.24 ml/m LA Vol (A4C):   27.5 ml 14.59 ml/m LA Biplane Vol: 34.7 ml 18.41 ml/m  AORTIC VALVE LVOT Vmax:   83.50 cm/s LVOT Vmean:  58.600 cm/s LVOT VTI:    0.143 m  AORTA Ao Root diam: 2.80 cm MITRAL VALVE                TRICUSPID VALVE MV Area (PHT): 2.93 cm     TR Peak grad:   29.8 mmHg MV Decel Time: 259 msec     TR Vmax:        273.00 cm/s MV E velocity: 94.60 cm/s MV A velocity: 130.00 cm/s  SHUNTS MV E/A ratio:  0.73         Systemic VTI:  0.14 m                             Systemic Diam: 2.00 cm Mertie Moores MD Electronically signed by Mertie Moores MD Signature Date/Time: 04/24/2019/12:49:29 PM    Final    CT HEAD CODE STROKE WO CONTRAST  Addendum Date: 04/22/2019   ADDENDUM REPORT: 04/22/2019 19:21 ADDENDUM: Study discussed by telephone with Dr. Rory Percy on 04/22/2019 at 1916 hours. Electronically Signed   By: Genevie Ann M.D.   On: 04/22/2019 19:21   Result Date: 04/22/2019 CLINICAL DATA:  Code stroke. 69 year old male with left side weakness and slurred speech. EXAM: CT HEAD WITHOUT CONTRAST TECHNIQUE: Contiguous axial images were obtained from the base of the skull through the vertex without intravenous contrast. COMPARISON:  Report of [facility] noncontrast head CT 04/20/2013 (no images available). FINDINGS: Brain: Left side mixed density but mostly hypodense subdural hematoma throughout much of the hemisphere measuring up to 7 mm in thickness (series 2, image 23 and coronal image 34). Small areas of hyperdense blood products within the subdural collection. Intracranial mass effect with rightward midline shift of 6-7 mm. Mild mass effect on the left lateral ventricle. No ventriculomegaly. Basilar cisterns remain patent. Superimposed chronic appearing cortical encephalomalacia affecting the  anterior left insula and inferior frontal gyrus (coronal image 26). Patchy hemisphere white matter hypodensity elsewhere. No other intracranial hemorrhage identified. No superimposed cortically based acute infarct identified. Vascular: Calcified atherosclerosis at the skull base. No suspicious intracranial vascular hyperdensity. Skull: No skull fracture identified. No acute osseous abnormality identified. Sinuses/Orbits: Visualized paranasal sinuses and  mastoids are clear. Other: Disconjugate gaze. No acute orbit or scalp soft tissue finding. ASPECTS [facility] Stroke Program Early CT Score) Total score (0-10 with 10 being normal): 10 (chronic left MCA infarct). IMPRESSION: 1. Positive for left side Subdural Hematoma measuring up to 7 mm in thickness. Intracranial mass effect with rightward midline shift of 7 mm. 2. Underlying chronic anterior division Left MCA infarct. No acute cortically based infarct identified. 3. These results were communicated to Dr. Lorraine Lax at 7:13 pm on 04/22/2019 by text page via the [facility] messaging system. Electronically Signed: By: Genevie Ann M.D. On: 04/22/2019 19:15   [place] EKG SITE RITE  Result Date: 04/22/2019 If Site Rite image not attached, placement could not be confirmed due to current cardiac rhythm.       Scheduled Meds: . atorvastatin  40 mg Oral q1800  . Chlorhexidine Gluconate Cloth  6 each Topical Daily  . divalproex  250 mg Oral QHS  . dronabinol  2.5 mg Oral BID AC  . levETIRAcetam  500 mg Oral BID  . lidocaine (PF)  5 mL Intradermal Once  . mouth rinse  15 mL Mouth Rinse BID  . sodium chloride flush  10-40 mL Intracatheter Q12H  . sodium chloride flush  3 mL Intravenous Once   Continuous Infusions: . sodium chloride 250 mL (04/24/19 0640)     LOS: 2 days    Time spent: 35 minutes    Edwin Dada, MD [facility] 04/24/2019, 1:48 PM     Please page though [place] or Epic secure chat:  For [employer], Adult nurse

## 2019-04-24 NOTE — Progress Notes (Signed)
  Echocardiogram 2D Echocardiogram has been performed.  Ricky Ward 04/24/2019, 10:53 AM

## 2019-04-24 NOTE — Progress Notes (Addendum)
Neurosurgery Service Progress Note  Subjective: No acute events overnight, pt denies any recurrence of Sx  Objective: Vitals:   04/24/19 0317 04/24/19 0800 04/24/19 0817 04/24/19 0827  BP: 126/62 113/62 (!) 112/56 121/76  Pulse: 83 96 93 62  Resp: 18 18 (!) 22 14  Temp: 99.1 F (37.3 C) 98.6 F (37 C) 98.8 F (37.1 C) 98 F (36.7 C)  TempSrc: Oral Oral Oral Oral  SpO2: 98% 98% 93% 97%  Weight:      Height:       Temp (24hrs), Avg:98.5 F (36.9 C), Min:98 F (36.7 C), Max:99.1 F (37.3 C)  CBC Latest Ref Rng & Units 04/23/2019 04/23/2019 04/22/2019  WBC 4.0 - 10.5 K/uL 7.5 7.1 -  Hemoglobin 13.0 - 17.0 g/dL 13.3 12.8(L) 13.6  Hematocrit 39.0 - 52.0 % 40.1 40.3 40.0  Platelets 150 - 400 K/uL 398 396 -   BMP Latest Ref Rng & Units 04/24/2019 04/23/2019 04/23/2019  Glucose 70 - 99 mg/dL 111(H) 125(H) 129(H)  BUN 8 - 23 mg/dL 12 15 13   Creatinine 0.61 - 1.24 mg/dL 1.09 0.97 1.10  BUN/Creat Ratio 6 - 22 (calc) - - -  Sodium 135 - 145 mmol/L 138 133(L) 140  Potassium 3.5 - 5.1 mmol/L 4.0 5.8(H) 4.5  Chloride 98 - 111 mmol/L 103 103 108  CO2 22 - 32 mmol/L 23 19(L) 22  Calcium 8.9 - 10.3 mg/dL 8.9 8.6(L) 8.6(L)    Intake/Output Summary (Last 24 hours) at 04/24/2019 [id number] Last data filed at 04/24/2019 0000 Gross per 24 hour  Intake 667.41 ml  Output 900 ml  Net -232.59 ml    Current Facility-Administered Medications:  .  0.9 %  sodium chloride infusion, , Intravenous, PRN, Judith Part, MD, Last Rate: 10 mL/hr at 04/24/19 0640, 250 mL at 04/24/19 0640 .  acetaminophen (TYLENOL) tablet 650 mg, 650 mg, Oral, Q6H PRN, Spero Geralds, MD .  atorvastatin (LIPITOR) tablet 40 mg, 40 mg, Oral, q1800, Danford, Suann Larry, MD .  Chlorhexidine Gluconate Cloth 2 % PADS 6 each, 6 each, Topical, Daily, Spero Geralds, MD, 6 each at 04/23/19 1236 .  divalproex (DEPAKOTE) DR tablet 250 mg, 250 mg, Oral, QHS, Danford, Christopher P, MD .  dronabinol (MARINOL) capsule 2.5 mg, 2.5  mg, Oral, BID AC, Danford, Christopher P, MD .  labetalol (NORMODYNE) injection 10 mg, 10 mg, Intravenous, Q2H PRN, Spero Geralds, MD .  levETIRAcetam (KEPPRA) tablet 500 mg, 500 mg, Oral, BID, Erlinda Hong, Jindong, MD .  lidocaine (PF) (XYLOCAINE) 1 % injection 5 mL, 5 mL, Intradermal, Once, Spero Geralds, MD .  MEDLINE mouth rinse, 15 mL, Mouth Rinse, BID, Spero Geralds, MD, 15 mL at 04/23/19 1236 .  morphine 2 MG/ML injection 2 mg, 2 mg, Intravenous, Q4H PRN, Spero Geralds, MD .  sodium chloride flush (NS) 0.9 % injection 10-40 mL, 10-40 mL, Intracatheter, Q12H, Spero Geralds, MD, 10 mL at 04/23/19 2207 .  sodium chloride flush (NS) 0.9 % injection 10-40 mL, 10-40 mL, Intracatheter, PRN, Spero Geralds, MD .  sodium chloride flush (NS) 0.9 % injection 3 mL, 3 mL, Intravenous, Once, Spero Geralds, MD   Physical Exam: AOx3, PERRL, b/l XT, FS, Strength 5/5 x4, SILTx4, no drift Proximal LUE erythematous from infiltration demarcation but no edema, normal distal neurovascular function  Assessment & Plan: 69 y.o. man s/p paroxysmal / self-resolved L sided weakness, CTH w/ L subacute SDH.  -Sx still resolved, unlikely to be  related to hematoma, will sign off. Discussed w/ pt, he should follow up in clinic w/ me in 2 weeks. Will place info in pt's discharge instructions in Epic.   Judith Part  04/24/19 8:32 AM

## 2019-04-24 NOTE — Progress Notes (Addendum)
SLP Cancellation Note  Patient Details Name: Ricky Ward MRN: [id number] DOB: 13-Mar-1950   Cancelled treatment:       Reason Eval/Treat Not Completed: Patient at procedure or test/unavailable ST attempted tx x2. On first attempt, patient with Physical Therapy. On second attempt, pt with ECHO. Will continue efforts.  Addendum: ST attempted a third time at 1:40pm. Pt at CT.    Marina Goodell, M.Ed., CCC-SLP Speech Therapy Acute Rehabilitation [phone]: Acute Rehab office [phone] - pager   Marina Goodell 04/24/2019, 10:43 AM

## 2019-04-24 NOTE — Discharge Summary (Addendum)
Physician Discharge Summary  Ricky Ward [id number] DOB: 05/25/1950 DOA: 04/22/2019  PCP: Rosita Fire, MD  Admit date: 04/22/2019 Discharge date: 04/25/2019  Admitted From: ALF  Disposition:  [facility]   Recommendations for Outpatient Follow-up:  1. Follow up with Neurology in 4 weeks 2. Follow up with Dr. Zada Finders Neurosurgery in 2 weeks, as scheduled 3. Dr. Zada Finders: please provide instructions to ALF if patient able to start aspirin 81 mg  4. If patient has not been contacted in 1 week for 30 day cardiac event monitor, please refer to Cardiology 5. Obtain CT chest with contrast as an outpatient   Home Health: PT/OT due to ongoing balance issues -- patient continues to be high fall risk  Equipment/Devices: None  Discharge Condition: Good  CODE STATUS: FULL Diet recommendation: Cardiac  Brief/Interim Summary: Ricky Ward is a 69 y.o. M with Bipolar disorder, hypothyoidism, resident of LTC who presented with fall.  On EMS arrival, noted to have flaccid left arm and leg.  In the ER, [facility] STROKE called.  CT head showed a large subdural hematoma.  Neurology and Neurosurgery were consulted, and he was started on hypertonic saline.      PRINCIPAL HOSPITAL DIAGNOSIS: Acute ischemic stroke    Discharge Diagnoses:   Acute right frontal ischemic infarct Admitted as CODE STROKE, CT head showed left sided SDH. Deficits however (left sided weakness) did not correlate and subsequent MRI showed an acute right frontal lobe infarct.  -Non-invasive angiography and carotid imaging showed no atherosclerotic disease -Echocardiogram showed no cardiogenic source of embolism, normal EF -Lipids ordered -- continue atorvastatin, increase to 40 mg nightly -Aspirin deferred given SDH --> start aspirin 81 mg in 1 week if clinically stable -Atrial fibrillation: not present --> 30 day event monitor requested of Cardiology prior to discharge, this is pending -tPA not given because  SDH -Dysphagia screen completed in ER -NIHSS 7 at admission, in ER -PT eval ordered: recommended no PT follow up -Smoking cessation: not pertinent, non-smoker     Left subdural hematoma Patient noted to have left SDH with slight midline shift.  Started on hypertonic saline initially and Neurology and Neurosurgery consulted.  Both felt his symptoms were unrelated to his SDH, which was felt to be asymptomatic.  He was started on Keppra, which he should take until discontinued by Neurosurgery.  He has Neurosurgery follow up arranged in 2 weeks.    Bipolar disorder Continue Depakote and Seroquel and Paxil.  Dosages and current regimen confirmed with RN at [facility]  AKI Cr 1.6 at admission, resolved to baseline 1.1  GERD Continue Pepcid  Soft tissue mass chest This was noted incidentally on CTA neck.   -Follow up CT chest recommended               Discharge Instructions  Discharge Instructions    Ambulatory referral to Neurology   Complete by: As directed    Follow up with stroke clinic NP (Jessica Vanschaick or Cecille Rubin, if both not available, consider Zachery Dauer, or Ahern) at [facility] in about 4 weeks. Thanks.   Diet - low sodium heart healthy   Complete by: As directed    Discharge instructions   Complete by: As directed    Increase atorvastatin to 40 mg daily Refer patient for 30 day cardiac event monitor Follow up with [facility] Neurology in 4 weeks Follow up with Dr. Zada Finders Neurosurgery in 2 weeks as scheduled already  In 1 week (on or around 3/20) if no  change in clinical status, may start aspirin 81 mg daily  Resume all home medicines   Increase activity slowly   Complete by: As directed    Reason for NOT prescribing anticoagulant therapy at discharge   Complete by: As directed    Reason for not prescribing antIcoagulant at discharge?: Medical Contraindication   Comment: sdh     Allergies as of 04/25/2019      Reactions   Penicillins  Swelling   Has patient had a PCN reaction causing immediate rash, facial/tongue/throat swelling, SOB or lightheadedness with hypotension: Unknown Has patient had a PCN reaction causing severe rash involving mucus membranes or skin necrosis: Unknown Has patient had a PCN reaction that required hospitalization: Unknown Has patient had a PCN reaction occurring within the last 10 years: No If all of the above answers are "NO", then may proceed with Cephalosporin use.      Medication List    TAKE these medications   atorvastatin 40 MG tablet Commonly known as: LIPITOR Take 1 tablet (40 mg total) by mouth daily at 6 PM. What changed:   medication strength  how much to take  when to take this   clotrimazole-betamethasone cream Commonly known as: LOTRISONE Apply 1 application topically 2 (two) times daily as needed (rash).   divalproex 250 MG DR tablet Commonly known as: Depakote Take 1 tablet (250 mg total) by mouth at bedtime.   dronabinol 2.5 MG capsule Commonly known as: MARINOL Take 2.5 mg by mouth 2 (two) times daily before a meal.   Enstilar 0.005-0.064 % Foam Generic drug: Calcipotriene-Betameth Diprop Apply topically. Use as directed   famotidine 20 MG tablet Commonly known as: Pepcid Take 1 tablet (20 mg total) by mouth as needed for heartburn or indigestion (May use once to twice daily). What changed: when to take this   hydrocortisone 2.5 % cream Apply 1 application topically daily. *Apply to face, scalp, neck, and behind the ears   ketoconazole 2 % shampoo Commonly known as: NIZORAL Apply 1 application topically daily.   ketoconazole 2 % cream Commonly known as: NIZORAL Apply 1 application topically daily. Applied to the face, scalp, neck, and behind the ears   levETIRAcetam 500 MG tablet Commonly known as: KEPPRA Take 1 tablet (500 mg total) by mouth 2 (two) times daily.   levothyroxine 88 MCG tablet Commonly known as: Synthroid Take 1 tablet (88 mcg  total) by mouth daily.   Mag-Oxide 200 MG Tabs Generic drug: Magnesium Oxide Take 2 tablets (400 mg total) by mouth in the morning and at bedtime.   PARoxetine 20 MG tablet Commonly known as: PAXIL Take 1 tablet (20 mg total) by mouth daily.   QUEtiapine 400 MG tablet Commonly known as: SEROQUEL Take 1 tablet (400 mg total) by mouth at bedtime.   QUEtiapine 50 MG tablet Commonly known as: SEROQUEL Take 1 tablet (50 mg total) by mouth at bedtime.   sennosides-docusate sodium 8.6-50 MG tablet Commonly known as: SENOKOT-S Take 1 tablet by mouth daily as needed for constipation.      Follow-up Information    [facility]. Schedule an appointment as soon as possible for a visit in 4 week(s).   Specialty: Neurology Contact information: [place] [phone]         Allergies  Allergen Reactions  . Penicillins Swelling    Has patient had a PCN reaction causing immediate rash, facial/tongue/throat swelling, SOB or lightheadedness with hypotension: Unknown Has patient  had a PCN reaction causing severe rash involving mucus membranes or skin necrosis: Unknown Has patient had a PCN reaction that required hospitalization: Unknown Has patient had a PCN reaction occurring within the last 10 years: No If all of the above answers are "NO", then may proceed with Cephalosporin use.     Consultations:  Neurology  Neurosurgery   Procedures/Studies: EEG  Result Date: 04/23/2019 Lora Havens, MD     04/23/2019 11:10 AM Patient Name: Ricky Ward MRN: [id number] Epilepsy Attending: Lora Havens Referring Physician/Provider: Dr Kerney Elbe Date: 04/23/2019 Duration: 23.29 mins Patient history: 69 year old man who presented with symptoms of left-sided weakness and noted to have a left-sided subdural hematoma. EEG to evaluate for seizure Level of alertness: awake AEDs during EEG study: LEV Technical aspects:  This EEG study was done with scalp electrodes positioned according to the 10-20 International system of electrode placement. Electrical activity was acquired at a sampling rate of 500Hz  and reviewed with a high frequency filter of 70Hz  and a low frequency filter of 1Hz . EEG data were recorded continuously and digitally stored. DESCRIPTION: No clear posterior dominant rhythm was seen. EEG showed continuous generalized and maximal right frontotemporal region 6-8Hz  theta slowing. Hyperventilation and photic stimulation were not performed. ABNORMALITY - Continuous slow, generalized and maximal right frontotemporal region IMPRESSION: This study is suggestive of cortical dysfunction and right frontotemporal region, nonspecific to etiology as well as mild diffuse encephalopathy, non specific to etiology. No seizures or epileptiform discharges were seen throughout the recording. Priyanka Barbra Sarks   CT ANGIO HEAD W OR WO CONTRAST  Result Date: 04/24/2019 CLINICAL DATA:  Stroke follow-up. EXAM: CT ANGIOGRAPHY HEAD AND NECK TECHNIQUE: Multidetector CT imaging of the head and neck was performed using the standard protocol during bolus administration of intravenous contrast. Multiplanar CT image reconstructions and MIPs were obtained to evaluate the vascular anatomy. Carotid stenosis measurements (when applicable) are obtained utilizing NASCET criteria, using the distal internal carotid diameter as the denominator. CONTRAST:  159mL OMNIPAQUE IOHEXOL 350 MG/ML SOLN COMPARISON:  Head CT April 22 2019; MRI of the brain April 23, 2019 FINDINGS: CT HEAD FINDINGS Brain: Area of encephalomalacia and gliosis involving the left insula, basal ganglia and frontal operculum. Cortical hypodensity in the right insula, right frontal operculum and anterior left frontal lobe corresponding to infarct seen on prior MRI. No new focal hypodensity. Left convexity subdural hematoma measuring up to 7 mm in thickness in causing a rightward midline  shift of approximately 6 mm appear stable. Ventricular size also remain stable. Vascular: Calcified plaques are seen in the bilateral carotid siphons. Skull: Normal. Negative for fracture or focal lesion. Sinuses: Imaged portions are clear. Orbits: No acute finding. Review of the MIP images confirms the above findings CTA NECK FINDINGS Aortic arch: Common origin of the innominate and left common carotid artery from the aortic arch. Imaged portion shows no evidence of aneurysm or dissection. Calcified plaques are seen in the aortic arch and at the origin of the major neck arteries without stenosis. Right carotid system: There is increased tortuosity at the proximal right common carotid artery and right subclavian artery without significant stenosis. Calcified plaque is seen in the right carotid bifurcation without hemodynamically significant stenosis. There is increased tortuosity of the cervical segment of the right ICA. Left carotid system: There is mildly increased tortuosity of the cervical segment of the left ICA. Otherwise the left common carotid artery, carotid bifurcation and cervical left ICA are maintained. Vertebral arteries: Slight right dominance.  There is no evidence of dissection, stenosis or occlusion. Skeleton: Degenerative changes of the temporomandibular joints more pronounced on the right side. C2-C3 fusion. There is also fusion of the second and third ribs on the left side. Other neck: Asymmetry of the vocal cords. Upper chest: Mild bilateral apical scarring. Partially visualized soft tissue density structure within the upper mediastinum/right paratracheal measuring at least 2.5 cm. Review of the MIP images confirms the above findings CTA HEAD FINDINGS Anterior circulation: Calcified plaques in the bilateral carotid siphons. Mild medial displacement of the left MCA vessels a prominent cortical vein by the known subdural hematoma. No significant stenosis, proximal occlusion, aneurysm, or vascular  malformation. Posterior circulation: No significant stenosis, proximal occlusion, aneurysm, or vascular malformation. Venous sinuses: As permitted by contrast timing, patent. Anatomic variants: None Review of the MIP images confirms the above findings IMPRESSION: 1. Unchanged left convexity subdural hematoma with 6 mm rightward midline shift. 2. Unchanged areas of encephalomalacia and gliosis in the left frontal lobe and insula. 3. Cortical hypodensity within the right insula and bilateral frontal lobes corresponding to acute infarctions seen on prior MRI. 4. No significant stenosis in the major cervical arteries. 5. Mild medial displacement of the left MCA vessels by the known subdural hematoma. No evidence of significant stenosis, proximal occlusion or vascular malformation in the major intracranial arteries. 6. Partially imaged soft tissue density structure within the upper mediastinum/right paratracheal region measuring at least 2.5 cm. Correlation with dedicated chest CT suggested. 7. Asymmetry of the vocal cords. 8. Degenerative changes of the temporomandibular joints more pronounced on the right side. 9. CT angiogram of the head. Aortic Atherosclerosis (ICD10-I70.0). Electronically Signed   By: Pedro Earls M.D.   On: 04/24/2019 15:39   CT ANGIO NECK W OR WO CONTRAST  Result Date: 04/24/2019 CLINICAL DATA:  Stroke follow-up. EXAM: CT ANGIOGRAPHY HEAD AND NECK TECHNIQUE: Multidetector CT imaging of the head and neck was performed using the standard protocol during bolus administration of intravenous contrast. Multiplanar CT image reconstructions and MIPs were obtained to evaluate the vascular anatomy. Carotid stenosis measurements (when applicable) are obtained utilizing NASCET criteria, using the distal internal carotid diameter as the denominator. CONTRAST:  125mL OMNIPAQUE IOHEXOL 350 MG/ML SOLN COMPARISON:  Head CT April 22 2019; MRI of the brain April 23, 2019 FINDINGS: CT HEAD  FINDINGS Brain: Area of encephalomalacia and gliosis involving the left insula, basal ganglia and frontal operculum. Cortical hypodensity in the right insula, right frontal operculum and anterior left frontal lobe corresponding to infarct seen on prior MRI. No new focal hypodensity. Left convexity subdural hematoma measuring up to 7 mm in thickness in causing a rightward midline shift of approximately 6 mm appear stable. Ventricular size also remain stable. Vascular: Calcified plaques are seen in the bilateral carotid siphons. Skull: Normal. Negative for fracture or focal lesion. Sinuses: Imaged portions are clear. Orbits: No acute finding. Review of the MIP images confirms the above findings CTA NECK FINDINGS Aortic arch: Common origin of the innominate and left common carotid artery from the aortic arch. Imaged portion shows no evidence of aneurysm or dissection. Calcified plaques are seen in the aortic arch and at the origin of the major neck arteries without stenosis. Right carotid system: There is increased tortuosity at the proximal right common carotid artery and right subclavian artery without significant stenosis. Calcified plaque is seen in the right carotid bifurcation without hemodynamically significant stenosis. There is increased tortuosity of the cervical segment of the right ICA. Left  carotid system: There is mildly increased tortuosity of the cervical segment of the left ICA. Otherwise the left common carotid artery, carotid bifurcation and cervical left ICA are maintained. Vertebral arteries: Slight right dominance. There is no evidence of dissection, stenosis or occlusion. Skeleton: Degenerative changes of the temporomandibular joints more pronounced on the right side. C2-C3 fusion. There is also fusion of the second and third ribs on the left side. Other neck: Asymmetry of the vocal cords. Upper chest: Mild bilateral apical scarring. Partially visualized soft tissue density structure within the  upper mediastinum/right paratracheal measuring at least 2.5 cm. Review of the MIP images confirms the above findings CTA HEAD FINDINGS Anterior circulation: Calcified plaques in the bilateral carotid siphons. Mild medial displacement of the left MCA vessels a prominent cortical vein by the known subdural hematoma. No significant stenosis, proximal occlusion, aneurysm, or vascular malformation. Posterior circulation: No significant stenosis, proximal occlusion, aneurysm, or vascular malformation. Venous sinuses: As permitted by contrast timing, patent. Anatomic variants: None Review of the MIP images confirms the above findings IMPRESSION: 1. Unchanged left convexity subdural hematoma with 6 mm rightward midline shift. 2. Unchanged areas of encephalomalacia and gliosis in the left frontal lobe and insula. 3. Cortical hypodensity within the right insula and bilateral frontal lobes corresponding to acute infarctions seen on prior MRI. 4. No significant stenosis in the major cervical arteries. 5. Mild medial displacement of the left MCA vessels by the known subdural hematoma. No evidence of significant stenosis, proximal occlusion or vascular malformation in the major intracranial arteries. 6. Partially imaged soft tissue density structure within the upper mediastinum/right paratracheal region measuring at least 2.5 cm. Correlation with dedicated chest CT suggested. 7. Asymmetry of the vocal cords. 8. Degenerative changes of the temporomandibular joints more pronounced on the right side. 9. CT angiogram of the head. Aortic Atherosclerosis (ICD10-I70.0). Electronically Signed   By: Pedro Earls M.D.   On: 04/24/2019 15:39   MR BRAIN WO CONTRAST  Result Date: 04/23/2019 CLINICAL DATA:  Subdural hematoma, stroke follow-up EXAM: MRI HEAD WITHOUT CONTRAST TECHNIQUE: Multiplanar, multiecho pulse sequences of the brain and surrounding structures were obtained without intravenous contrast. COMPARISON:  Head  CT 04/22/2019 FINDINGS: Brain: Left cerebral convexity subdural hematoma is again identified measuring up to 8 mm in thickness. There is minor mass effect on the underlying parenchyma. There are small areas of cortical reduced diffusion in the right frontal lobe and anterior insula. Small chronic right parietal cortical infarcts. Chronic anterior division left MCA territory infarction. Chronic small vessel infarct of the right corona radiata. No hydrocephalus. Vascular: Major vessel flow voids at the skull base are preserved. Skull and upper cervical spine: Normal marrow signal is preserved. Sinuses/Orbits: Paranasal sinuses are aerated. Orbits are unremarkable. Other: Sella is unremarkable.  Mastoid air cells are clear. IMPRESSION: Small acute right frontal MCA territory cortical infarcts. Chronic small right parietal cortical infarcts, left frontal infarct, and small vessel infarct of the right corona radiata. Left cerebral convexity subdural hematoma without significant mass effect. Electronically Signed   By: Macy Mis M.D.   On: 04/23/2019 17:33   DG Hand 2 View Left  Result Date: 04/22/2019 CLINICAL DATA:  Fall with injury to the fingers EXAM: LEFT HAND - 2 VIEW COMPARISON:  None. FINDINGS: No fracture or malalignment. Surgical plate and fixating screws in the distal radius. IMPRESSION: No acute osseous abnormality Electronically Signed   By: Donavan Foil M.D.   On: 04/22/2019 21:39   DG Chest [facility] [address]  Result Date: 04/04/2019 CLINICAL DATA:  69 year old male with history of productive cough for the past 2 days. EXAM: PORTABLE CHEST 1 VIEW COMPARISON:  Chest x-ray 02/06/2019. FINDINGS: Lung volumes are normal. No consolidative airspace disease. No pleural effusions. No evidence of pulmonary edema. Heart size is normal. Large hiatal hernia. Upper mediastinal contours are within normal limits. Aortic atherosclerosis. IMPRESSION: 1. No radiographic evidence of acute cardiopulmonary disease. 2.  Large hiatal hernia. 3. Aortic atherosclerosis. Electronically Signed   By: Vinnie Langton M.D.   On: 04/04/2019 10:33   EEG adult  Result Date: 04/23/2019 Lora Havens, MD     04/23/2019 11:07 AM Patient Name: Ricky Ward MRN: [id number] Epilepsy Attending: Lora Havens Referring Physician/Provider: Dr Amie [place] Date: 04/22/2019 Duration: 24.38 mins  Patient history: 69 year old man who presented with symptoms of left-sided weakness and noted to have a left-sided subdural hematoma. EEG to evaluate for seizure  Level of alertness: awake  AEDs during EEG study: LEV  Technical aspects: This EEG study was done with scalp electrodes positioned according to the 10-20 International system of electrode placement. Electrical activity was acquired at a sampling rate of 500Hz  and reviewed with a high frequency filter of 70Hz  and a low frequency filter of 1Hz . EEG data were recorded continuously and digitally stored.  DESCRIPTION: No clear posterior dominant rhythm was seen. EEG showed continuous generalized 6-8Hz  theta slowing. Hyperventilation and photic stimulation were not performed.  ABNORMALITY - Continuous slow, generalized  IMPRESSION: This study is suggestive of moderate diffuse encephalopathy, non specific to etiology. No seizures or epileptiform discharges were seen throughout the recording.  Lora Havens   ECHOCARDIOGRAM COMPLETE  Result Date: 04/24/2019    ECHOCARDIOGRAM REPORT   Patient Name:   Ricky Ward Date of Exam: 04/24/2019 Medical Rec #:  [id number]       Height:       67.0 in Accession #:    [id number]      Weight:       169.5 lb Date of Birth:  07/07/1950        BSA:          1.885 m Patient Age:    24 years        BP:           121/76 mmHg Patient Gender: M               HR:           62 bpm. Exam Location:  Inpatient Procedure: 2D Echo Indications:    stroke 434.91  History:        Patient has no prior history of Echocardiogram examinations.                 Risk  Factors:Dyslipidemia and Former Smoker.  Sonographer:    Jannett Celestine RDCS (AE) Referring Phys: [id number] Rosalin Hawking IMPRESSIONS  1. Left ventricular ejection fraction, by estimation, is 55 to 60%. The left ventricle has normal function. The left ventricle has no regional wall motion abnormalities. Left ventricular diastolic parameters are consistent with Grade I diastolic dysfunction (impaired relaxation).  2. Right ventricular systolic function is normal. The right ventricular size is normal. There is mildly elevated pulmonary artery systolic pressure.  3. The mitral valve is normal in structure. No evidence of mitral valve regurgitation. No evidence of mitral stenosis.  4. The aortic valve is normal in structure. Aortic valve regurgitation is not visualized. No aortic stenosis is present. FINDINGS  Left  Ventricle: Left ventricular ejection fraction, by estimation, is 55 to 60%. The left ventricle has normal function. The left ventricle has no regional wall motion abnormalities. The left ventricular internal cavity size was normal in size. There is  no left ventricular hypertrophy. Left ventricular diastolic parameters are consistent with Grade I diastolic dysfunction (impaired relaxation). Right Ventricle: The right ventricular size is normal. No increase in right ventricular wall thickness. Right ventricular systolic function is normal. There is mildly elevated pulmonary artery systolic pressure. The tricuspid regurgitant velocity is 2.73  m/s, and with an assumed right atrial pressure of 10 mmHg, the estimated right ventricular systolic pressure is [id number]. Left Atrium: Left atrial size was normal in size. Right Atrium: Right atrial size was normal in size. Pericardium: There is no evidence of pericardial effusion. Mitral Valve: The mitral valve is normal in structure. No evidence of mitral valve regurgitation. No evidence of mitral valve stenosis. Tricuspid Valve: The tricuspid valve is normal in structure.  Tricuspid valve regurgitation is trivial. No evidence of tricuspid stenosis. Aortic Valve: The aortic valve is normal in structure. Aortic valve regurgitation is not visualized. No aortic stenosis is present. Pulmonic Valve: The pulmonic valve was normal in structure. Pulmonic valve regurgitation is not visualized. No evidence of pulmonic stenosis. Aorta: The aortic root and ascending aorta are structurally normal, with no evidence of dilitation. IAS/Shunts: The atrial septum is grossly normal.  LEFT VENTRICLE PLAX 2D LVIDd:         3.20 cm  Diastology LVIDs:         2.40 cm  LV e' lateral:   5.98 cm/s LV PW:         0.70 cm  LV E/e' lateral: 15.8 LV IVS:        0.90 cm  LV e' medial:    6.74 cm/s LVOT diam:     2.00 cm  LV E/e' medial:  14.0 LV SV:         45 LV SV Index:   24 LVOT Area:     3.14 cm  RIGHT VENTRICLE RV S prime:     14.40 cm/s TAPSE (M-mode): 1.4 cm LEFT ATRIUM             Index       RIGHT ATRIUM           Index LA diam:        2.50 cm 1.33 cm/m  RA Area:     10.20 cm LA Vol (A2C):   42.8 ml 22.70 ml/m RA Volume:   19.30 ml  10.24 ml/m LA Vol (A4C):   27.5 ml 14.59 ml/m LA Biplane Vol: 34.7 ml 18.41 ml/m  AORTIC VALVE LVOT Vmax:   83.50 cm/s LVOT Vmean:  58.600 cm/s LVOT VTI:    0.143 m  AORTA Ao Root diam: 2.80 cm MITRAL VALVE                TRICUSPID VALVE MV Area (PHT): 2.93 cm     TR Peak grad:   29.8 mmHg MV Decel Time: 259 msec     TR Vmax:        273.00 cm/s MV E velocity: 94.60 cm/s MV A velocity: 130.00 cm/s  SHUNTS MV E/A ratio:  0.73         Systemic VTI:  0.14 m  Systemic Diam: 2.00 cm Mertie Moores MD Electronically signed by Mertie Moores MD Signature Date/Time: 04/24/2019/12:49:29 PM    Final    CT HEAD CODE STROKE WO CONTRAST  Addendum Date: 04/22/2019   ADDENDUM REPORT: 04/22/2019 19:21 ADDENDUM: Study discussed by telephone with Dr. Rory Percy on 04/22/2019 at 1916 hours. Electronically Signed   By: Genevie Ann M.D.   On: 04/22/2019 19:21   Result  Date: 04/22/2019 CLINICAL DATA:  Code stroke. 69 year old male with left side weakness and slurred speech. EXAM: CT HEAD WITHOUT CONTRAST TECHNIQUE: Contiguous axial images were obtained from the base of the skull through the vertex without intravenous contrast. COMPARISON:  Report of [facility] noncontrast head CT 04/20/2013 (no images available). FINDINGS: Brain: Left side mixed density but mostly hypodense subdural hematoma throughout much of the hemisphere measuring up to 7 mm in thickness (series 2, image 23 and coronal image 34). Small areas of hyperdense blood products within the subdural collection. Intracranial mass effect with rightward midline shift of 6-7 mm. Mild mass effect on the left lateral ventricle. No ventriculomegaly. Basilar cisterns remain patent. Superimposed chronic appearing cortical encephalomalacia affecting the anterior left insula and inferior frontal gyrus (coronal image 26). Patchy hemisphere white matter hypodensity elsewhere. No other intracranial hemorrhage identified. No superimposed cortically based acute infarct identified. Vascular: Calcified atherosclerosis at the skull base. No suspicious intracranial vascular hyperdensity. Skull: No skull fracture identified. No acute osseous abnormality identified. Sinuses/Orbits: Visualized paranasal sinuses and mastoids are clear. Other: Disconjugate gaze. No acute orbit or scalp soft tissue finding. ASPECTS [facility] Stroke Program Early CT Score) Total score (0-10 with 10 being normal): 10 (chronic left MCA infarct). IMPRESSION: 1. Positive for left side Subdural Hematoma measuring up to 7 mm in thickness. Intracranial mass effect with rightward midline shift of 7 mm. 2. Underlying chronic anterior division Left MCA infarct. No acute cortically based infarct identified. 3. These results were communicated to Dr. Lorraine Lax at 7:13 pm on 04/22/2019 by text page via the [facility] messaging system.  Electronically Signed: By: Genevie Ann M.D. On: 04/22/2019 19:15   [place] EKG SITE RITE  Result Date: 04/22/2019 If Site Rite image not attached, placement could not be confirmed due to current cardiac rhythm.     Subjective: Feeling well.  Hand still a little sore, but no fever, chills.  No left arm weakness.  No new confusion or diminished awareness.  Discharge Exam: Vitals:   04/24/19 2322 04/25/19 0402  BP: 122/76 127/76  Pulse: 76 76  Resp: 16 18  Temp: 98.7 F (37.1 C) 98.7 F (37.1 C)  SpO2: 96% 100%   Vitals:   04/24/19 1558 04/24/19 1927 04/24/19 2322 04/25/19 0402  BP: 132/69 103/72 122/76 127/76  Pulse: 80 79 76 76  Resp: 14 18 16 18   Temp: 98.7 F (37.1 C) 98.4 F (36.9 C) 98.7 F (37.1 C) 98.7 F (37.1 C)  TempSrc: Oral Oral Oral Oral  SpO2: 94% 94% 96% 100%  Weight:      Height:        General: Pt is alert, awake, not in acute distress, washing at sink, standing with OT Cardiovascular: RRR, nl S1-S2, no murmurs appreciated.   No LE edema.   Respiratory: Normal respiratory rate and rhythm.  CTAB without rales or wheezes. Abdominal: Abdomen soft and non-tender.  No distension or HSM.   Neuro/Psych: Strength symmetric in upper and lower extremities.  Judgment and insight appear impaired, at baseline.   The results  of significant diagnostics from this hospitalization (including imaging, microbiology, ancillary and laboratory) are listed below for reference.     Microbiology: Recent Results (from the past 240 hour(s))  Respiratory Panel by RT PCR (Flu A&B, Covid) - Nasopharyngeal Swab     Status: None   Collection Time: 04/22/19 10:06 PM   Specimen: Nasopharyngeal Swab  Result Value Ref Range Status   SARS Coronavirus 2 by RT PCR NEGATIVE NEGATIVE Final    Comment: (NOTE) SARS-CoV-2 target nucleic acids are NOT DETECTED. The SARS-CoV-2 RNA is generally detectable in upper respiratoy specimens during the acute phase of infection. The lowest concentration of  SARS-CoV-2 viral copies this assay can detect is 131 copies/mL. A negative result does not preclude SARS-Cov-2 infection and should not be used as the sole basis for treatment or other patient management decisions. A negative result may occur with  improper specimen collection/handling, submission of specimen other than nasopharyngeal swab, presence of viral mutation(s) within the areas targeted by this assay, and inadequate number of viral copies (<131 copies/mL). A negative result must be combined with clinical observations, patient history, and epidemiological information. The expected result is Negative. Fact Sheet for Patients:  PinkCheek.be Fact Sheet for Healthcare Providers:  GravelBags.it This test is not yet ap proved or cleared by the [place] FDA and  has been authorized for detection and/or diagnosis of SARS-CoV-2 by FDA under an Emergency Use Authorization (EUA). This EUA will remain  in effect (meaning this test can be used) for the duration of the COVID-19 declaration under Section 564(b)(1) of the Act, 21 U.S.C. section 360bbb-3(b)(1), unless the authorization is terminated or revoked sooner.    Influenza A by PCR NEGATIVE NEGATIVE Final   Influenza B by PCR NEGATIVE NEGATIVE Final    Comment: (NOTE) The Xpert Xpress SARS-CoV-2/FLU/RSV assay is intended as an aid in  the diagnosis of influenza from Nasopharyngeal swab specimens and  should not be used as a sole basis for treatment. Nasal washings and  aspirates are unacceptable for Xpert Xpress SARS-CoV-2/FLU/RSV  testing. Fact Sheet for Patients: PinkCheek.be Fact Sheet for Healthcare Providers: GravelBags.it This test is not yet approved or cleared by the [place] FDA and  has been authorized for detection and/or diagnosis of SARS-CoV-2 by  FDA under an Emergency Use Authorization (EUA).  This EUA will remain  in effect (meaning this test can be used) for the duration of the  Covid-19 declaration under Section 564(b)(1) of the Act, 21  U.S.C. section 360bbb-3(b)(1), unless the authorization is  terminated or revoked. Performed at [facility], [place] [address] [place], [place] [address]   MRSA PCR Screening     Status: None   Collection Time: 04/23/19  3:42 AM   Specimen: Nasal Mucosa; Nasopharyngeal  Result Value Ref Range Status   MRSA by PCR NEGATIVE NEGATIVE Final    Comment:        The GeneXpert MRSA Assay (FDA approved for NASAL specimens only), is one component of a comprehensive MRSA colonization surveillance program. It is not intended to diagnose MRSA infection nor to guide or monitor treatment for MRSA infections. Performed at [facility], [place] [address] [place], [place] [address]      Labs: BNP (last 3 results) No results for input(s): BNP in the last 8760 hours. Basic Metabolic Panel: Recent Labs  Lab 04/23/19 0133 04/23/19 0619 04/23/19 0753 04/23/19 1027 04/23/19 1428 04/23/19 2134 04/24/19 0432  NA 138   < > 141 143 140  133* 138  K 5.2*   < > 4.3 4.4 4.5 5.8* 4.0  CL 105   < > 108 111 108 103 103  CO2 23   < > 24 23 22  19* 23  GLUCOSE 128*   < > 111* 110* 129* 125* 111*  BUN 16   < > 13 13 13 15 12   CREATININE 1.24   < > 1.03 1.09 1.10 0.97 1.09  CALCIUM 8.5*   < > 8.5* 8.6* 8.6* 8.6* 8.9  MG 2.3  --  2.4  --   --   --   --   PHOS 2.6  --  2.5  --   --   --   --    < > = values in this interval not displayed.   Liver Function Tests: Recent Labs  Lab 04/22/19 1911  AST 27  ALT 18  ALKPHOS 58  BILITOT 0.5  PROT 7.4  ALBUMIN 3.2*   No results for input(s): LIPASE, AMYLASE in the last 168 hours. No results for input(s): AMMONIA in the last 168 hours. CBC: Recent Labs  Lab 04/22/19 1911 04/22/19 1924 04/23/19 0133 04/23/19 0753  WBC 6.4  --  7.1 7.5  NEUTROABS 3.0  --   --   --   HGB 13.0 13.6 12.8* 13.3   HCT 40.3 40.0 40.3 40.1  MCV 99.8  --  99.5 96.6  PLT 378  --  396 398   Cardiac Enzymes: No results for input(s): CKTOTAL, CKMB, CKMBINDEX, TROPONINI in the last 168 hours. BNP: Invalid input(s): POCBNP CBG: Recent Labs  Lab 04/23/19 0352  GLUCAP 100*   D-Dimer No results for input(s): DDIMER in the last 72 hours. Hgb A1c Recent Labs    04/24/19 0753  HGBA1C 5.8*   Lipid Profile Recent Labs    04/24/19 0753  CHOL 128  HDL 32*  LDLCALC 76  TRIG 100  CHOLHDL 4.0   Thyroid function studies Recent Labs    04/23/19 0133  TSH 0.739   Anemia work up No results for input(s): VITAMINB12, FOLATE, FERRITIN, TIBC, IRON, RETICCTPCT in the last 72 hours. Urinalysis    Component Value Date/Time   COLORURINE YELLOW 04/23/2019 2230   APPEARANCEUR CLEAR 04/23/2019 2230   LABSPEC 1.014 04/23/2019 2230   PHURINE 7.0 04/23/2019 2230   GLUCOSEU NEGATIVE 04/23/2019 2230   HGBUR NEGATIVE 04/23/2019 2230   BILIRUBINUR NEGATIVE 04/23/2019 2230   KETONESUR NEGATIVE 04/23/2019 2230   PROTEINUR NEGATIVE 04/23/2019 2230   UROBILINOGEN 1.0 04/15/2013 2138   NITRITE NEGATIVE 04/23/2019 2230   LEUKOCYTESUR NEGATIVE 04/23/2019 2230   Sepsis Labs Invalid input(s): PROCALCITONIN,  WBC,  LACTICIDVEN Microbiology Recent Results (from the past 240 hour(s))  Respiratory Panel by RT PCR (Flu A&B, Covid) - Nasopharyngeal Swab     Status: None   Collection Time: 04/22/19 10:06 PM   Specimen: Nasopharyngeal Swab  Result Value Ref Range Status   SARS Coronavirus 2 by RT PCR NEGATIVE NEGATIVE Final    Comment: (NOTE) SARS-CoV-2 target nucleic acids are NOT DETECTED. The SARS-CoV-2 RNA is generally detectable in upper respiratoy specimens during the acute phase of infection. The lowest concentration of SARS-CoV-2 viral copies this assay can detect is 131 copies/mL. A negative result does not preclude SARS-Cov-2 infection and should not be used as the sole basis for treatment or other  patient management decisions. A negative result may occur with  improper specimen collection/handling, submission of specimen other than nasopharyngeal swab, presence of viral mutation(s) within  the areas targeted by this assay, and inadequate number of viral copies (<131 copies/mL). A negative result must be combined with clinical observations, patient history, and epidemiological information. The expected result is Negative. Fact Sheet for Patients:  PinkCheek.be Fact Sheet for Healthcare Providers:  GravelBags.it This test is not yet ap proved or cleared by the [place] FDA and  has been authorized for detection and/or diagnosis of SARS-CoV-2 by FDA under an Emergency Use Authorization (EUA). This EUA will remain  in effect (meaning this test can be used) for the duration of the COVID-19 declaration under Section 564(b)(1) of the Act, 21 U.S.C. section 360bbb-3(b)(1), unless the authorization is terminated or revoked sooner.    Influenza A by PCR NEGATIVE NEGATIVE Final   Influenza B by PCR NEGATIVE NEGATIVE Final    Comment: (NOTE) The Xpert Xpress SARS-CoV-2/FLU/RSV assay is intended as an aid in  the diagnosis of influenza from Nasopharyngeal swab specimens and  should not be used as a sole basis for treatment. Nasal washings and  aspirates are unacceptable for Xpert Xpress SARS-CoV-2/FLU/RSV  testing. Fact Sheet for Patients: PinkCheek.be Fact Sheet for Healthcare Providers: GravelBags.it This test is not yet approved or cleared by the [place] FDA and  has been authorized for detection and/or diagnosis of SARS-CoV-2 by  FDA under an Emergency Use Authorization (EUA). This EUA will remain  in effect (meaning this test can be used) for the duration of the  Covid-19 declaration under Section 564(b)(1) of the Act, 21  U.S.C. section 360bbb-3(b)(1), unless  the authorization is  terminated or revoked. Performed at [facility], [place] [address] [place], [place] [address]   MRSA PCR Screening     Status: None   Collection Time: 04/23/19  3:42 AM   Specimen: Nasal Mucosa; Nasopharyngeal  Result Value Ref Range Status   MRSA by PCR NEGATIVE NEGATIVE Final    Comment:        The GeneXpert MRSA Assay (FDA approved for NASAL specimens only), is one component of a comprehensive MRSA colonization surveillance program. It is not intended to diagnose MRSA infection nor to guide or monitor treatment for MRSA infections. Performed at [facility], [place] [address] [place], [place] [address]      Time coordinating discharge: 37minutes      SIGNED:   Edwin Dada, MD  [facility] 04/25/2019, 9:30 AM

## 2019-04-24 NOTE — Evaluation (Signed)
Physical Therapy Evaluation Patient Details Name: Ricky Ward MRN: [id number] DOB: 05/28/1950 Today's Date: 04/24/2019   History of Present Illness  Pt is 69 yo male who presented with L sided weakness after a fall.  Pt found to have large L hemispheric subdural hematoma with rightward midline shift of 7 mm, L finger laceration, and small acute R frontal MCA infarcts.  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 Per neurosurgy note - symptoms resolving.  Pt with PMH including bipolar, hypothyroidism, hyperlipidemia.  Clinical Impression  Pt admitted with above diagnosis. Pt reports that he feels close to his baseline.  Pt demonstrated equal strength bilaterally.  He was able to ambulate 400' with supervision/min guard but with decreased gait speed and fatigue in the last 100'.  Pt's Berg balance score indicates Fall Risk.  Will benefit from acute PT to advance mobility/safety while hospitalized.  Pt currently with functional limitations due to the deficits listed below (see PT Problem List). Pt will benefit from skilled PT to increase their independence and safety with mobility to allow discharge to the venue listed below.       Follow Up Recommendations No PT follow up(return to ALF)    Equipment Recommendations  None recommended by PT    Recommendations for Other Services       Precautions / Restrictions Precautions Precautions: Fall Restrictions Weight Bearing Restrictions: No      Mobility  Bed Mobility Overal bed mobility: Needs Assistance Bed Mobility: Supine to Sit;Sit to Supine     Supine to sit: Supervision Sit to supine: Supervision      Transfers Overall transfer level: Needs assistance Equipment used: None Transfers: Sit to/from [employer] Transfers Sit to Stand: Supervision Stand pivot transfers: Supervision          Ambulation/Gait Ambulation/Gait assistance: Min guard Gait Distance (Feet): 400 Feet Assistive device: None Gait  Pattern/deviations: Decreased stride length Gait velocity: decreased   General Gait Details: Demonstrated slow but steady gait.  As he fatigued, speed decreased.  Pt was able to look up/down/L/R, read door signs, step over objects, count up by 3's, stop, and turn without LOB.  Able to change speeds but minimal change in speeds.  Stairs            Wheelchair Mobility    Modified Rankin (Stroke Patients Only)       Balance Overall balance assessment: Independent         Standing balance support: During functional activity;No upper extremity supported Standing balance-Leahy Scale: Good                   Standardized Balance Assessment Standardized Balance Assessment : Berg Balance Test Berg Balance Test Sit to Stand: Able to stand  independently using hands Standing Unsupported: Able to stand safely 2 minutes Sitting with Back Unsupported but Feet Supported on Floor or Stool: Able to sit safely and securely 2 minutes Stand to Sit: Sits safely with minimal use of hands Transfers: Able to transfer safely, minor use of hands Standing Unsupported with Eyes Closed: Able to stand 10 seconds safely Standing Ubsupported with Feet Together: Able to place feet together independently and stand 1 minute safely From Standing, Reach Forward with Outstretched Arm: Can reach forward >12 cm safely (5") From Standing Position, Pick up Object from Floor: Able to pick up shoe, needs supervision From Standing Position, Turn to Look Behind Over each Shoulder: Looks behind from both sides and weight shifts well Turn 360 Degrees: Able  to turn 360 degrees safely but slowly Standing Unsupported, Alternately Place Feet on Step/Stool: Able to complete >2 steps/needs minimal assist Standing Unsupported, One Foot in Front: Needs help to step but can hold 15 seconds Standing on One Leg: Tries to lift leg/unable to hold 3 seconds but remains standing independently Total Score: 42          Pertinent Vitals/Pain Pain Assessment: No/denies pain    Home Living Family/patient expects to be discharged to:: Assisted living Living Arrangements: Other (Comment)(roomate) Available Help at Discharge: Available PRN/intermittently;Other (Comment)(staff at ALF) Type of Home: Assisted living Home Access: Level entry       Home Equipment: [place] - 4 wheels;Shower seat      Prior Function     Gait / Transfers Assistance Needed: No AD used; reports could walk at facility independently, walked outside, 1-2 falls in past year  ADL's / Homemaking Assistance Needed: Has assist with showers and dressing but does toileting independently.        Hand Dominance        Extremity/Trunk Assessment   Upper Extremity Assessment Upper Extremity Assessment: Overall WFL for tasks assessed    Lower Extremity Assessment Lower Extremity Assessment: Overall WFL for tasks assessed(MMT 5/5, ROM WNL, coordination WNL, Sensation intact)    Cervical / Trunk Assessment Cervical / Trunk Assessment: Normal  Communication   Communication: No difficulties  Cognition Arousal/Alertness: Awake/alert Behavior During Therapy: WFL for tasks assessed/performed Overall Cognitive Status: Within Functional Limits for tasks assessed                                        General Comments General comments (skin integrity, edema, etc.): Pt noted to have R eye deviation - reports this is baseline; EOEM were intact; visual fields intact    Exercises     Assessment/Plan    PT Assessment Patient needs continued PT services  PT Problem List Decreased mobility;Decreased safety awareness;Decreased activity tolerance;Decreased balance;Decreased knowledge of use of DME       PT Treatment Interventions Balance training;Gait training;Functional mobility training;Therapeutic activities;Therapeutic exercise;Patient/family education;DME instruction;Neuromuscular re-education    PT Goals  (Current goals can be found in the Care Plan section)  Acute Rehab PT Goals Patient Stated Goal: return home with ALF staff to assist PT Goal Formulation: With patient Time For Goal Achievement: 05/08/19 Potential to Achieve Goals: Good    Frequency Min 4X/week   Barriers to discharge        Co-evaluation               AM-PAC PT "6 Clicks" Mobility  Outcome Measure Help needed turning from your back to your side while in a flat bed without using bedrails?: None Help needed moving from lying on your back to sitting on the side of a flat bed without using bedrails?: None Help needed moving to and from a bed to a chair (including a wheelchair)?: None Help needed standing up from a chair using your arms (e.g., wheelchair or bedside chair)?: None Help needed to walk in hospital room?: None Help needed climbing 3-5 steps with a railing? : A Little 6 Click Score: 23    End of Session Equipment Utilized During Treatment: Gait belt Activity Tolerance: Patient tolerated treatment well Patient left: in chair;with call bell/phone within reach(RN in room changing bed and then to assist pt back to bed for echo) Nurse Communication: Mobility status  PT Visit Diagnosis: Unsteadiness on feet (R26.81);Other abnormalities of gait and mobility (R26.89)    Time: [id number] PT Time Calculation (min) (ACUTE ONLY): 28 min   Charges:   PT Evaluation $PT Eval Moderate Complexity: 1 Mod          Maggie Font, PT Acute Rehab Services Pager [phone] [facility] [phone] [facility] [phone]   Karlton Lemon 04/24/2019, 10:33 AM

## 2019-04-24 NOTE — NC FL2 (Addendum)
[place] LEVEL OF CARE SCREENING TOOL     IDENTIFICATION  Patient Name: Ricky Ward Birthdate: 1951-01-04 Sex: male Admission Date (Current Location): 04/22/2019  [facility] and [place] Number:  [employer] and Address:  [facility] [place], [place] [address], [place], [place] [address]      Provider Number: [id number]  Attending Physician Name and Address:  Edwin Dada, *  Relative Name and Phone Number:       Current Level of Care: Hospital Recommended Level of Care: [place] Prior Approval Number:    Date Approved/Denied:   PASRR Number:    Discharge Plan: Domiciliary (Rest home)(Assisted Living)    Current Diagnoses: Patient Active Problem List   Diagnosis Date Noted  . Acute ischemic stroke ([place]) 04/24/2019  . Subdural hemorrhage ([place]) 04/22/2019  . BPH (benign prostatic hyperplasia) 04/07/2019  . Urinary retention due to benign prostatic hyperplasia 04/07/2019  . Bipolar 1 disorder ([place])   . Aspiration pneumonia ([place]) 04/04/2019  . Acute respiratory failure with hypoxia ([place]) 04/04/2019  . Dysphagia 04/04/2019  . Hypothyroidism 04/04/2019  . HLD (hyperlipidemia) 04/04/2019  . Heartburn 03/12/2019  . Preventative health care 04/28/2018  . Alcohol use disorder, mild, in sustained remission 10/09/2017    Orientation RESPIRATION BLADDER Height & Weight     Self, Time, Situation, Place  Normal Continent Weight: 76.9 kg Height:  5\' 7"  (170.2 cm)  BEHAVIORAL SYMPTOMS/MOOD NEUROLOGICAL BOWEL NUTRITION STATUS      Continent Diet(regular diet with thin liquids)  AMBULATORY STATUS COMMUNICATION OF NEEDS Skin   Limited Assist Verbally Bruising(bruising to bilateral ams and hands)                       Personal Care Assistance Level of Assistance  Bathing, Feeding, Dressing Bathing Assistance: Limited assistance Feeding assistance: Independent Dressing Assistance: Limited assistance      Functional Limitations Info  Sight, Hearing, Speech Sight Info: Adequate Hearing Info: Adequate Speech Info: Adequate    SPECIAL CARE FACTORS FREQUENCY                       Contractures Contractures Info: Not present    Additional Factors Info  Code Status, Allergies, Psychotropic Code Status Info: Full Allergies Info: Penicillin Psychotropic Info: Depakote DR 250mg  at Bedtime/ Marinol 2.5 mg BiD/ Keppra 500 mg BID           Discharge Medications: Medication List    TAKE these medications   atorvastatin 40 MG tablet Commonly known as: LIPITOR Take 1 tablet (40 mg total) by mouth daily at 6 PM. What changed:   medication strength  how much to take  when to take this   clotrimazole-betamethasone cream Commonly known as: LOTRISONE Apply 1 application topically 2 (two) times daily as needed (rash).   divalproex 250 MG DR tablet Commonly known as: Depakote Take 1 tablet (250 mg total) by mouth at bedtime.   dronabinol 2.5 MG capsule Commonly known as: MARINOL Take 2.5 mg by mouth 2 (two) times daily before a meal.   Enstilar 0.005-0.064 % Foam Generic drug: Calcipotriene-Betameth Diprop Apply topically. Use as directed   famotidine 20 MG tablet Commonly known as: Pepcid Take 1 tablet (20 mg total) by mouth as needed for heartburn or indigestion (May use once to twice daily). What changed: when to take this   hydrocortisone 2.5 % cream Apply 1 application topically daily. *Apply to face, scalp,  neck, and behind the ears   ketoconazole 2 % shampoo Commonly known as: NIZORAL Apply 1 application topically daily.   ketoconazole 2 % cream Commonly known as: NIZORAL Apply 1 application topically daily. Applied to the face, scalp, neck, and behind the ears   levETIRAcetam 500 MG tablet Commonly known as: KEPPRA Take 1 tablet (500 mg total) by mouth 2 (two) times daily.   levothyroxine 88 MCG tablet Commonly known as: Synthroid Take 1 tablet  (88 mcg total) by mouth daily.   Mag-Oxide 200 MG Tabs Generic drug: Magnesium Oxide Take 2 tablets (400 mg total) by mouth in the morning and at bedtime.   PARoxetine 20 MG tablet Commonly known as: PAXIL Take 1 tablet (20 mg total) by mouth daily.   QUEtiapine 400 MG tablet Commonly known as: SEROQUEL Take 1 tablet (400 mg total) by mouth at bedtime.   QUEtiapine 50 MG tablet Commonly known as: SEROQUEL Take 1 tablet (50 mg total) by mouth at bedtime.   sennosides-docusate sodium 8.6-50 MG tablet Commonly known as: SENOKOT-S Take 1 tablet by mouth daily as needed for constipation.         Relevant Imaging Results:  Relevant Lab Results:   Additional Information SSN [id number]  Pollie Friar, RN

## 2019-04-25 NOTE — NC FL2 (Signed)
[place] LEVEL OF CARE SCREENING TOOL     IDENTIFICATION  Patient Name: Ricky Ward Birthdate: 1950-08-31 Sex: male Admission Date (Current Location): 04/22/2019  [facility] and [place] Number:  Herbal[facility] and Address:  The . [facility], [place] [address] [place], [place] [address]      Provider Number: [id number]  Attending Physician Name and Address:  Edwin Dada, *  Relative Name and Phone Number:       Current Level of Care: Hospital Recommended Level of Care: [place] Prior Approval Number:    Date Approved/Denied:   PASRR Number:    Discharge Plan: Domiciliary (Rest home)(Assisted Living)    Current Diagnoses: Patient Active Problem List   Diagnosis Date Noted  . Acute ischemic stroke ([place]) 04/24/2019  . Subdural hemorrhage ([place]) 04/22/2019  . BPH (benign prostatic hyperplasia) 04/07/2019  . Urinary retention due to benign prostatic hyperplasia 04/07/2019  . Bipolar 1 disorder ([place])   . Aspiration pneumonia ([place]) 04/04/2019  . Acute respiratory failure with hypoxia ([place]) 04/04/2019  . Dysphagia 04/04/2019  . Hypothyroidism 04/04/2019  . HLD (hyperlipidemia) 04/04/2019  . Heartburn 03/12/2019  . Preventative health care 04/28/2018  . Alcohol use disorder, mild, in sustained remission 10/09/2017    Orientation RESPIRATION BLADDER Height & Weight     Self, Time, Situation, Place  Normal Continent Weight: 169 lb 8.5 oz (76.9 kg) Height:  5\' 7"  (170.2 cm)  BEHAVIORAL SYMPTOMS/MOOD NEUROLOGICAL BOWEL NUTRITION STATUS      Continent Diet(regular diet with thin liquids)  AMBULATORY STATUS COMMUNICATION OF NEEDS Skin   Limited Assist Verbally Bruising(bruising to bilateral ams and hands)                       Personal Care Assistance Level of Assistance  Bathing, Feeding, Dressing Bathing Assistance: Limited assistance Feeding assistance: Independent Dressing Assistance: Limited  assistance     Functional Limitations Info  Sight, Hearing, Speech Sight Info: Adequate Hearing Info: Adequate Speech Info: Adequate    SPECIAL CARE FACTORS FREQUENCY  PT (By licensed PT), OT (By licensed OT)     PT Frequency: 5x a week OT Frequency: 5x a week            Contractures Contractures Info: Not present    Additional Factors Info  Code Status, Allergies, Psychotropic Code Status Info: Full Allergies Info: Penicillin Psychotropic Info: Depakote DR 250mg  at Bedtime/ Marinol 2.5 mg BiD/ Keppra 500 mg BID         Current Medications (04/25/2019):  This is the current hospital active medication list Current Facility-Administered Medications  Medication Dose Route Frequency Provider Last Rate Last Admin  . 0.9 %  sodium chloride infusion   Intravenous PRN Judith Part, MD 10 mL/hr at 04/24/19 0640 250 mL at 04/24/19 0640  . acetaminophen (TYLENOL) tablet 650 mg  650 mg Oral Q6H PRN Spero Geralds, MD      . atorvastatin (LIPITOR) tablet 40 mg  40 mg Oral q1800 Edwin Dada, MD   40 mg at 04/24/19 1742  . Chlorhexidine Gluconate Cloth 2 % PADS 6 each  6 each Topical Daily Spero Geralds, MD   6 each at 04/24/19 1101  . divalproex (DEPAKOTE) DR tablet 250 mg  250 mg Oral QHS Edwin Dada, MD   250 mg at 04/24/19 2130  . dronabinol (MARINOL) capsule 2.5 mg  2.5 mg Oral BID AC Danford, Suann Larry, MD  2.5 mg at 04/25/19 0959  . labetalol (NORMODYNE) injection 10 mg  10 mg Intravenous Q2H PRN Spero Geralds, MD      . levETIRAcetam (KEPPRA) tablet 500 mg  500 mg Oral BID Rosalin Hawking, MD   500 mg at 04/25/19 0959  . lidocaine (PF) (XYLOCAINE) 1 % injection 5 mL  5 mL Intradermal Once Spero Geralds, MD      . MEDLINE mouth rinse  15 mL Mouth Rinse BID Spero Geralds, MD   15 mL at 04/25/19 0959  . morphine 2 MG/ML injection 2 mg  2 mg Intravenous Q4H PRN Spero Geralds, MD      . sodium chloride flush (NS) 0.9 % injection 10-40 mL  10-40  mL Intracatheter Q12H Spero Geralds, MD   10 mL at 04/25/19 0959  . sodium chloride flush (NS) 0.9 % injection 10-40 mL  10-40 mL Intracatheter PRN Spero Geralds, MD      . sodium chloride flush (NS) 0.9 % injection 3 mL  3 mL Intravenous Once Spero Geralds, MD         Discharge Medications: Please see discharge summary for a list of discharge medications.  Relevant Imaging Results:  Relevant Lab Results:   Additional Information SSN [id number] [place], [place]

## 2019-04-25 NOTE — Evaluation (Addendum)
Occupational Therapy Evaluation Patient Details Name: ESTES STREET MRN: [id number] DOB: 06-09-1950 Today's Date: 04/25/2019    History of Present Illness Pt is 69 yo male who presented with L sided weakness after a fall.  Pt found to have large L hemispheric subdural hematoma with rightward midline shift of 7 mm, L finger laceration, and small acute R frontal MCA infarcts.  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 Per neurosurgy note - symptoms resolving.  Pt with PMH including bipolar, hypothyroidism, hyperlipidemia.   Clinical Impression   PTA, pt was living at [facility] he reports he had assistance with dressing and bathing but was independent with toileting without use of AD. Pt currently demonstrates decreased stability, activity tolerance, and cognitive limitations. Pt required multimodal cues to locate the sink, while turning pt had LOB requiring external support from sink and support from therapist to stabilize. Pt required minA for grooming at sink level, requiring assistance to locate items on counter and assist for stability. HR 108 to 130 with grooming at sink level. RN aware. Due to decline in current level of function, pt would benefit from acute OT to address established goals to facilitate safe D/C to venue listed below. At this time, recommend HHOT with supervision for mobility follow-up. Pt with anticipated d/c this date, should he remain in acute care, will continue to follow acutely.     Follow Up Recommendations  Home health OT;Supervision - Intermittent(with mobility)    Equipment Recommendations  None recommended by OT    Recommendations for Other Services       Precautions / Restrictions Precautions Precautions: Fall Restrictions Weight Bearing Restrictions: No      Mobility Bed Mobility Overal bed mobility: Needs Assistance Bed Mobility: Supine to Sit     Supine to sit: Supervision     General bed mobility comments: increased time, slow  labored movement  Transfers Overall transfer level: Needs assistance Equipment used: None Transfers: Sit to/from Stand Sit to Stand: Min assist         General transfer comment: minA to steady in standing    Balance Overall balance assessment: Mild deficits observed, not formally tested Sitting-balance support: Feet supported;No upper extremity supported Sitting balance-Leahy Scale: Fair Sitting balance - Comments: fair/good seated at bedside   Standing balance support: During functional activity;Single extremity supported Standing balance-Leahy Scale: Poor Standing balance comment: reliant on single UE support for dynamic balance while standing at sink level                           ADL either performed or assessed with clinical judgement   ADL Overall ADL's : Needs assistance/impaired Eating/Feeding: Modified independent   Grooming: Minimal assistance Grooming Details (indicate cue type and reason): minA to locate items on countertop, minA for initiating task and for pacing and terminating task Upper Body Bathing: Minimal assistance   Lower Body Bathing: Minimal assistance;Sit to/from stand   Upper Body Dressing : Min guard;Sitting   Lower Body Dressing: Minimal assistance;Sit to/from stand   Toilet Transfer: Minimal assistance;Ambulation Toilet Transfer Details (indicate cue type and reason): in room mobility, pt with moderate LOB when turning, pt required external support from environment and from therapist to correct Toileting- Clothing Manipulation and Hygiene: Minimal assistance;Sit to/from stand Toileting - Clothing Manipulation Details (indicate cue type and reason): pt completed posterior care while standing with minA for stability      Functional mobility during ADLs: Minimal assistance  General ADL Comments: minA for stability, pt furniture walking in room, required max cues to locate sink in room (on left side)     Vision Patient Visual  Report: No change from baseline Vision Assessment?: Yes Eye Alignment: Impaired (comment)(R eye fixed laterally able to track to midline) Ocular Range of Motion: Restricted on the right;Restricted on the left;Restricted looking up;Restricted looking down Alignment/Gaze Preference: Within Defined Limits Tracking/Visual Pursuits: Right eye does not track medially;Decreased smoothness of horizontal tracking;Decreased smoothness of vertical tracking;Unable to hold eye position out of midline;Requires cues, head turns, or add eye shifts to track Saccades: Impaired - to be further tested in functional context Convergence: Impaired - to be further tested in functional context Visual Fields: Impaired-to be further tested in functional context Diplopia Assessment: (NA) Depth Perception: Undershoots     Perception     Praxis      Pertinent Vitals/Pain Pain Assessment: No/denies pain     Hand Dominance Right   Extremity/Trunk Assessment Upper Extremity Assessment Upper Extremity Assessment: Overall WFL for tasks assessed   Lower Extremity Assessment Lower Extremity Assessment: Defer to PT evaluation   Cervical / Trunk Assessment Cervical / Trunk Assessment: Normal   Communication Communication Communication: No difficulties   Cognition Arousal/Alertness: Awake/alert Behavior During Therapy: WFL for tasks assessed/performed Overall Cognitive Status: No family/caregiver present to determine baseline cognitive functioning                                 General Comments: pt with difficulty following multistep commands;follows one step commands with increased time;pt reported we are in a building, required increased time to state a hospital, unable to state [place];pt required multimodal cues for sequencing and locating sink in the room   General Comments  HR 102-130 with grooming at sink level    Exercises     Shoulder Instructions      Home Living Family/patient  expects to be discharged to:: Assisted living Living Arrangements: Other (Comment)(roomate) Available Help at Discharge: Available PRN/intermittently;Other (Comment)(staff at ALF) Type of Home: Assisted living Home Access: Level entry           Bathroom Shower/Tub: Walk-in shower   Bathroom Toilet: Handicapped height     Home Equipment: Environmental consultant - 4 wheels;Shower seat   Additional Comments: [place]oushold ambulator without AD, "per patient"      Prior Functioning/Environment Level of Independence: Needs assistance  Gait / Transfers Assistance Needed: No AD used; reports could walk at facility independently, walked outside, 1-2 falls in past year ADL's / Homemaking Assistance Needed: per chart, Has assist with showers and dressing but does toileting independently.            OT Problem List: Decreased strength;Decreased activity tolerance;Impaired balance (sitting and/or standing);Impaired vision/perception;Decreased cognition;Decreased safety awareness;Decreased knowledge of use of DME or AE;Decreased knowledge of precautions      OT Treatment/Interventions: Self-care/ADL training;Therapeutic exercise;DME and/or AE instruction;Therapeutic activities;Cognitive remediation/compensation;Patient/family education;Balance training    OT Goals(Current goals can be found in the care plan section) Acute Rehab OT Goals Patient Stated Goal: return home with ALF staff to assist OT Goal Formulation: With patient Time For Goal Achievement: 05/09/19 Potential to Achieve Goals: Good  OT Frequency: Min 2X/week   Barriers to D/C: Decreased caregiver support  pt living at ALF       Co-evaluation              AM-PAC OT "6 Clicks" Daily Activity  Outcome Measure Help from another person eating meals?: None Help from another person taking care of personal grooming?: A Little Help from another person toileting, which includes using toliet, bedpan, or urinal?: A Little Help from another  person bathing (including washing, rinsing, drying)?: A Little Help from another person to put on and taking off regular upper body clothing?: A Little Help from another person to put on and taking off regular lower body clothing?: A Little 6 Click Score: 19   End of Session Nurse Communication: Mobility status  Activity Tolerance: Patient tolerated treatment well Patient left: in chair;with call bell/phone within reach;with chair alarm set  OT Visit Diagnosis: Unsteadiness on feet (R26.81);Other abnormalities of gait and mobility (R26.89);Muscle weakness (generalized) (M62.81);History of falling (Z91.81);Other symptoms and signs involving cognitive function                Time: [id number] OT Time Calculation (min): 36 min Charges:  OT General Charges $OT Visit: 1 Visit OT Evaluation $OT Eval Moderate Complexity: 1 Mod OT Treatments $Therapeutic Activity: 8-22 mins  Helene Kelp OTR/L Acute Rehabilitation Services Office: [place] 04/25/2019, 9:58 AM

## 2019-04-25 NOTE — Progress Notes (Addendum)
Report called to [facility] at [phone]. Report given to facility director [place].

## 2019-04-25 NOTE — Plan of Care (Signed)

## 2019-04-25 NOTE — TOC Transition Note (Signed)
Transition of Care [facility]) - CM/SW Discharge Note   Patient Details  Name: GLADSTONE DOMIN MRN: [id number] Date of Birth: [birth year]  [facility] CM/SW Contact:  Arvella Merles, LCSW Phone Number: 04/25/2019, 11:26 AM   Clinical Narrative:    Patient will DC to: [facility] Anticipated DC date: 04/25/19 Family notified: Narda Rutherford, sister Transport by: Corey Harold   Per MD patient ready for DC to [facility]. RN, patient, patient's family, and facility notified of DC. Discharge Summary and FL2 sent to facility via fax. CSW contacted [facility] for [facility] orders.  RN to call report prior to discharge [phone]). DC packet on chart. Ambulance transport requested for patient.   CSW will sign off for now as social work intervention is no longer needed. Please consult [place] again if new needs arise.      Barriers to Discharge: Continued Medical Work up   Patient Goals and CMS Choice     Choice offered to / list presented to : Patient  Discharge Placement                       Discharge Plan and Services In-house Referral: Clinical Social Work Discharge Planning Services: CM Consult Post Acute Care Choice: (long term assisted living)                               Social Determinants of Health (SDOH) Interventions     Readmission Risk Interventions No flowsheet data found.

## 2019-04-27 DIAGNOSIS — J690 Pneumonitis due to inhalation of food and vomit: Secondary | ICD-10-CM | POA: Diagnosis not present

## 2019-04-27 DIAGNOSIS — Z8616 Personal history of COVID-19: Secondary | ICD-10-CM | POA: Diagnosis not present

## 2019-04-27 DIAGNOSIS — R1310 Dysphagia, unspecified: Secondary | ICD-10-CM | POA: Diagnosis not present

## 2019-04-27 DIAGNOSIS — M6281 Muscle weakness (generalized): Secondary | ICD-10-CM | POA: Diagnosis not present

## 2019-04-27 DIAGNOSIS — F3172 Bipolar disorder, in full remission, most recent episode hypomanic: Secondary | ICD-10-CM | POA: Diagnosis not present

## 2019-04-28 ENCOUNTER — Other Ambulatory Visit: Payer: Self-pay | Admitting: Cardiology

## 2019-04-28 DIAGNOSIS — Z8616 Personal history of COVID-19: Secondary | ICD-10-CM | POA: Diagnosis not present

## 2019-04-28 DIAGNOSIS — J690 Pneumonitis due to inhalation of food and vomit: Secondary | ICD-10-CM | POA: Diagnosis not present

## 2019-04-28 DIAGNOSIS — R1310 Dysphagia, unspecified: Secondary | ICD-10-CM | POA: Diagnosis not present

## 2019-04-28 DIAGNOSIS — M6281 Muscle weakness (generalized): Secondary | ICD-10-CM | POA: Diagnosis not present

## 2019-04-28 DIAGNOSIS — F3172 Bipolar disorder, in full remission, most recent episode hypomanic: Secondary | ICD-10-CM | POA: Diagnosis not present

## 2019-04-28 DIAGNOSIS — W19XXXD Unspecified fall, subsequent encounter: Secondary | ICD-10-CM | POA: Diagnosis not present

## 2019-04-28 DIAGNOSIS — I4891 Unspecified atrial fibrillation: Secondary | ICD-10-CM

## 2019-04-28 DIAGNOSIS — S065X0S Traumatic subdural hemorrhage without loss of consciousness, sequela: Secondary | ICD-10-CM | POA: Diagnosis not present

## 2019-04-28 DIAGNOSIS — E038 Other specified hypothyroidism: Secondary | ICD-10-CM | POA: Diagnosis not present

## 2019-04-28 DIAGNOSIS — F319 Bipolar disorder, unspecified: Secondary | ICD-10-CM | POA: Diagnosis not present

## 2019-04-29 ENCOUNTER — Telehealth: Payer: Self-pay | Admitting: *Deleted

## 2019-04-29 NOTE — Telephone Encounter (Signed)
Patient enrolled for [employer] to ship a 30 day cardiac event monitor to [facility], Attn Sunday Spillers ( floor supervisor)  [address], [place],  [address] [phone].

## 2019-04-30 DIAGNOSIS — J690 Pneumonitis due to inhalation of food and vomit: Secondary | ICD-10-CM | POA: Diagnosis not present

## 2019-04-30 DIAGNOSIS — M6281 Muscle weakness (generalized): Secondary | ICD-10-CM | POA: Diagnosis not present

## 2019-04-30 DIAGNOSIS — R1310 Dysphagia, unspecified: Secondary | ICD-10-CM | POA: Diagnosis not present

## 2019-04-30 DIAGNOSIS — F3172 Bipolar disorder, in full remission, most recent episode hypomanic: Secondary | ICD-10-CM | POA: Diagnosis not present

## 2019-04-30 DIAGNOSIS — Z8616 Personal history of COVID-19: Secondary | ICD-10-CM | POA: Diagnosis not present

## 2019-05-01 DIAGNOSIS — F3172 Bipolar disorder, in full remission, most recent episode hypomanic: Secondary | ICD-10-CM | POA: Diagnosis not present

## 2019-05-01 DIAGNOSIS — M6281 Muscle weakness (generalized): Secondary | ICD-10-CM | POA: Diagnosis not present

## 2019-05-01 DIAGNOSIS — R1310 Dysphagia, unspecified: Secondary | ICD-10-CM | POA: Diagnosis not present

## 2019-05-01 DIAGNOSIS — Z8616 Personal history of COVID-19: Secondary | ICD-10-CM | POA: Diagnosis not present

## 2019-05-01 DIAGNOSIS — J690 Pneumonitis due to inhalation of food and vomit: Secondary | ICD-10-CM | POA: Diagnosis not present

## 2019-05-02 DIAGNOSIS — F3172 Bipolar disorder, in full remission, most recent episode hypomanic: Secondary | ICD-10-CM | POA: Diagnosis not present

## 2019-05-02 DIAGNOSIS — Z8616 Personal history of COVID-19: Secondary | ICD-10-CM | POA: Diagnosis not present

## 2019-05-02 DIAGNOSIS — J690 Pneumonitis due to inhalation of food and vomit: Secondary | ICD-10-CM | POA: Diagnosis not present

## 2019-05-02 DIAGNOSIS — M6281 Muscle weakness (generalized): Secondary | ICD-10-CM | POA: Diagnosis not present

## 2019-05-02 DIAGNOSIS — R1310 Dysphagia, unspecified: Secondary | ICD-10-CM | POA: Diagnosis not present

## 2019-05-04 DIAGNOSIS — F3172 Bipolar disorder, in full remission, most recent episode hypomanic: Secondary | ICD-10-CM | POA: Diagnosis not present

## 2019-05-04 DIAGNOSIS — Z8616 Personal history of COVID-19: Secondary | ICD-10-CM | POA: Diagnosis not present

## 2019-05-04 DIAGNOSIS — R1310 Dysphagia, unspecified: Secondary | ICD-10-CM | POA: Diagnosis not present

## 2019-05-04 DIAGNOSIS — J690 Pneumonitis due to inhalation of food and vomit: Secondary | ICD-10-CM | POA: Diagnosis not present

## 2019-05-04 DIAGNOSIS — M6281 Muscle weakness (generalized): Secondary | ICD-10-CM | POA: Diagnosis not present

## 2019-05-05 DIAGNOSIS — F3172 Bipolar disorder, in full remission, most recent episode hypomanic: Secondary | ICD-10-CM | POA: Diagnosis not present

## 2019-05-05 DIAGNOSIS — J690 Pneumonitis due to inhalation of food and vomit: Secondary | ICD-10-CM | POA: Diagnosis not present

## 2019-05-05 DIAGNOSIS — M6281 Muscle weakness (generalized): Secondary | ICD-10-CM | POA: Diagnosis not present

## 2019-05-05 DIAGNOSIS — Z8616 Personal history of COVID-19: Secondary | ICD-10-CM | POA: Diagnosis not present

## 2019-05-05 DIAGNOSIS — R1310 Dysphagia, unspecified: Secondary | ICD-10-CM | POA: Diagnosis not present

## 2019-05-06 DIAGNOSIS — R1310 Dysphagia, unspecified: Secondary | ICD-10-CM | POA: Diagnosis not present

## 2019-05-06 DIAGNOSIS — J690 Pneumonitis due to inhalation of food and vomit: Secondary | ICD-10-CM | POA: Diagnosis not present

## 2019-05-06 DIAGNOSIS — F3172 Bipolar disorder, in full remission, most recent episode hypomanic: Secondary | ICD-10-CM | POA: Diagnosis not present

## 2019-05-06 DIAGNOSIS — Z8616 Personal history of COVID-19: Secondary | ICD-10-CM | POA: Diagnosis not present

## 2019-05-06 DIAGNOSIS — M6281 Muscle weakness (generalized): Secondary | ICD-10-CM | POA: Diagnosis not present

## 2019-05-07 DIAGNOSIS — R1310 Dysphagia, unspecified: Secondary | ICD-10-CM | POA: Diagnosis not present

## 2019-05-07 DIAGNOSIS — J690 Pneumonitis due to inhalation of food and vomit: Secondary | ICD-10-CM | POA: Diagnosis not present

## 2019-05-07 DIAGNOSIS — M6281 Muscle weakness (generalized): Secondary | ICD-10-CM | POA: Diagnosis not present

## 2019-05-07 DIAGNOSIS — Z8616 Personal history of COVID-19: Secondary | ICD-10-CM | POA: Diagnosis not present

## 2019-05-07 DIAGNOSIS — F3172 Bipolar disorder, in full remission, most recent episode hypomanic: Secondary | ICD-10-CM | POA: Diagnosis not present

## 2019-05-08 DIAGNOSIS — F3172 Bipolar disorder, in full remission, most recent episode hypomanic: Secondary | ICD-10-CM | POA: Diagnosis not present

## 2019-05-08 DIAGNOSIS — R222 Localized swelling, mass and lump, trunk: Secondary | ICD-10-CM | POA: Diagnosis not present

## 2019-05-08 DIAGNOSIS — J690 Pneumonitis due to inhalation of food and vomit: Secondary | ICD-10-CM | POA: Diagnosis not present

## 2019-05-08 DIAGNOSIS — S065X0D Traumatic subdural hemorrhage without loss of consciousness, subsequent encounter: Secondary | ICD-10-CM | POA: Diagnosis not present

## 2019-05-08 DIAGNOSIS — I69354 Hemiplegia and hemiparesis following cerebral infarction affecting left non-dominant side: Secondary | ICD-10-CM | POA: Diagnosis not present

## 2019-05-08 DIAGNOSIS — R1310 Dysphagia, unspecified: Secondary | ICD-10-CM | POA: Diagnosis not present

## 2019-05-08 DIAGNOSIS — Z8616 Personal history of COVID-19: Secondary | ICD-10-CM | POA: Diagnosis not present

## 2019-05-08 DIAGNOSIS — Z9181 History of falling: Secondary | ICD-10-CM | POA: Diagnosis not present

## 2019-05-11 DIAGNOSIS — R1310 Dysphagia, unspecified: Secondary | ICD-10-CM | POA: Diagnosis not present

## 2019-05-11 DIAGNOSIS — R222 Localized swelling, mass and lump, trunk: Secondary | ICD-10-CM | POA: Diagnosis not present

## 2019-05-11 DIAGNOSIS — I69354 Hemiplegia and hemiparesis following cerebral infarction affecting left non-dominant side: Secondary | ICD-10-CM | POA: Diagnosis not present

## 2019-05-11 DIAGNOSIS — S065X0D Traumatic subdural hemorrhage without loss of consciousness, subsequent encounter: Secondary | ICD-10-CM | POA: Diagnosis not present

## 2019-05-11 DIAGNOSIS — F3172 Bipolar disorder, in full remission, most recent episode hypomanic: Secondary | ICD-10-CM | POA: Diagnosis not present

## 2019-05-11 DIAGNOSIS — J690 Pneumonitis due to inhalation of food and vomit: Secondary | ICD-10-CM | POA: Diagnosis not present

## 2019-05-12 DIAGNOSIS — U071 COVID-19: Secondary | ICD-10-CM | POA: Diagnosis not present

## 2019-05-12 DIAGNOSIS — Z20828 Contact with and (suspected) exposure to other viral communicable diseases: Secondary | ICD-10-CM | POA: Diagnosis not present

## 2019-05-13 DIAGNOSIS — S065X0D Traumatic subdural hemorrhage without loss of consciousness, subsequent encounter: Secondary | ICD-10-CM | POA: Diagnosis not present

## 2019-05-13 DIAGNOSIS — R222 Localized swelling, mass and lump, trunk: Secondary | ICD-10-CM | POA: Diagnosis not present

## 2019-05-13 DIAGNOSIS — J690 Pneumonitis due to inhalation of food and vomit: Secondary | ICD-10-CM | POA: Diagnosis not present

## 2019-05-13 DIAGNOSIS — R1310 Dysphagia, unspecified: Secondary | ICD-10-CM | POA: Diagnosis not present

## 2019-05-13 DIAGNOSIS — F3172 Bipolar disorder, in full remission, most recent episode hypomanic: Secondary | ICD-10-CM | POA: Diagnosis not present

## 2019-05-13 DIAGNOSIS — I69354 Hemiplegia and hemiparesis following cerebral infarction affecting left non-dominant side: Secondary | ICD-10-CM | POA: Diagnosis not present

## 2019-05-14 DIAGNOSIS — F3172 Bipolar disorder, in full remission, most recent episode hypomanic: Secondary | ICD-10-CM | POA: Diagnosis not present

## 2019-05-14 DIAGNOSIS — S065X0D Traumatic subdural hemorrhage without loss of consciousness, subsequent encounter: Secondary | ICD-10-CM | POA: Diagnosis not present

## 2019-05-14 DIAGNOSIS — I69354 Hemiplegia and hemiparesis following cerebral infarction affecting left non-dominant side: Secondary | ICD-10-CM | POA: Diagnosis not present

## 2019-05-14 DIAGNOSIS — J690 Pneumonitis due to inhalation of food and vomit: Secondary | ICD-10-CM | POA: Diagnosis not present

## 2019-05-14 DIAGNOSIS — R222 Localized swelling, mass and lump, trunk: Secondary | ICD-10-CM | POA: Diagnosis not present

## 2019-05-14 DIAGNOSIS — R1310 Dysphagia, unspecified: Secondary | ICD-10-CM | POA: Diagnosis not present

## 2019-05-15 DIAGNOSIS — F3172 Bipolar disorder, in full remission, most recent episode hypomanic: Secondary | ICD-10-CM | POA: Diagnosis not present

## 2019-05-15 DIAGNOSIS — S065X0D Traumatic subdural hemorrhage without loss of consciousness, subsequent encounter: Secondary | ICD-10-CM | POA: Diagnosis not present

## 2019-05-15 DIAGNOSIS — R222 Localized swelling, mass and lump, trunk: Secondary | ICD-10-CM | POA: Diagnosis not present

## 2019-05-15 DIAGNOSIS — R1310 Dysphagia, unspecified: Secondary | ICD-10-CM | POA: Diagnosis not present

## 2019-05-15 DIAGNOSIS — I69354 Hemiplegia and hemiparesis following cerebral infarction affecting left non-dominant side: Secondary | ICD-10-CM | POA: Diagnosis not present

## 2019-05-15 DIAGNOSIS — J690 Pneumonitis due to inhalation of food and vomit: Secondary | ICD-10-CM | POA: Diagnosis not present

## 2019-05-18 DIAGNOSIS — R1310 Dysphagia, unspecified: Secondary | ICD-10-CM | POA: Diagnosis not present

## 2019-05-18 DIAGNOSIS — S065X0D Traumatic subdural hemorrhage without loss of consciousness, subsequent encounter: Secondary | ICD-10-CM | POA: Diagnosis not present

## 2019-05-18 DIAGNOSIS — F3172 Bipolar disorder, in full remission, most recent episode hypomanic: Secondary | ICD-10-CM | POA: Diagnosis not present

## 2019-05-18 DIAGNOSIS — R222 Localized swelling, mass and lump, trunk: Secondary | ICD-10-CM | POA: Diagnosis not present

## 2019-05-18 DIAGNOSIS — J690 Pneumonitis due to inhalation of food and vomit: Secondary | ICD-10-CM | POA: Diagnosis not present

## 2019-05-18 DIAGNOSIS — I69354 Hemiplegia and hemiparesis following cerebral infarction affecting left non-dominant side: Secondary | ICD-10-CM | POA: Diagnosis not present

## 2019-05-19 DIAGNOSIS — J690 Pneumonitis due to inhalation of food and vomit: Secondary | ICD-10-CM | POA: Diagnosis not present

## 2019-05-19 DIAGNOSIS — R1310 Dysphagia, unspecified: Secondary | ICD-10-CM | POA: Diagnosis not present

## 2019-05-19 DIAGNOSIS — S065X0D Traumatic subdural hemorrhage without loss of consciousness, subsequent encounter: Secondary | ICD-10-CM | POA: Diagnosis not present

## 2019-05-19 DIAGNOSIS — F3172 Bipolar disorder, in full remission, most recent episode hypomanic: Secondary | ICD-10-CM | POA: Diagnosis not present

## 2019-05-19 DIAGNOSIS — R222 Localized swelling, mass and lump, trunk: Secondary | ICD-10-CM | POA: Diagnosis not present

## 2019-05-19 DIAGNOSIS — I69354 Hemiplegia and hemiparesis following cerebral infarction affecting left non-dominant side: Secondary | ICD-10-CM | POA: Diagnosis not present

## 2019-05-21 DIAGNOSIS — S065X0D Traumatic subdural hemorrhage without loss of consciousness, subsequent encounter: Secondary | ICD-10-CM | POA: Diagnosis not present

## 2019-05-21 DIAGNOSIS — F3172 Bipolar disorder, in full remission, most recent episode hypomanic: Secondary | ICD-10-CM | POA: Diagnosis not present

## 2019-05-21 DIAGNOSIS — I69354 Hemiplegia and hemiparesis following cerebral infarction affecting left non-dominant side: Secondary | ICD-10-CM | POA: Diagnosis not present

## 2019-05-21 DIAGNOSIS — R1310 Dysphagia, unspecified: Secondary | ICD-10-CM | POA: Diagnosis not present

## 2019-05-21 DIAGNOSIS — J690 Pneumonitis due to inhalation of food and vomit: Secondary | ICD-10-CM | POA: Diagnosis not present

## 2019-05-21 DIAGNOSIS — R222 Localized swelling, mass and lump, trunk: Secondary | ICD-10-CM | POA: Diagnosis not present

## 2019-05-23 DIAGNOSIS — R222 Localized swelling, mass and lump, trunk: Secondary | ICD-10-CM | POA: Diagnosis not present

## 2019-05-23 DIAGNOSIS — F3172 Bipolar disorder, in full remission, most recent episode hypomanic: Secondary | ICD-10-CM | POA: Diagnosis not present

## 2019-05-23 DIAGNOSIS — R1310 Dysphagia, unspecified: Secondary | ICD-10-CM | POA: Diagnosis not present

## 2019-05-23 DIAGNOSIS — I69354 Hemiplegia and hemiparesis following cerebral infarction affecting left non-dominant side: Secondary | ICD-10-CM | POA: Diagnosis not present

## 2019-05-23 DIAGNOSIS — S065X0D Traumatic subdural hemorrhage without loss of consciousness, subsequent encounter: Secondary | ICD-10-CM | POA: Diagnosis not present

## 2019-05-23 DIAGNOSIS — J690 Pneumonitis due to inhalation of food and vomit: Secondary | ICD-10-CM | POA: Diagnosis not present

## 2019-05-25 ENCOUNTER — Other Ambulatory Visit: Payer: Self-pay

## 2019-05-25 ENCOUNTER — Encounter (HOSPITAL_COMMUNITY): Payer: Self-pay

## 2019-05-25 ENCOUNTER — Emergency Department (HOSPITAL_COMMUNITY): Payer: Medicare Other

## 2019-05-25 ENCOUNTER — Encounter (HOSPITAL_COMMUNITY)
Admission: EM | Disposition: A | Discharge status: Skilled Nursing Facility | Payer: Self-pay | Source: Skilled Nursing Facility | Attending: Neurological Surgery

## 2019-05-25 ENCOUNTER — Emergency Department (HOSPITAL_COMMUNITY): Payer: Medicare Other | Admitting: Critical Care Medicine

## 2019-05-25 ENCOUNTER — Inpatient Hospital Stay (HOSPITAL_COMMUNITY)
Admission: EM | Admit: 2019-05-25 | Discharge: 2019-06-08 | DRG: 025 | Disposition: A | Discharge status: Skilled Nursing Facility | Payer: Medicare Other | Source: Skilled Nursing Facility | Attending: Neurological Surgery | Admitting: Neurological Surgery

## 2019-05-25 ENCOUNTER — Inpatient Hospital Stay (HOSPITAL_COMMUNITY): Payer: Medicare Other

## 2019-05-25 DIAGNOSIS — Z298 Encounter for other specified prophylactic measures: Secondary | ICD-10-CM

## 2019-05-25 DIAGNOSIS — Z79899 Other long term (current) drug therapy: Secondary | ICD-10-CM

## 2019-05-25 DIAGNOSIS — Z8673 Personal history of transient ischemic attack (TIA), and cerebral infarction without residual deficits: Secondary | ICD-10-CM

## 2019-05-25 DIAGNOSIS — Z87891 Personal history of nicotine dependence: Secondary | ICD-10-CM | POA: Diagnosis not present

## 2019-05-25 DIAGNOSIS — I959 Hypotension, unspecified: Secondary | ICD-10-CM | POA: Diagnosis not present

## 2019-05-25 DIAGNOSIS — M2550 Pain in unspecified joint: Secondary | ICD-10-CM | POA: Diagnosis not present

## 2019-05-25 DIAGNOSIS — Z79890 Hormone replacement therapy (postmenopausal): Secondary | ICD-10-CM | POA: Diagnosis not present

## 2019-05-25 DIAGNOSIS — G4489 Other headache syndrome: Secondary | ICD-10-CM | POA: Diagnosis not present

## 2019-05-25 DIAGNOSIS — Z03818 Encounter for observation for suspected exposure to other biological agents ruled out: Secondary | ICD-10-CM | POA: Diagnosis not present

## 2019-05-25 DIAGNOSIS — R0602 Shortness of breath: Secondary | ICD-10-CM | POA: Diagnosis not present

## 2019-05-25 DIAGNOSIS — J9811 Atelectasis: Secondary | ICD-10-CM | POA: Diagnosis not present

## 2019-05-25 DIAGNOSIS — F319 Bipolar disorder, unspecified: Secondary | ICD-10-CM | POA: Diagnosis not present

## 2019-05-25 DIAGNOSIS — R05 Cough: Secondary | ICD-10-CM | POA: Diagnosis not present

## 2019-05-25 DIAGNOSIS — R262 Difficulty in walking, not elsewhere classified: Secondary | ICD-10-CM | POA: Diagnosis not present

## 2019-05-25 DIAGNOSIS — R1310 Dysphagia, unspecified: Secondary | ICD-10-CM | POA: Diagnosis not present

## 2019-05-25 DIAGNOSIS — R1312 Dysphagia, oropharyngeal phase: Secondary | ICD-10-CM | POA: Diagnosis present

## 2019-05-25 DIAGNOSIS — L409 Psoriasis, unspecified: Secondary | ICD-10-CM | POA: Diagnosis present

## 2019-05-25 DIAGNOSIS — S065X9A Traumatic subdural hemorrhage with loss of consciousness of unspecified duration, initial encounter: Secondary | ICD-10-CM | POA: Diagnosis present

## 2019-05-25 DIAGNOSIS — R0902 Hypoxemia: Secondary | ICD-10-CM | POA: Diagnosis not present

## 2019-05-25 DIAGNOSIS — R4781 Slurred speech: Secondary | ICD-10-CM | POA: Diagnosis not present

## 2019-05-25 DIAGNOSIS — I6200 Nontraumatic subdural hemorrhage, unspecified: Secondary | ICD-10-CM | POA: Diagnosis not present

## 2019-05-25 DIAGNOSIS — R5381 Other malaise: Secondary | ICD-10-CM | POA: Diagnosis not present

## 2019-05-25 DIAGNOSIS — Z48811 Encounter for surgical aftercare following surgery on the nervous system: Secondary | ICD-10-CM | POA: Diagnosis not present

## 2019-05-25 DIAGNOSIS — S065X0A Traumatic subdural hemorrhage without loss of consciousness, initial encounter: Secondary | ICD-10-CM | POA: Diagnosis not present

## 2019-05-25 DIAGNOSIS — R58 Hemorrhage, not elsewhere classified: Secondary | ICD-10-CM | POA: Diagnosis not present

## 2019-05-25 DIAGNOSIS — G935 Compression of brain: Secondary | ICD-10-CM | POA: Diagnosis not present

## 2019-05-25 DIAGNOSIS — E7800 Pure hypercholesterolemia, unspecified: Secondary | ICD-10-CM | POA: Diagnosis not present

## 2019-05-25 DIAGNOSIS — G8194 Hemiplegia, unspecified affecting left nondominant side: Secondary | ICD-10-CM | POA: Diagnosis present

## 2019-05-25 DIAGNOSIS — I69391 Dysphagia following cerebral infarction: Secondary | ICD-10-CM | POA: Diagnosis not present

## 2019-05-25 DIAGNOSIS — F1210 Cannabis abuse, uncomplicated: Secondary | ICD-10-CM | POA: Diagnosis not present

## 2019-05-25 DIAGNOSIS — E785 Hyperlipidemia, unspecified: Secondary | ICD-10-CM | POA: Diagnosis not present

## 2019-05-25 DIAGNOSIS — I517 Cardiomegaly: Secondary | ICD-10-CM | POA: Diagnosis not present

## 2019-05-25 DIAGNOSIS — R569 Unspecified convulsions: Secondary | ICD-10-CM | POA: Diagnosis not present

## 2019-05-25 DIAGNOSIS — U071 COVID-19: Secondary | ICD-10-CM | POA: Diagnosis not present

## 2019-05-25 DIAGNOSIS — I639 Cerebral infarction, unspecified: Secondary | ICD-10-CM | POA: Diagnosis not present

## 2019-05-25 DIAGNOSIS — Z880 Allergy status to penicillin: Secondary | ICD-10-CM | POA: Diagnosis not present

## 2019-05-25 DIAGNOSIS — N400 Enlarged prostate without lower urinary tract symptoms: Secondary | ICD-10-CM | POA: Diagnosis present

## 2019-05-25 DIAGNOSIS — Z811 Family history of alcohol abuse and dependence: Secondary | ICD-10-CM

## 2019-05-25 DIAGNOSIS — Z720 Tobacco use: Secondary | ICD-10-CM | POA: Diagnosis not present

## 2019-05-25 DIAGNOSIS — R4701 Aphasia: Secondary | ICD-10-CM | POA: Diagnosis present

## 2019-05-25 DIAGNOSIS — E039 Hypothyroidism, unspecified: Secondary | ICD-10-CM | POA: Diagnosis present

## 2019-05-25 DIAGNOSIS — Z20822 Contact with and (suspected) exposure to covid-19: Secondary | ICD-10-CM | POA: Diagnosis present

## 2019-05-25 DIAGNOSIS — G459 Transient cerebral ischemic attack, unspecified: Secondary | ICD-10-CM | POA: Diagnosis not present

## 2019-05-25 DIAGNOSIS — Z20828 Contact with and (suspected) exposure to other viral communicable diseases: Secondary | ICD-10-CM | POA: Diagnosis not present

## 2019-05-25 DIAGNOSIS — F3130 Bipolar disorder, current episode depressed, mild or moderate severity, unspecified: Secondary | ICD-10-CM | POA: Diagnosis not present

## 2019-05-25 DIAGNOSIS — M6281 Muscle weakness (generalized): Secondary | ICD-10-CM | POA: Diagnosis not present

## 2019-05-25 DIAGNOSIS — J9601 Acute respiratory failure with hypoxia: Secondary | ICD-10-CM | POA: Diagnosis not present

## 2019-05-25 DIAGNOSIS — I69328 Other speech and language deficits following cerebral infarction: Secondary | ICD-10-CM | POA: Diagnosis not present

## 2019-05-25 DIAGNOSIS — S065X9D Traumatic subdural hemorrhage with loss of consciousness of unspecified duration, subsequent encounter: Secondary | ICD-10-CM | POA: Diagnosis not present

## 2019-05-25 DIAGNOSIS — R000 Tachycardia, unspecified: Secondary | ICD-10-CM | POA: Diagnosis not present

## 2019-05-25 DIAGNOSIS — R4182 Altered mental status, unspecified: Secondary | ICD-10-CM | POA: Diagnosis not present

## 2019-05-25 DIAGNOSIS — E440 Moderate protein-calorie malnutrition: Secondary | ICD-10-CM | POA: Insufficient documentation

## 2019-05-25 DIAGNOSIS — Z2989 Encounter for other specified prophylactic measures: Secondary | ICD-10-CM

## 2019-05-25 DIAGNOSIS — S065XAA Traumatic subdural hemorrhage with loss of consciousness status unknown, initial encounter: Secondary | ICD-10-CM | POA: Diagnosis present

## 2019-05-25 DIAGNOSIS — Z7401 Bed confinement status: Secondary | ICD-10-CM | POA: Diagnosis not present

## 2019-05-25 DIAGNOSIS — G9340 Encephalopathy, unspecified: Secondary | ICD-10-CM | POA: Diagnosis not present

## 2019-05-25 DIAGNOSIS — I472 Ventricular tachycardia: Secondary | ICD-10-CM | POA: Diagnosis not present

## 2019-05-25 DIAGNOSIS — E46 Unspecified protein-calorie malnutrition: Secondary | ICD-10-CM | POA: Diagnosis not present

## 2019-05-25 DIAGNOSIS — R059 Cough, unspecified: Secondary | ICD-10-CM

## 2019-05-25 HISTORY — PX: CRANIOTOMY: SHX93

## 2019-05-25 HISTORY — DX: Traumatic subdural hemorrhage with loss of consciousness status unknown, initial encounter: S06.5XAA

## 2019-05-25 HISTORY — DX: Cerebral infarction, unspecified: I63.9

## 2019-05-25 HISTORY — DX: Pneumonitis due to inhalation of food and vomit: J69.0

## 2019-05-25 HISTORY — DX: Traumatic subdural hemorrhage with loss of consciousness of unspecified duration, initial encounter: S06.5X9A

## 2019-05-25 HISTORY — DX: Dysphagia, unspecified: R13.10

## 2019-05-25 LAB — COMPREHENSIVE METABOLIC PANEL
ALT: 26 U/L (ref 0–44)
AST: 34 U/L (ref 15–41)
Albumin: 4.1 g/dL (ref 3.5–5.0)
Alkaline Phosphatase: 90 U/L (ref 38–126)
Anion gap: 12 (ref 5–15)
BUN: 16 mg/dL (ref 8–23)
CO2: 28 mmol/L (ref 22–32)
Calcium: 9.4 mg/dL (ref 8.9–10.3)
Chloride: 102 mmol/L (ref 98–111)
Creatinine, Ser: 1.15 mg/dL (ref 0.61–1.24)
GFR calc Af Amer: >60 mL/min (ref >60)
GFR calc non Af Amer: >60 mL/min (ref >60)
Glucose, Bld: 162 mg/dL — ABNORMAL HIGH (ref 70–99)
Potassium: 4.1 mmol/L (ref 3.5–5.1)
Sodium: 142 mmol/L (ref 135–145)
Total Bilirubin: 0.7 mg/dL (ref 0.3–1.2)
Total Protein: 8.5 g/dL — ABNORMAL HIGH (ref 6.5–8.1)

## 2019-05-25 LAB — CBC WITH DIFFERENTIAL/PLATELET
Abs Immature Granulocytes: 0.03 10*3/uL (ref 0.00–0.07)
Basophils Absolute: 0 10*3/uL (ref 0.0–0.1)
Basophils Relative: 0 %
Eosinophils Absolute: 0.1 10*3/uL (ref 0.0–0.5)
Eosinophils Relative: 1 %
HCT: 44.5 % (ref 39.0–52.0)
Hemoglobin: 14.6 g/dL (ref 13.0–17.0)
Immature Granulocytes: 0 %
Lymphocytes Relative: 13 %
Lymphs Abs: 1.2 10*3/uL (ref 0.7–4.0)
MCH: 32.7 pg (ref 26.0–34.0)
MCHC: 32.8 g/dL (ref 30.0–36.0)
MCV: 99.6 fL (ref 80.0–100.0)
Monocytes Absolute: 0.7 10*3/uL (ref 0.1–1.0)
Monocytes Relative: 7 %
Neutro Abs: 7.4 10*3/uL (ref 1.7–7.7)
Neutrophils Relative %: 79 %
Platelets: 280 10*3/uL (ref 150–400)
RBC: 4.47 MIL/uL (ref 4.22–5.81)
RDW: 13.8 % (ref 11.5–15.5)
WBC: 9.4 10*3/uL (ref 4.0–10.5)
nRBC: 0 % (ref 0.0–0.2)

## 2019-05-25 LAB — TYPE AND SCREEN
ABO/RH(D): A POS
Antibody Screen: NEGATIVE

## 2019-05-25 LAB — VALPROIC ACID LEVEL: Valproic Acid Lvl: 23 ug/mL — ABNORMAL LOW (ref 50.0–100.0)

## 2019-05-25 LAB — RESPIRATORY PANEL BY RT PCR (FLU A&B, COVID)
Influenza A by PCR: NEGATIVE
Influenza B by PCR: NEGATIVE
SARS Coronavirus 2 by RT PCR: NEGATIVE

## 2019-05-25 LAB — PROTIME-INR
INR: 1 (ref 0.8–1.2)
Prothrombin Time: 13.1 seconds (ref 11.4–15.2)

## 2019-05-25 LAB — AMMONIA: Ammonia: 20 umol/L (ref 9–35)

## 2019-05-25 LAB — APTT: aPTT: 30 seconds (ref 24–36)

## 2019-05-25 LAB — MRSA PCR SCREENING: MRSA by PCR: NEGATIVE

## 2019-05-25 LAB — ABO/RH: ABO/RH(D): A POS

## 2019-05-25 SURGERY — CRANIOTOMY HEMATOMA EVACUATION SUBDURAL
Anesthesia: General | Site: Head | Laterality: Left

## 2019-05-25 MED ORDER — EPHEDRINE SULFATE-NACL 50-0.9 MG/10ML-% IV SOSY
PREFILLED_SYRINGE | INTRAVENOUS | Status: DC | PRN
  Administered 2019-05-25 (×4): 10 mg via INTRAVENOUS

## 2019-05-25 MED ORDER — PROPOFOL 10 MG/ML IV BOLUS
INTRAVENOUS | Status: DC | PRN
  Administered 2019-05-25: 100 mg via INTRAVENOUS

## 2019-05-25 MED ORDER — ATORVASTATIN CALCIUM 40 MG PO TABS
40.0000 mg | ORAL_TABLET | Freq: Every day | ORAL | Status: DC
  Administered 2019-05-25 – 2019-06-08 (×12): 40 mg via ORAL
  Filled 2019-05-25 (×13): qty 1

## 2019-05-25 MED ORDER — CHLORHEXIDINE GLUCONATE CLOTH 2 % EX PADS: 6.0000 | MEDICATED_PAD | Freq: Every day | CUTANEOUS | Status: DC

## 2019-05-25 MED ORDER — ACETAMINOPHEN 650 MG RE SUPP: 650.0000 mg | RECTAL | Status: DC | PRN

## 2019-05-25 MED ORDER — DOCUSATE SODIUM 100 MG PO CAPS
100.0000 mg | ORAL_CAPSULE | Freq: Two times a day (BID) | ORAL | Status: DC
  Administered 2019-05-25 – 2019-06-08 (×21): 100 mg via ORAL
  Filled 2019-05-25 (×22): qty 1

## 2019-05-25 MED ORDER — KETOCONAZOLE 2 % EX CREA
1.0000 "application " | TOPICAL_CREAM | Freq: Every day | CUTANEOUS | Status: DC
  Administered 2019-05-26 – 2019-06-08 (×11): 1 via TOPICAL
  Filled 2019-05-25: qty 15

## 2019-05-25 MED ORDER — SUGAMMADEX SODIUM 200 MG/2ML IV SOLN
INTRAVENOUS | Status: DC | PRN
  Administered 2019-05-25 (×2): 100 mg via INTRAVENOUS

## 2019-05-25 MED ORDER — LIDOCAINE 2% (20 MG/ML) 5 ML SYRINGE
INTRAMUSCULAR | Status: AC
  Filled 2019-05-25: qty 5

## 2019-05-25 MED ORDER — LEVOTHYROXINE SODIUM 88 MCG PO TABS
88.0000 ug | ORAL_TABLET | Freq: Every day | ORAL | Status: DC
  Administered 2019-05-26 – 2019-05-28 (×3): 88 ug via ORAL
  Filled 2019-05-25 (×3): qty 1

## 2019-05-25 MED ORDER — FENTANYL CITRATE (PF) 100 MCG/2ML IJ SOLN
INTRAMUSCULAR | Status: DC | PRN
  Administered 2019-05-25: 100 ug via INTRAVENOUS

## 2019-05-25 MED ORDER — 0.9 % SODIUM CHLORIDE (POUR BTL) OPTIME
TOPICAL | Status: DC | PRN
  Administered 2019-05-25 (×3): 1000 mL

## 2019-05-25 MED ORDER — PROPOFOL 10 MG/ML IV BOLUS
INTRAVENOUS | Status: AC
  Filled 2019-05-25: qty 20

## 2019-05-25 MED ORDER — LABETALOL HCL 5 MG/ML IV SOLN
10.0000 mg | INTRAVENOUS | Status: DC | PRN
  Administered 2019-05-25: 17:00:00 20 mg via INTRAVENOUS
  Filled 2019-05-25: qty 4

## 2019-05-25 MED ORDER — CLOTRIMAZOLE 1 % EX CREA
TOPICAL_CREAM | Freq: Two times a day (BID) | CUTANEOUS | Status: DC | PRN
  Filled 2019-05-25: qty 15

## 2019-05-25 MED ORDER — POLYETHYLENE GLYCOL 3350 17 G PO PACK
17.0000 g | PACK | Freq: Every day | ORAL | Status: DC | PRN
  Administered 2019-05-30 – 2019-06-07 (×2): 17 g via ORAL
  Filled 2019-05-25: qty 1

## 2019-05-25 MED ORDER — DRONABINOL 2.5 MG PO CAPS
2.5000 mg | ORAL_CAPSULE | Freq: Two times a day (BID) | ORAL | Status: DC
  Administered 2019-05-26 – 2019-06-08 (×23): 2.5 mg via ORAL
  Filled 2019-05-25 (×26): qty 1

## 2019-05-25 MED ORDER — LIDOCAINE-EPINEPHRINE 1 %-1:100000 IJ SOLN
INTRAMUSCULAR | Status: AC
  Filled 2019-05-25: qty 1

## 2019-05-25 MED ORDER — ROCURONIUM BROMIDE 50 MG/5ML IV SOSY
PREFILLED_SYRINGE | INTRAVENOUS | Status: DC | PRN
  Administered 2019-05-25: 50 mg via INTRAVENOUS

## 2019-05-25 MED ORDER — DIVALPROEX SODIUM 250 MG PO DR TAB
250.0000 mg | DELAYED_RELEASE_TABLET | Freq: Every day | ORAL | Status: DC
  Administered 2019-05-25 – 2019-05-27 (×3): 250 mg via ORAL
  Filled 2019-05-25 (×5): qty 1

## 2019-05-25 MED ORDER — LEVETIRACETAM 500 MG PO TABS
500.0000 mg | ORAL_TABLET | Freq: Two times a day (BID) | ORAL | Status: DC
  Administered 2019-05-25 – 2019-05-28 (×6): 500 mg via ORAL
  Filled 2019-05-25 (×6): qty 1

## 2019-05-25 MED ORDER — LIDOCAINE 2% (20 MG/ML) 5 ML SYRINGE
INTRAMUSCULAR | Status: DC | PRN
  Administered 2019-05-25: 60 mg via INTRAVENOUS

## 2019-05-25 MED ORDER — FAMOTIDINE 20 MG PO TABS
20.0000 mg | ORAL_TABLET | Freq: Every day | ORAL | Status: DC
  Administered 2019-05-26 – 2019-06-08 (×12): 20 mg via ORAL
  Filled 2019-05-25 (×12): qty 1

## 2019-05-25 MED ORDER — VANCOMYCIN HCL IN DEXTROSE 1-5 GM/200ML-% IV SOLN
INTRAVENOUS | Status: AC
  Filled 2019-05-25: qty 200

## 2019-05-25 MED ORDER — PAROXETINE HCL 20 MG PO TABS
20.0000 mg | ORAL_TABLET | Freq: Every day | ORAL | Status: DC
  Administered 2019-05-26 – 2019-06-08 (×12): 20 mg via ORAL
  Filled 2019-05-25 (×14): qty 1

## 2019-05-25 MED ORDER — LIDOCAINE-EPINEPHRINE 1 %-1:100000 IJ SOLN
INTRAMUSCULAR | Status: DC | PRN
  Administered 2019-05-25: 8 mL

## 2019-05-25 MED ORDER — PHENYLEPHRINE HCL-NACL 10-0.9 MG/250ML-% IV SOLN
INTRAVENOUS | Status: DC | PRN
  Administered 2019-05-25: 20 ug/min via INTRAVENOUS

## 2019-05-25 MED ORDER — ONDANSETRON HCL 4 MG/2ML IJ SOLN
INTRAMUSCULAR | Status: DC | PRN
  Administered 2019-05-25: 4 mg via INTRAVENOUS

## 2019-05-25 MED ORDER — HYDROMORPHONE HCL 1 MG/ML IJ SOLN
0.5000 mg | INTRAMUSCULAR | Status: DC | PRN
  Administered 2019-05-27: 18:00:00 0.5 mg via INTRAVENOUS
  Filled 2019-05-25: qty 1

## 2019-05-25 MED ORDER — ONDANSETRON HCL 4 MG/2ML IJ SOLN: 4.0000 mg | INTRAMUSCULAR | Status: DC | PRN

## 2019-05-25 MED ORDER — SUCCINYLCHOLINE CHLORIDE 20 MG/ML IJ SOLN
INTRAMUSCULAR | Status: DC | PRN
  Administered 2019-05-25: 100 mg via INTRAVENOUS

## 2019-05-25 MED ORDER — HYDROCORTISONE 1 % EX CREA
1.0000 "application " | TOPICAL_CREAM | Freq: Every day | CUTANEOUS | Status: DC
  Administered 2019-05-26 – 2019-06-08 (×11): 1 via TOPICAL
  Filled 2019-05-25: qty 28

## 2019-05-25 MED ORDER — PROMETHAZINE HCL 25 MG PO TABS: 12.5000 mg | ORAL_TABLET | ORAL | Status: DC | PRN

## 2019-05-25 MED ORDER — SODIUM CHLORIDE 0.9 % IV SOLN: INTRAVENOUS | Status: DC

## 2019-05-25 MED ORDER — QUETIAPINE FUMARATE 200 MG PO TABS
400.0000 mg | ORAL_TABLET | Freq: Every day | ORAL | Status: DC
  Administered 2019-05-25 – 2019-06-07 (×13): 400 mg via ORAL
  Filled 2019-05-25 (×15): qty 2

## 2019-05-25 MED ORDER — ROCURONIUM BROMIDE 10 MG/ML (PF) SYRINGE
PREFILLED_SYRINGE | INTRAVENOUS | Status: AC
  Filled 2019-05-25: qty 10

## 2019-05-25 MED ORDER — SODIUM CHLORIDE 0.9 % IV SOLN
INTRAVENOUS | Status: DC | PRN
  Administered 2019-05-25: 500 mL

## 2019-05-25 MED ORDER — HEMOSTATIC AGENTS (NO CHARGE) OPTIME
TOPICAL | Status: DC | PRN
  Administered 2019-05-25: 1

## 2019-05-25 MED ORDER — BACITRACIN ZINC 500 UNIT/GM EX OINT
TOPICAL_OINTMENT | CUTANEOUS | Status: AC
  Filled 2019-05-25: qty 28.35

## 2019-05-25 MED ORDER — ALBUMIN HUMAN 5 % IV SOLN: INTRAVENOUS | Status: DC | PRN

## 2019-05-25 MED ORDER — FENTANYL CITRATE (PF) 250 MCG/5ML IJ SOLN
INTRAMUSCULAR | Status: AC
  Filled 2019-05-25: qty 5

## 2019-05-25 MED ORDER — THROMBIN 5000 UNITS EX SOLR
OROMUCOSAL | Status: DC | PRN
  Administered 2019-05-25: 5 mL

## 2019-05-25 MED ORDER — SUCCINYLCHOLINE CHLORIDE 200 MG/10ML IV SOSY
PREFILLED_SYRINGE | INTRAVENOUS | Status: AC
  Filled 2019-05-25: qty 10

## 2019-05-25 MED ORDER — HEPARIN SODIUM (PORCINE) 5000 UNIT/ML IJ SOLN
5000.0000 [IU] | Freq: Three times a day (TID) | INTRAMUSCULAR | Status: DC
  Administered 2019-05-28 – 2019-06-08 (×34): 5000 [IU] via SUBCUTANEOUS
  Filled 2019-05-25 (×34): qty 1

## 2019-05-25 MED ORDER — HYDROCODONE-ACETAMINOPHEN 5-325 MG PO TABS: 1.0000 | ORAL_TABLET | ORAL | Status: DC | PRN

## 2019-05-25 MED ORDER — ACETAMINOPHEN 325 MG PO TABS
650.0000 mg | ORAL_TABLET | ORAL | Status: DC | PRN
  Administered 2019-05-30 – 2019-06-07 (×2): 650 mg via ORAL
  Filled 2019-05-25: qty 2

## 2019-05-25 MED ORDER — ONDANSETRON HCL 4 MG PO TABS: 4.0000 mg | ORAL_TABLET | ORAL | Status: DC | PRN

## 2019-05-25 MED ORDER — PHENYLEPHRINE 40 MCG/ML (10ML) SYRINGE FOR IV PUSH (FOR BLOOD PRESSURE SUPPORT)
PREFILLED_SYRINGE | INTRAVENOUS | Status: DC | PRN
  Administered 2019-05-25: 120 ug via INTRAVENOUS

## 2019-05-25 MED ORDER — SENNOSIDES-DOCUSATE SODIUM 8.6-50 MG PO TABS: 1.0000 | ORAL_TABLET | Freq: Every day | ORAL | Status: DC | PRN

## 2019-05-25 MED ORDER — PHENYLEPHRINE 40 MCG/ML (10ML) SYRINGE FOR IV PUSH (FOR BLOOD PRESSURE SUPPORT)
PREFILLED_SYRINGE | INTRAVENOUS | Status: AC
  Filled 2019-05-25: qty 10

## 2019-05-25 MED ORDER — THROMBIN 5000 UNITS EX SOLR
CUTANEOUS | Status: AC
  Filled 2019-05-25: qty 5000

## 2019-05-25 MED ORDER — VANCOMYCIN HCL IN DEXTROSE 1-5 GM/200ML-% IV SOLN
1000.0000 mg | Freq: Once | INTRAVENOUS | Status: AC
  Administered 2019-05-25: 14:00:00 1000 mg via INTRAVENOUS

## 2019-05-25 MED ORDER — DEXAMETHASONE SODIUM PHOSPHATE 10 MG/ML IJ SOLN
INTRAMUSCULAR | Status: DC | PRN
  Administered 2019-05-25: 10 mg via INTRAVENOUS

## 2019-05-25 SURGICAL SUPPLY — 68 items
BLADE CLIPPER SURG (BLADE) ×3 IMPLANT
BNDG GAUZE ELAST 4 BULKY (GAUZE/BANDAGES/DRESSINGS) IMPLANT
BUR ACORN 9.0 PRECISION (BURR) ×2 IMPLANT
BUR ACORN 9.0MM PRECISION (BURR) ×1
BUR MATCHSTICK NEURO 3.0 LAGG (BURR) IMPLANT
BUR SPIRAL ROUTER 2.3 (BUR) ×2 IMPLANT
BUR SPIRAL ROUTER 2.3MM (BUR) ×1
CANISTER SUCT 3000ML PPV (MISCELLANEOUS) ×3 IMPLANT
CATH VENTRIC 35X38 W/TROCAR LG (CATHETERS) ×2 IMPLANT
CLIP VESOCCLUDE MED 6/CT (CLIP) IMPLANT
COVER BURR HOLE 14 (Orthopedic Implant) ×2 IMPLANT
COVER WAND RF STERILE (DRAPES) ×3 IMPLANT
DRAPE NEUROLOGICAL W/INCISE (DRAPES) ×3 IMPLANT
DRAPE SHEET LG 3/4 BI-LAMINATE (DRAPES) ×3 IMPLANT
DRAPE SURG 17X23 STRL (DRAPES) IMPLANT
DRAPE WARM FLUID 44X44 (DRAPES) ×3 IMPLANT
DURAPREP 6ML APPLICATOR 50/CS (WOUND CARE) ×3 IMPLANT
ELECT REM PT RETURN 9FT ADLT (ELECTROSURGICAL) ×3
ELECTRODE REM PT RTRN 9FT ADLT (ELECTROSURGICAL) ×1 IMPLANT
EVACUATOR 1/8 PVC DRAIN (DRAIN) IMPLANT
EVACUATOR SILICONE 100CC (DRAIN) IMPLANT
GAUZE 4X4 16PLY RFD (DISPOSABLE) IMPLANT
GAUZE SPONGE 4X4 12PLY STRL (GAUZE/BANDAGES/DRESSINGS) ×3 IMPLANT
GLOVE BIO SURGEON STRL SZ7.5 (GLOVE) ×6 IMPLANT
GLOVE BIOGEL PI IND STRL 7.5 (GLOVE) ×2 IMPLANT
GLOVE BIOGEL PI INDICATOR 7.5 (GLOVE) ×4
GLOVE EXAM NITRILE LRG STRL (GLOVE) IMPLANT
GLOVE EXAM NITRILE XL STR (GLOVE) IMPLANT
GLOVE EXAM NITRILE XS STR PU (GLOVE) IMPLANT
GOWN STRL REUS W/ TWL LRG LVL3 (GOWN DISPOSABLE) ×2 IMPLANT
GOWN STRL REUS W/ TWL XL LVL3 (GOWN DISPOSABLE) IMPLANT
GOWN STRL REUS W/TWL 2XL LVL3 (GOWN DISPOSABLE) IMPLANT
GOWN STRL REUS W/TWL LRG LVL3 (GOWN DISPOSABLE) ×6
GOWN STRL REUS W/TWL XL LVL3 (GOWN DISPOSABLE)
HEMOSTAT POWDER KIT SURGIFOAM (HEMOSTASIS) ×3 IMPLANT
HEMOSTAT POWDER SURGIFOAM 1G (HEMOSTASIS) ×2 IMPLANT
HEMOSTAT SURGICEL 2X14 (HEMOSTASIS) ×2 IMPLANT
HOOK DURA 1/2IN (MISCELLANEOUS) ×3 IMPLANT
KIT BASIN OR (CUSTOM PROCEDURE TRAY) ×3 IMPLANT
KIT TURNOVER KIT B (KITS) ×3 IMPLANT
NEEDLE HYPO 22GX1.5 SAFETY (NEEDLE) ×3 IMPLANT
NS IRRIG 1000ML POUR BTL (IV SOLUTION) ×3 IMPLANT
PACK CRANIOTOMY CUSTOM (CUSTOM PROCEDURE TRAY) ×3 IMPLANT
PATTIES SURGICAL .5 X.5 (GAUZE/BANDAGES/DRESSINGS) IMPLANT
PATTIES SURGICAL .5 X3 (DISPOSABLE) IMPLANT
PATTIES SURGICAL 1X1 (DISPOSABLE) IMPLANT
PLATE CRANIAL SHUNT 14 (Plate) ×2 IMPLANT
SCREW UNIII AXS SD 1.5X4 (Screw) ×26 IMPLANT
SPONGE NEURO XRAY DETECT 1X3 (DISPOSABLE) IMPLANT
STAPLER VISISTAT 35W (STAPLE) ×3 IMPLANT
STOCKINETTE 6  STRL (DRAPES) ×3
STOCKINETTE 6 STRL (DRAPES) ×1 IMPLANT
SUT ETHILON 3 0 FSL (SUTURE) IMPLANT
SUT ETHILON 3 0 PS 1 (SUTURE) IMPLANT
SUT NURALON 4 0 TR CR/8 (SUTURE) ×9 IMPLANT
SUT STEEL 0 (SUTURE)
SUT STEEL 0 18XMFL TIE 17 (SUTURE) IMPLANT
SUT VIC AB 0 CT1 18XCR BRD8 (SUTURE) ×2 IMPLANT
SUT VIC AB 0 CT1 8-18 (SUTURE) ×6
SUT VIC AB 3-0 SH 8-18 (SUTURE) ×6 IMPLANT
SYSTEM CSF EXTERNAL DRAINAGE (MISCELLANEOUS) ×2 IMPLANT
TOWEL GREEN STERILE (TOWEL DISPOSABLE) ×3 IMPLANT
TOWEL GREEN STERILE FF (TOWEL DISPOSABLE) ×3 IMPLANT
TRAY FOLEY MTR SLVR 16FR STAT (SET/KITS/TRAYS/PACK) ×3 IMPLANT
TUBE CONNECTING 12'X1/4 (SUCTIONS) ×1
TUBE CONNECTING 12X1/4 (SUCTIONS) ×2 IMPLANT
UNDERPAD 30X30 (UNDERPADS AND DIAPERS) ×3 IMPLANT
WATER STERILE IRR 1000ML POUR (IV SOLUTION) ×3 IMPLANT

## 2019-05-25 NOTE — Progress Notes (Signed)
Spoke to [facility] facility. They report that patient is a full code. Attempted to contact family at number listed without success. Facility verified we had correct contact information.

## 2019-05-25 NOTE — H&P (Signed)
Neurosurgery H&P  CC: Altered mental status  HPI: This is a 69 y.o. that I previously saw 04/22/19 for left sided weakness. He had a left sided SDH at that time which was clearly not causing his symptoms, further workup revealed some small right frontal infarcts. Given his SDH, from review of his medical records, it appears he was discharged back to his long term care facility on ASA81 and keppra. Today he presented to [facility] ED with 24h of weakness, worsening mental status, and motor speech difficulty. Repeat CTH was obtained which showed a significant expansion of his SDH, so he was transferred to [facility] for neurosurgical care. Further history is unfortunately limited due to the patient's altered mental status and speech deficit. No known recent use of anti-platelet or anti-coagulant medications except for ASA81, but again limited by patient's aphasia. My last neurologic examination was notable for baseline b/l XT but fulls strength with fluent speech.   ROS: A 14 point ROS was performed and is negative except as noted in the HPI.   PMHx:  Past Medical History:  Diagnosis Date  . Aspiration pneumonia ([place])   . Bipolar 1 disorder ([place])   . Constipation   . Dysphagia   . Hypercholesterolemia   . Psoriasis   . Stroke [facility])    ischemic stroke  . Subdural hematoma ([place])   . Thyroid disease    hypothyroid   FamHx:  Family History  Problem Relation Age of Onset  . Alcohol abuse Brother   . Colon cancer Neg Hx    SocHx:  reports that he has quit smoking. His smoking use included cigarettes. He has never used smokeless tobacco. He reports that he does not drink alcohol or use drugs.  Exam: Vital signs in last 24 hours: Temp:  [98.1 F (36.7 C)] 98.1 F (36.7 C) (04/12 0826) Pulse Rate:  [78-90] 84 (04/12 1030) Resp:  [11-19] 11 (04/12 1030) BP: (117-129)/(50-92) 117/92 (04/12 1030) SpO2:  [91 %-95 %] 93 % (04/12 1030) Weight:  [77.6 kg] 77.6 kg (04/12 0822) General: Awake, alert,  cooperative, lying in bed in NAD Head: normocephalic and atruamatic, L superofrontal horshoe shaped scar  HEENT: neck supple Pulmonary: breathing room air comfortably, no evidence of increased work of breathing Cardiac: RRR Abdomen: S NT ND Extremities: warm and well perfused x4 Neuro: Awake/alert, able to answer some simple questions but no complex questions, follows simple commands but not complex Oriented to self, not place / time Bilateral ptosis, pupils difficult to evaluate due to poor cooperation but appear symmetric, EOM unable to evaluate - won't FC with eyes, appears to have b/l XT Strength 4/5 on L, 4-/5 on R with external rotation of right hip Won't FC well enough for drift testing  Assessment and Plan: 69 y.o. man with progressive obtundation with broca's aphasia. [place] personally reviewed, which shows 3.5cm mixed density left hemispheric SDH with 2cm of midline shift with brain compression.   Neuro: -OR for emergent evacuation, discussed with patient's sister who provided phone consent. Her phone #s are [phone] or [phone]. -4N post-op -cont home depakote / keppra / quetiapine / paroxetine  Cardiopulm: -no active issues  FENGI: -ADAT post-op  Heme/ID: -no active issues  Endo/Ppx/Dispo: -SCDs, TEDs, hold SQH until [place], MD 05/25/19 11:03 AM [facility]

## 2019-05-25 NOTE — ED Triage Notes (Addendum)
Pt sent by [facility] due to increase weakness since yesterday.Possibly slurred speech. Not his usual self Last known well unknown. Pt has hx of subdural hematoma and ischemic stroke

## 2019-05-25 NOTE — Anesthesia Procedure Notes (Signed)
Arterial Line Insertion Start/End4/01/2020 1:20 PM, 05/25/2019 1:25 PM Performed by: Wilburn Cornelia, CRNA, CRNA  Patient location: Pre-op. Preanesthetic checklist: patient identified, IV checked, site marked, risks and benefits discussed, surgical consent, monitors and equipment checked, pre-op evaluation, timeout performed and anesthesia consent Lidocaine 1% used for infiltration and patient sedated Left, radial was placed Catheter size: 20 G Hand hygiene performed  and maximum sterile barriers used  Allen's test indicative of satisfactory collateral circulation Attempts: 1 Procedure performed without using ultrasound guided technique. Following insertion, Biopatch. Post procedure assessment: normal  Patient tolerated the procedure well with no immediate complications.

## 2019-05-25 NOTE — Brief Op Note (Signed)
05/25/2019  3:11 PM  PATIENT:  Ricky Ward  69 y.o. male  PRE-OPERATIVE DIAGNOSIS:  Subdural hematoma  POST-OPERATIVE DIAGNOSIS:  Subdural hematoma  PROCEDURE:  Procedure(s) with comments: CRANIOTOMY HEMATOMA EVACUATION SUBDURAL (Left) - CRANIOTOMY HEMATOMA EVACUATION SUBDURAL  SURGEON:  Surgeon(s) and Role:    * Teresa Lemmerman, Joyice Faster, MD - Primary  PHYSICIAN ASSISTANT:   ASSISTANTS: none   ANESTHESIA:   local  EBL:  100 mL   BLOOD ADMINISTERED:none  DRAINS: Ventricular catheter in the subdural space   LOCAL MEDICATIONS USED:  LIDOCAINE   SPECIMEN:  No Specimen  DISPOSITION OF SPECIMEN:  N/A  COUNTS:  YES  TOURNIQUET:  * No tourniquets in log *  DICTATION: .Note written in EPIC  PLAN OF CARE: Admit to inpatient   PATIENT DISPOSITION:  PACU - hemodynamically stable.   Delay start of Pharmacological VTE agent (>24hrs) due to surgical blood loss or risk of bleeding: yes

## 2019-05-25 NOTE — Anesthesia Procedure Notes (Addendum)
Procedure Name: Intubation Date/Time: 05/25/2019 1:51 PM Performed by: Wilburn Cornelia, CRNA Pre-anesthesia Checklist: Patient identified, Emergency Drugs available, Suction available, Patient being monitored and Timeout performed Patient Re-evaluated:Patient Re-evaluated prior to induction Oxygen Delivery Method: Circle system utilized Preoxygenation: Pre-oxygenation with 100% oxygen Induction Type: IV induction, Rapid sequence and Cricoid Pressure applied Laryngoscope Size: Mac and 4 Grade View: Grade I Tube type: Oral Tube size: 7.5 mm Number of attempts: 1 Airway Equipment and Method: Stylet Placement Confirmation: ETT inserted through vocal cords under direct vision,  positive ETCO2 and breath sounds checked- equal and bilateral Secured at: 22 cm Tube secured with: Tape Dental Injury: Teeth and Oropharynx as per pre-operative assessment

## 2019-05-25 NOTE — Anesthesia Preprocedure Evaluation (Signed)
Anesthesia Evaluation  Patient identified by MRN, date of birth, ID band Patient confused    Reviewed: Allergy & Precautions, NPO status , Patient's Chart, lab work & pertinent test results  History of Anesthesia Complications Negative for: history of anesthetic complications  Airway Mallampati: III  TM Distance: >3 FB Neck ROM: Full    Dental  (+) Dental Advisory Given, Edentulous Upper, Edentulous Lower   Pulmonary neg recent URI, former smoker,    breath sounds clear to auscultation       Cardiovascular negative cardio ROS   Rhythm:Regular     Neuro/Psych PSYCHIATRIC DISORDERS Bipolar Disorder Left exotropia, previous acute right infarct, left subdural hematoma with midline shift  CVA, Residual Symptoms    GI/Hepatic Neg liver ROS, GERD  Medicated and Controlled,  Endo/Other  Hypothyroidism   Renal/GU negative Renal ROS     Musculoskeletal negative musculoskeletal ROS (+)   Abdominal   Peds  Hematology negative hematology ROS (+)   Anesthesia Other Findings   Reproductive/Obstetrics                             Anesthesia Physical Anesthesia Plan  ASA: III and emergent  Anesthesia Plan: General   Post-op Pain Management:    Induction: Intravenous and Rapid sequence  PONV Risk Score and Plan: 2 and Treatment may vary due to age or medical condition, Ondansetron and Dexamethasone  Airway Management Planned: Oral ETT  Additional Equipment: Arterial line  Intra-op Plan:   Post-operative Plan: Extubation in OR  Informed Consent:     Only emergency history available and History available from chart only  Plan Discussed with: CRNA and Surgeon  Anesthesia Plan Comments:         Anesthesia Quick Evaluation

## 2019-05-25 NOTE — Progress Notes (Signed)
Patient non- verbal intermittently following commands. NSR on monitor. SpO2 99% on RA. Sister contacted and completed consent with sister.

## 2019-05-25 NOTE — ED Provider Notes (Addendum)
[facility] EMERGENCY DEPARTMENT Provider Note   CSN: [id number] Arrival date & time: 05/25/19  [id number]     History Chief Complaint  Patient presents with  . Weakness    Ricky Ward is a 69 y.o. male.  HPI Level 5 caveat due to altered mental status. Patient brought in from high [place] home.  Reportedly generalized weakness since yesterday.  Confusion.  Patient really cannot provide much history.  Will follow commands but cannot tell me where he is.  Cannot tell me really any history.  Will move bilateral upper extremities command.  More difficulty moving lower extremities.  Afebrile.  Recent both ischemic stroke and subdural hematoma.    Past Medical History:  Diagnosis Date  . Aspiration pneumonia ([place])   . Bipolar 1 disorder ([place])   . Constipation   . Dysphagia   . Hypercholesterolemia   . Psoriasis   . Stroke [facility])    ischemic stroke  . Subdural hematoma ([place])   . Thyroid disease    hypothyroid    Patient Active Problem List   Diagnosis Date Noted  . Acute ischemic stroke ([place]) 04/24/2019  . Subdural hemorrhage ([place]) 04/22/2019  . BPH (benign prostatic hyperplasia) 04/07/2019  . Urinary retention due to benign prostatic hyperplasia 04/07/2019  . Bipolar 1 disorder ([place])   . Aspiration pneumonia ([place]) 04/04/2019  . Acute respiratory failure with hypoxia ([place]) 04/04/2019  . Dysphagia 04/04/2019  . Hypothyroidism 04/04/2019  . HLD (hyperlipidemia) 04/04/2019  . Heartburn 03/12/2019  . Preventative health care 04/28/2018  . Alcohol use disorder, mild, in sustained remission 10/09/2017    Past Surgical History:  Procedure Laterality Date  . FOOT SURGERY  08/23/2003  . WRIST FRACTURE SURGERY Left        Family History  Problem Relation Age of Onset  . Alcohol abuse Brother   . Colon cancer Neg Hx     Social History   Tobacco Use  . Smoking status: Former Smoker    Types: Cigarettes  . Smokeless tobacco: Never Used  Substance Use Topics  .  Alcohol use: No  . Drug use: No    Home Medications Prior to Admission medications   Medication Sig Start Date End Date Taking? Authorizing Provider  atorvastatin (LIPITOR) 40 MG tablet Take 1 tablet (40 mg total) by mouth daily at 6 PM. 04/24/19   Danford, Suann Larry, MD  Calcipotriene-Betameth Diprop (ENSTILAR) 0.005-0.064 % FOAM Apply topically. Use as directed    [provider]  clotrimazole-betamethasone (LOTRISONE) cream Apply 1 application topically 2 (two) times daily as needed (rash).    [provider]  divalproex (DEPAKOTE) 250 MG DR tablet Take 1 tablet (250 mg total) by mouth at bedtime. 02/27/19   Orlene Erm, MD  dronabinol (MARINOL) 2.5 MG capsule Take 2.5 mg by mouth 2 (two) times daily before a meal.    [provider]  famotidine (PEPCID) 20 MG tablet Take 1 tablet (20 mg total) by mouth as needed for heartburn or indigestion (May use once to twice daily). Patient taking differently: Take 20 mg by mouth 2 (two) times daily as needed for heartburn or indigestion (May use once to twice daily).  03/12/19   Erenest Rasher, PA-C  hydrocortisone 2.5 % cream Apply 1 application topically daily. *Apply to face, scalp, neck, and behind the ears    [provider]  ketoconazole (NIZORAL) 2 % cream Apply 1 application topically daily. Applied to the face, scalp, neck, and behind the  ears    [provider]  ketoconazole (NIZORAL) 2 % shampoo Apply 1 application topically daily.    [provider]  levETIRAcetam (KEPPRA) 500 MG tablet Take 1 tablet (500 mg total) by mouth 2 (two) times daily. 04/24/19   Danford, Suann Larry, MD  levothyroxine (SYNTHROID) 88 MCG tablet Take 1 tablet (88 mcg total) by mouth daily. 04/24/19 04/23/20  Danford, Suann Larry, MD  Magnesium Oxide (MAG-OXIDE) 200 MG TABS Take 2 tablets (400 mg total) by mouth in the morning and at bedtime. 04/24/19   Danford, Suann Larry, MD  PARoxetine (PAXIL) 20 MG  tablet Take 1 tablet (20 mg total) by mouth daily. 04/24/19 04/23/20  Edwin Dada, MD  QUEtiapine (SEROQUEL) 400 MG tablet Take 1 tablet (400 mg total) by mouth at bedtime. 04/24/19   Danford, Suann Larry, MD  QUEtiapine (SEROQUEL) 50 MG tablet Take 1 tablet (50 mg total) by mouth at bedtime. 04/24/19   Danford, Suann Larry, MD  sennosides-docusate sodium (SENOKOT-S) 8.6-50 MG tablet Take 1 tablet by mouth daily as needed for constipation.    [provider]    Allergies    Penicillins  Review of Systems   Review of Systems  Unable to perform ROS: Mental status change    Physical Exam Updated Vital Signs BP (!) 117/50   Pulse 78   Temp 98.1 F (36.7 C)   Resp 14   Ht 5\' 7"  (1.702 m)   Wt 77.6 kg   SpO2 91%   BMI 26.80 kg/m   Physical Exam Vitals and nursing note reviewed.  Constitutional:      Comments: Sitting in bed with eyes open.  Mostly staring forward however.  HENT:     Mouth/Throat:     Mouth: Mucous membranes are moist.  Eyes:     Comments: Disconjugate gaze at baseline.  Cardiovascular:     Rate and Rhythm: Normal rate.  Pulmonary:     Breath sounds: No wheezing or rhonchi.  Abdominal:     Tenderness: There is no abdominal tenderness.  Musculoskeletal:        General: No tenderness.     Cervical back: Neck supple.  Skin:    General: Skin is warm.     Capillary Refill: Capillary refill takes less than 2 seconds.  Neurological:     Comments: Awake and will answer some questions.  Moves both upper extremities to command.  Smiles to command.  May have mild left-sided facial droop.  Good grip strength bilaterally.  Would not wiggle feet to command however.  Not able to tell me is in the hospital.     ED Results / Procedures / Treatments   Labs (all labs ordered are listed, but only abnormal results are displayed) Labs Reviewed  COMPREHENSIVE METABOLIC PANEL - Abnormal; Notable for the following components:      Result Value    Glucose, Bld 162 (*)    Total Protein 8.5 (*)    All other components within normal limits  VALPROIC ACID LEVEL - Abnormal; Notable for the following components:   Valproic Acid Lvl 23 (*)    All other components within normal limits  RESPIRATORY PANEL BY RT PCR (FLU A&B, COVID)  CBC WITH DIFFERENTIAL/PLATELET  AMMONIA  URINALYSIS, ROUTINE W REFLEX MICROSCOPIC  PROTIME-INR  APTT    EKG EKG Interpretation  Date/Time:  Monday May 25 2019 08:22:39 EDT Ventricular Rate:  94 PR Interval:    QRS Duration: 249 QT Interval:  411 QTC  Calculation: 517 R Axis:   -24 Text Interpretation: Sinus rhythm Left atrial enlargement Nonspecific intraventricular conduction delay baseline artifact Confirmed by Davonna Belling [id number]) on 05/25/2019 8:46:07 AM   Radiology CT Head Wo Contrast  Result Date: 05/25/2019 CLINICAL DATA:  Altered mental status and slurred speech EXAM: CT HEAD WITHOUT CONTRAST TECHNIQUE: Contiguous axial images were obtained from the base of the skull through the vertex without intravenous contrast. COMPARISON:  April 24, 2019 FINDINGS: Brain: The previously noted subdural hematoma on the left has become much larger with both acute and subacute components. There has been recent rebleeding in this area. The subdural hematoma extends from the anterior left frontal region with involvement of the superior left temporal lobe, left parietal lobe, extending to the left convexity. The subdural hematoma has a maximum thickness of 3.5 cm at the level of the convexity on the left. There is marked mass effect on the left side of the brain. There is approximately 2 cm of midline shift to the right with effacement of the left lateral ventricle and the anterior aspect of the right lateral ventricle. There is effacement of the third ventricle as well. The fourth ventricle remains in the midline. There is no intra-axial mass or hemorrhage. There is ill-defined small vessel disease in the centra  semiovale bilaterally with concern for developing acute infarct in the left lentiform nucleus with involvement of a portion of the left internal capsule anteriorly. Vascular: No hyperdense vessel. There is calcification in each carotid siphon region. Skull: Bony calvarium appears intact. Sinuses/Orbits: Mucosal thickening opacification noted in multiple ethmoid air cells. Orbits appear symmetric bilaterally. Other: Mastoid air cells are clear. Debris noted in each external auditory canal. IMPRESSION: 1. Large left-sided subdural hematoma which has become considerably larger compared to 1 month prior. There is evidence of acute and subacute components of hemorrhage within this large subdural hematoma. This subdural hematoma has its maximum thickness in the left convexity region of 3.5 cm. There is mass effect on the left supratentorial region of the brain with 2.0 cm of mass-effect toward the right. There is lateral and third ventricular effacement. 2. Suspect developing infarct involving a portion of the anterior left lentiform nucleus in the anterior limb of the left internal capsule. 3.  No intra-axial hemorrhage. 4.  Foci of arterial vascular calcification noted. 5.  Ethmoid paranasal sinus disease. 6.  Probable cerumen in the external auditory canals. Critical Value/emergent results were called by telephone at the time of interpretation on 05/25/2019 at 9:00 am to provider [facility] , who verbally acknowledged these results. Electronically Signed   By: Lowella Grip III M.D.   On: 05/25/2019 09:00   DG Chest Portable 1 View  Result Date: 05/25/2019 CLINICAL DATA:  Weakness and possible slurred speech EXAM: PORTABLE CHEST 1 VIEW COMPARISON:  04/04/2019 chest radiograph. FINDINGS: Right rotated chest radiograph. Stable cardiomediastinal silhouette with normal heart size and moderate hiatal hernia. No pneumothorax. Mild blunting of the right costophrenic angle. No left pleural effusion. No pulmonary  edema. Mild right basilar atelectasis. IMPRESSION: 1. Limited right rotated chest radiograph. Mild blunting of the right costophrenic angle, cannot exclude small right pleural effusion. 2. Mild right basilar atelectasis. 3. Moderate hiatal hernia. Electronically Signed   By: Ilona Sorrel M.D.   On: 05/25/2019 08:45    Procedures Procedures (including critical care time)  Medications Ordered in ED Medications - No data to display  ED Course  I have reviewed the triage vital signs and the nursing notes.  Pertinent labs & imaging results that were available during my care of the patient were reviewed by me and considered in my medical decision making (see chart for details).    MDM Rules/Calculators/A&P                      Patient with mental status change.  Recent subdural and recent ischemic stroke.  However has had increasing mental status.  Found to have large acute on chronic subdural on head CT.  There is a large amount of shift.  Discussed with Dr. Venetia Constable from neurosurgery.  Will transfer to preop for emergent surgery.  Have gotten rapid Covid test.  No 3% saline at this time.  CRITICAL CARE Performed by: Davonna Belling Total critical care time: 30 minutes Critical care time was exclusive of separately billable procedures and treating other patients. Critical care was necessary to treat or prevent imminent or life-threatening deterioration. Critical care was time spent personally by me on the following activities: development of treatment plan with patient and/or surrogate as well as nursing, discussions with consultants, evaluation of patient's response to treatment, examination of patient, obtaining history from patient or surrogate, ordering and performing treatments and interventions, ordering and review of laboratory studies, ordering and review of radiographic studies, pulse oximetry and re-evaluation of patient's condition.  10:29 AM still awaiting transportation and  availability.  Was told were waiting on preop to call and say that they are ready and that they will send a truck, however we will call to make sure that this is progressing well.  Patient still awake without change in mental status from earlier today.   Final Clinical Impression(s) / ED Diagnoses Final diagnoses:  Subdural hematoma [facility])    Rx / DC Orders ED Discharge Orders    None       Davonna Belling, MD 05/25/19 [id number]    Davonna Belling, MD 05/25/19 [id number]

## 2019-05-25 NOTE — Op Note (Signed)
PATIENT: Ricky Ward  DAY OF SURGERY: 05/25/19   PRE-OPERATIVE DIAGNOSIS:  Left subdural hematoma   POST-OPERATIVE DIAGNOSIS:  Left subdural hematoma   PROCEDURE:  Left craniotomy for evacuation of subdural hematoma   SURGEON:  Surgeon(s) and Role:    Judith Part, MD - Primary   ANESTHESIA: ETGA   BRIEF HISTORY: This is a 69 year old man with a recent admission for small L SDH and R F small strokes who now re-presents with progressive obtundation. The patient was found to have a large expansion of his L SDH, measuring over 3cm in some areas with 2cm of midline shift. This was discussed with the patient's sister as well as risks, benefits, and alternatives, who agreed that the patient would want surgery in this situation.   OPERATIVE DETAIL: The patient was taken to the operating room and placed on the OR table in the supine position. A formal time out was performed with two patient identifiers and confirmed the operative site. Anesthesia was induced by the anesthesia team. The head was turned to the right to expose the left side of the head. The incision area had been previously marked in preop, hair was clipped with surgical clippers, the area was then prepped and draped in a sterile fashion.   A linear incision was placed in the left frontal convexity to line up with the frontal limb of an inverted question mark trauma flap. Soft tissues were dissected, retractors placed, and a craniotomy flap was turned with a high speed drill. The dura was extremely adherent and difficult to separate from the periosteum. The bone flap was removed, dura was opened, and mixed density hematoma was encountered under pressure. This was evacuated, hemostasis was obtained, and was extensively irrigated. A subdural drain was placed and tunneled through a burr hole cover with a "pac-man" style drain hole, then tunneled through the skin to a separate exit site. The bone flap was replaced with attention to  making sure that the drain was free and able to be removed later.   All instrument and sponge counts were correct, the incision was then closed in layers. The patient was then returned to anesthesia for emergence. No apparent complications at the completion of the procedure.   EBL:  179mL   DRAINS: Ventricular catheter in the subdural space   SPECIMENS: none   Judith Part, MD 05/25/19 12:01 PM

## 2019-05-25 NOTE — Progress Notes (Deleted)
[facility] [address] [place]. [place] [address] [phone]       HOSPITAL FOLLOW UP NOTE  [name] Date of Birth:  1950-04-18 Medical Record Number:  [id number]   Reason for Referral:  hospital stroke follow up    SUBJECTIVE:   CHIEF COMPLAINT:  No chief complaint on file.   HPI:     ROS:   14 system review of systems performed and negative with exception of ***  PMH:  Past Medical History:  Diagnosis Date  . Aspiration pneumonia ([place])   . Bipolar 1 disorder ([place])   . Constipation   . Dysphagia   . Hypercholesterolemia   . Psoriasis   . Stroke [facility])    ischemic stroke  . Subdural hematoma ([place])   . Thyroid disease    hypothyroid    PSH:  Past Surgical History:  Procedure Laterality Date  . FOOT SURGERY  08/23/2003  . WRIST FRACTURE SURGERY Left     Social History:  Social History   Socioeconomic History  . Marital status: Divorced    Spouse name: Not on file  . Number of children: Not on file  . Years of education: Not on file  . Highest education level: Not on file  Occupational History  . Not on file  Tobacco Use  . Smoking status: Former Smoker    Types: Cigarettes  . Smokeless tobacco: Never Used  Substance and Sexual Activity  . Alcohol use: No  . Drug use: No  . Sexual activity: Not on file  Other Topics Concern  . Not on file  Social History Narrative  . Not on file   Social Determinants of Health   Financial Resource Strain:   . Difficulty of Paying Living Expenses:   Food Insecurity:   . Worried About Charity fundraiser in the Last Year:   . Arboriculturist in the Last Year:   Transportation Needs:   . Film/video editor (Medical):   Marland Kitchen Lack of Transportation (Non-Medical):   Physical Activity:   . Days of Exercise per Week:   . Minutes of Exercise per Session:   Stress:   . Feeling of Stress :   Social Connections:   . Frequency of Communication with Friends and Family:   .  Frequency of Social Gatherings with Friends and Family:   . Attends Religious Services:   . Active Member of Clubs or Organizations:   . Attends Archivist Meetings:   Marland Kitchen Marital Status:   Intimate Partner Violence:   . Fear of Current or Ex-Partner:   . Emotionally Abused:   Marland Kitchen Physically Abused:   . Sexually Abused:     Family History:  Family History  Problem Relation Age of Onset  . Alcohol abuse Brother   . Colon cancer Neg Hx     Medications:   Current Facility-Administered Medications on File Prior to Visit  Medication Dose Route Frequency Provider Last Rate Last Admin  . 0.9 %  sodium chloride infusion   Intravenous Continuous Belinda Block, MD   New Bag at 05/25/19 1253  . labetalol (NORMODYNE) injection 10-40 mg  10-40 mg Intravenous Q10 min PRN Judith Part, MD       Current Outpatient Medications on File Prior to Visit  Medication Sig Dispense Refill  . atorvastatin (LIPITOR) 40 MG tablet Take 1 tablet (40 mg total) by mouth daily at 6 PM.    . Calcipotriene-Betameth Diprop (ENSTILAR) 0.005-0.064 %  FOAM Apply topically. Use as directed    . clotrimazole-betamethasone (LOTRISONE) cream Apply 1 application topically 2 (two) times daily as needed (rash).    . divalproex (DEPAKOTE) 250 MG DR tablet Take 1 tablet (250 mg total) by mouth at bedtime. 90 tablet 0  . dronabinol (MARINOL) 2.5 MG capsule Take 2.5 mg by mouth 2 (two) times daily before a meal.    . famotidine (PEPCID) 20 MG tablet Take 1 tablet (20 mg total) by mouth as needed for heartburn or indigestion (May use once to twice daily). (Patient taking differently: Take 20 mg by mouth 2 (two) times daily as needed for heartburn or indigestion (May use once to twice daily). ) 60 tablet 5  . hydrocortisone 2.5 % cream Apply 1 application topically daily. *Apply to face, scalp, neck, and behind the ears    . ketoconazole (NIZORAL) 2 % cream Apply 1 application topically daily. Applied to the face,  scalp, neck, and behind the ears    . ketoconazole (NIZORAL) 2 % shampoo Apply 1 application topically daily.    Marland Kitchen levETIRAcetam (KEPPRA) 500 MG tablet Take 1 tablet (500 mg total) by mouth 2 (two) times daily.    Marland Kitchen levothyroxine (SYNTHROID) 88 MCG tablet Take 1 tablet (88 mcg total) by mouth daily. 30 tablet 11  . Magnesium Oxide (MAG-OXIDE) 200 MG TABS Take 2 tablets (400 mg total) by mouth in the morning and at bedtime.  0  . PARoxetine (PAXIL) 20 MG tablet Take 1 tablet (20 mg total) by mouth daily. 30 tablet 2  . QUEtiapine (SEROQUEL) 400 MG tablet Take 1 tablet (400 mg total) by mouth at bedtime.    Marland Kitchen QUEtiapine (SEROQUEL) 50 MG tablet Take 1 tablet (50 mg total) by mouth at bedtime.    . sennosides-docusate sodium (SENOKOT-S) 8.6-50 MG tablet Take 1 tablet by mouth daily as needed for constipation.      Allergies:   Allergies  Allergen Reactions  . Penicillins Swelling    Has patient had a PCN reaction causing immediate rash, facial/tongue/throat swelling, SOB or lightheadedness with hypotension: Unknown Has patient had a PCN reaction causing severe rash involving mucus membranes or skin necrosis: Unknown Has patient had a PCN reaction that required hospitalization: Unknown Has patient had a PCN reaction occurring within the last 10 years: No If all of the above answers are "NO", then may proceed with Cephalosporin use.       OBJECTIVE:  Physical Exam  There were no vitals filed for this visit. There is no height or weight on file to calculate BMI. No exam data present  No flowsheet data found.   General: well developed, well nourished, seated, in no evident distress Head: head normocephalic and atraumatic.   Neck: supple with no carotid or supraclavicular bruits Cardiovascular: regular rate and rhythm, no murmurs Musculoskeletal: no deformity Skin:  no rash/petichiae Vascular:  Normal pulses all extremities   Neurologic Exam Mental Status: Awake and fully alert.  Oriented to place and time. Recent and remote memory intact. Attention span, concentration and fund of knowledge appropriate. Mood and affect appropriate.  Cranial Nerves: Fundoscopic exam reveals sharp disc margins. Pupils equal, briskly reactive to light. Extraocular movements full without nystagmus. Visual fields full to confrontation. Hearing intact. Facial sensation intact. Face, tongue, palate moves normally and symmetrically.  Motor: Normal bulk and tone. Normal strength in all tested extremity muscles. Sensory.: intact to touch , pinprick , position and vibratory sensation.  Coordination: Rapid alternating movements normal in all  extremities. Finger-to-nose and heel-to-shin performed accurately bilaterally. Gait and Station: Arises from chair without difficulty. Stance is normal. Gait demonstrates normal stride length and balance Reflexes: 1+ and symmetric. Toes downgoing.     NIHSS  *** Modified Rankin  *** CHA2DS2-VASc *** HAS-BLED ***     ASSESSMENT: Ricky Ward is a 69 y.o. year old male presented with *** on *** secondary to ***. Vascular risk factors include ***.      PLAN:  1. *** : Continue {anticoagulants:31417}  and ***  for secondary stroke prevention. Maintain strict control of hypertension with blood pressure goal below 130/90, diabetes with hemoglobin A1c goal below 6.5% and cholesterol with LDL cholesterol (bad cholesterol) goal below 70 mg/dL.  I also advised the patient to eat a healthy diet with plenty of whole grains, cereals, fruits and vegetables, exercise regularly with at least 30 minutes of continuous activity daily and maintain ideal body weight. 2. HTN: Advised to continue current treatment regimen.  Today's BP ***.  Advised to continue to monitor at home along with continued follow-up with PCP for management 3. HLD: Advised to continue current treatment regimen along with continued follow-up with PCP for future prescribing and monitoring of lipid  panel 4. DMII: Advised to continue to monitor glucose levels at home along with continued follow-up with PCP for management and monitoring    Follow up in *** or call earlier if needed   I spent *** minutes of face-to-face and non-face-to-face time with patient.  This included previsit chart review, lab review, study review, order entry, electronic health record documentation, patient education regarding recent stroke, residual deficits, importance of managing stroke risk factors and answered all questions to patient satisfaction     Frann Rider, [facility]  [facility] [address] [place], [place] [address]  Phone [phone] Fax [phone] Note: This document was prepared with digital dictation and possible smart phrase technology. Any transcriptional errors that result from this process are unintentional.

## 2019-05-25 NOTE — Transfer of Care (Signed)
Immediate Anesthesia Transfer of Care Note  Patient: Ricky Ward  Procedure(s) Performed: CRANIOTOMY HEMATOMA EVACUATION SUBDURAL (Left Head)  Patient Location: PACU  Anesthesia Type:General  Level of Consciousness: drowsy  Airway & Oxygen Therapy: Patient Spontanous Breathing and Patient connected to nasal cannula oxygen  Post-op Assessment: Report given to RN and Post -op Vital signs reviewed and stable  Post vital signs: Reviewed and stable  Last Vitals:  Vitals Value Taken Time  BP 124/69 05/25/19 1519  Temp 36.2 C 05/25/19 1519  Pulse 79 05/25/19 1529  Resp 14 05/25/19 1529  SpO2 95 % 05/25/19 1529  Vitals shown include unvalidated device data.  Last Pain:  Vitals:   05/25/19 1519  TempSrc:   PainSc: Asleep         Complications: No apparent anesthesia complications

## 2019-05-25 NOTE — Progress Notes (Signed)
Neurosurgery Service Post-operative progress note  Assessment & Plan: 69 y.o. man s/p crani for SDH, seen in PACU, awake/alert, Ox1, FC poorly but will squeeze hands / wiggle toes reflexively, appears grossly symmetric.  -admit to 4N -continue subdural drain below head level -advance diet as tolerated -CTH today/tonight, scan ordered -hold SQH until drain is out, likely POD2 or POD3 -updated pt's sister  Judith Part  05/25/19 3:51 PM

## 2019-05-26 ENCOUNTER — Telehealth (HOSPITAL_COMMUNITY): Payer: Self-pay | Admitting: *Deleted

## 2019-05-26 ENCOUNTER — Other Ambulatory Visit (HOSPITAL_COMMUNITY): Payer: Self-pay | Admitting: Psychiatry

## 2019-05-26 ENCOUNTER — Inpatient Hospital Stay: Payer: Medicare Other | Admitting: Adult Health

## 2019-05-26 NOTE — Telephone Encounter (Signed)
[facility] CALLED STATED THEY SENT A E-SCRIPT ON  05/25/2019 FOR ::   QUEtiapine (SEROQUEL) 400 && 50 MG tablet.

## 2019-05-26 NOTE — Telephone Encounter (Signed)
PATIENT  IS A PATIENT @ [facility] PER Rx LAST SCRIPT WAS SENT BY DR Arville Lime & THEY ARE FILLING MONTHLY PILL PACK & NEEDS TO COMPLETE ORDER

## 2019-05-26 NOTE — Telephone Encounter (Signed)
According to the chart, he has been admitted to the hospital since yesterday. Will hold this refill for now given there can be a change in his medication.

## 2019-05-26 NOTE — Plan of Care (Signed)
  Problem: Education: Goal: Knowledge of General Education information will improve Description: Including pain rating scale, medication(s)/side effects and non-pharmacologic comfort measures Outcome: Progressing   Problem: Health Behavior/Discharge Planning: Goal: Ability to manage health-related needs will improve Outcome: Progressing   Problem: Clinical Measurements: Goal: Ability to maintain clinical measurements within normal limits will improve Outcome: Progressing Goal: Will remain free from infection Outcome: Progressing Goal: Diagnostic test results will improve Outcome: Progressing Goal: Respiratory complications will improve Outcome: Progressing Goal: Cardiovascular complication will be avoided Outcome: Progressing   Problem: Activity: Goal: Risk for activity intolerance will decrease Outcome: Progressing   Problem: Nutrition: Goal: Adequate nutrition will be maintained Outcome: Progressing   Problem: Coping: Goal: Level of anxiety will decrease Outcome: Progressing   Problem: Elimination: Goal: Will not experience complications related to bowel motility Outcome: Progressing Goal: Will not experience complications related to urinary retention Outcome: Progressing   Problem: Pain Managment: Goal: General experience of comfort will improve Outcome: Progressing   Problem: Safety: Goal: Ability to remain free from injury will improve Outcome: Progressing   Problem: Skin Integrity: Goal: Risk for impaired skin integrity will decrease Outcome: Progressing   Problem: Clinical Measurements: Goal: Usual level of consciousness will be regained or maintained. Outcome: Progressing Goal: Neurologic status will improve Outcome: Progressing   Problem: Skin Integrity: Goal: Demonstration of wound healing without infection will improve Outcome: Progressing

## 2019-05-26 NOTE — Telephone Encounter (Signed)
CALLED & SPOKE  WITH  Rx PER PROVIDER:: According to the chart, he has been admitted to the hospital since yesterday. Will hold this refill for now given there can be a change in his medication.  Rx WILL NOTE HIS RECORDS

## 2019-05-26 NOTE — Progress Notes (Signed)
Verbal OK from PharmD to administer Keppra crushed in apple sauce.

## 2019-05-26 NOTE — Evaluation (Signed)
Occupational Therapy Evaluation Patient Details Name: Ricky Ward MRN: [id number] DOB: 1950/08/20 Today's Date: 05/26/2019    History of Present Illness Pt is 69 yo male who presented with L sided weakness after a fall.  Pt found to have large L hemispheric subdural hematoma with rightward midline shift of 7 mm, L finger laceration, and small acute R frontal MCA infarcts.  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 Per neurosurgy note - symptoms resolving.  Pt with PMH including bipolar, hypothyroidism, hyperlipidemia.   Clinical Impression   Per chart review, pt was living at [facility], performed mobility without DME, and received assistance for bathing; unsure of PLOF as pt poor historian. Pt currently requiring Min A for UB ADLs, Mod-Max A for LB ADLs, and Mod-Max A for functional transfers. Pt presenting with poor cognition, highly distractible, and decreased following of simple commands. During functional transfers, pt presenting with posterior lean and shuffling pattern. Very agreeable to therapy. Pt would benefit from further acute OT to facilitate safe dc. Recommend dc to SNF for further OT to optimize safety, independence with ADLs, and return to PLOF.     Follow Up Recommendations  SNF;Supervision/Assistance - 24 hour    Equipment Recommendations  Other (comment)(Defer to next venue)    Recommendations for Other Services PT consult     Precautions / Restrictions Precautions Precautions: Fall Restrictions Weight Bearing Restrictions: No      Mobility Bed Mobility Overal bed mobility: Needs Assistance Bed Mobility: Supine to Sit     Supine to sit: Min assist;HOB elevated     General bed mobility comments: Min A for elevating trunk and initating bringing BELs towards EOB. When cueing pt to sit at EOB, pt scooting his body laterally and requiring tactile cues to rotate hips towards EOB. Pt benefiting from cues to stand and then he turned towards  EOB  Transfers Overall transfer level: Needs assistance Equipment used: 1 person hand held assist Transfers: Sit to/from [facility] Sit to Stand: Mod assist;Max assist Stand pivot transfers: Mod assist       General transfer comment: Mod A for power up into standing and then Max A for maintaining balance with posterior lean. Mod A for balance during pivot to recliner with max cues. Pt with tendency for bend forward to reach for chair handle and requiring cues for safety. Noting shuffling of feeting during pivot    Balance Overall balance assessment: Needs assistance Sitting-balance support: No upper extremity supported;Feet supported Sitting balance-Leahy Scale: Fair     Standing balance support: Single extremity supported;During functional activity Standing balance-Leahy Scale: Poor                             ADL either performed or assessed with clinical judgement   ADL Overall ADL's : Needs assistance/impaired Eating/Feeding: Set up;Supervision/ safety;Sitting   Grooming: Minimal assistance;Sitting   Upper Body Bathing: Minimal assistance;Sitting   Lower Body Bathing: Moderate assistance;Sit to/from stand;Maximal assistance   Upper Body Dressing : Minimal assistance;Sitting   Lower Body Dressing: Moderate assistance;Sit to/from stand;Maximal assistance Lower Body Dressing Details (indicate cue type and reason): Pt donning socks while in bed bringing BLEs up (using figure four for RLE). Mod-Max A for standing balance with posterior lean Toilet Transfer: Moderate assistance;Stand-pivot(simulated to recline)           Functional mobility during ADLs: Moderate assistance(stand pivot)       Vision Patient Visual  Report: Other (comment)(Pt denies use of glasses)       Perception     Praxis      Pertinent Vitals/Pain Pain Assessment: No/denies pain     Hand Dominance Right   Extremity/Trunk Assessment Upper Extremity  Assessment Upper Extremity Assessment: Generalized weakness   Lower Extremity Assessment Lower Extremity Assessment: Defer to PT evaluation   Cervical / Trunk Assessment Cervical / Trunk Assessment: Kyphotic   Communication Communication Communication: Expressive difficulties(noting parroting)   Cognition Arousal/Alertness: Awake/alert Behavior During Therapy: Flat affect(Slightly restless and picking at objects and clothing with h) Overall Cognitive Status: Impaired/Different from baseline Area of Impairment: Orientation;Attention;Memory;Following commands;Safety/judgement;Awareness;Problem solving                 Orientation Level: Disoriented to;Place;Time;Situation Current Attention Level: Focused;Sustained Memory: Decreased short-term memory Following Commands: Follows one step commands inconsistently;Follows one step commands with increased time Safety/Judgement: Decreased awareness of safety;Decreased awareness of deficits Awareness: Intellectual Problem Solving: Slow processing;Decreased initiation;Requires verbal cues;Requires tactile cues General Comments: Per RN, Dr. Zada Finders saw this patient PTA and he was not this inattentive and "fidgety". Pt currently presenting with poor attention, awareness, problem solving, and safety. Pt parroting therapists and answering ~30% of simple questions such as "do you like your breakfast" and pt saying "yes ma'am."  Pt only saying yes/no questions. Pt with poor following of commands and requiring direct, simple cues.   General Comments  VSS throughout. drain clamped by RN    Exercises     Shoulder Instructions      Home Living Family/patient expects to be discharged to:: Assisted living                                 Additional Comments: [facility] per chart      Prior Functioning/Environment Level of Independence: Needs assistance  Gait / Transfers Assistance Needed: Reports he does not use DME ADL's /  Homemaking Assistance Needed: Per chart, assistance with showering            OT Problem List: Decreased strength;Decreased range of motion;Decreased activity tolerance;Impaired balance (sitting and/or standing);Decreased knowledge of precautions;Decreased knowledge of use of DME or AE;Decreased cognition;Decreased safety awareness      OT Treatment/Interventions: Self-care/ADL training;Therapeutic exercise;Energy conservation;DME and/or AE instruction;Therapeutic activities;Patient/family education    OT Goals(Current goals can be found in the care plan section) Acute Rehab OT Goals Patient Stated Goal: Unstated; agreeable to therapy and to sit in chair for breakfast OT Goal Formulation: Patient unable to participate in goal setting Time For Goal Achievement: 06/09/19 Potential to Achieve Goals: Good  OT Frequency: Min 2X/week   Barriers to D/C:            Co-evaluation              AM-PAC OT "6 Clicks" Daily Activity     Outcome Measure Help from another person eating meals?: A Little Help from another person taking care of personal grooming?: A Little Help from another person toileting, which includes using toliet, bedpan, or urinal?: A Lot Help from another person bathing (including washing, rinsing, drying)?: A Lot Help from another person to put on and taking off regular upper body clothing?: A Little Help from another person to put on and taking off regular lower body clothing?: A Lot 6 Click Score: 15   End of Session Equipment Utilized During Treatment: Gait belt Nurse Communication: Mobility status  Activity Tolerance: Patient  tolerated treatment well Patient left: in chair;with call bell/phone within reach;with chair alarm set  OT Visit Diagnosis: Unsteadiness on feet (R26.81);Other abnormalities of gait and mobility (R26.89);Muscle weakness (generalized) (M62.81);Other symptoms and signs involving cognitive function                Time: [id number] OT Time  Calculation (min): 21 min Charges:  OT General Charges $OT Visit: 1 Visit OT Evaluation $OT Eval Moderate Complexity: [place], OTR/L Acute Rehab Pager: [phone] Office: [place] 05/26/2019, 9:58 AM

## 2019-05-26 NOTE — Telephone Encounter (Signed)
It seemed like the medication were ordered by Dr. Emelda Brothers. On another note, please contact the patient to make follow up appointment.

## 2019-05-26 NOTE — Evaluation (Signed)
Physical Therapy Evaluation Patient Details Name: Ricky Ward MRN: [id number] DOB: May 22, 1950 Today's Date: 05/26/2019   History of Present Illness  Pt is 69 yo male who presented with L sided weakness after a fall.  Pt found to have large L hemispheric subdural hematoma with rightward midline shift of 7 mm, L finger laceration, and small acute R frontal MCA infarcts.Pt s/p craniotomy hematoma evacuation on 05/25/19.  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  Pt with PMH including bipolar, hypothyroidism, hyperlipidemia.  Clinical Impression  Pt was able to tolerate standing and very minimal gait with shuffling pattern (pt would be safer to progress gait with two person assist).  He lives in ALF at baseline and will likely needs short term SNF level rehab before returning to ALF level of care.   PT to follow acutely for deficits listed below.      Follow Up Recommendations SNF    Equipment Recommendations  None recommended by PT    Recommendations for Other Services   NA     Precautions / Restrictions Precautions Precautions: Fall;Other (comment) Precaution Comments: subdural drain      Mobility  Bed Mobility               General bed mobility comments: Pt was OOB in the recliner chair.   Transfers Overall transfer level: Needs assistance Equipment used: Rolling walker (2 wheeled) Transfers: Sit to/from Stand Sit to Stand: Mod assist         General transfer comment: Mod assist first stand and min assist second stand to power up.  PT used RW for stability in standing, but you can tell he is not used to using a RW as he would let go of it without warning, but this could also be a result of his short attention.   Ambulation/Gait Ambulation/Gait assistance: Min assist Gait Distance (Feet): 3 Feet Assistive device: Rolling walker (2 wheeled) Gait Pattern/deviations: Step-through pattern;Shuffle;Trunk flexed     General Gait Details: Pt with very short,  shuffling steps, used RW to step back and forth close to the chair a few times, posterior preference, especially when backing up.  Would be safer with second person to progress gait further.          Balance Overall balance assessment: Needs assistance Sitting-balance support: Feet supported;Bilateral upper extremity supported Sitting balance-Leahy Scale: Fair     Standing balance support: Bilateral upper extremity supported Standing balance-Leahy Scale: Poor Standing balance comment: needs external support in standing.                              Pertinent Vitals/Pain Pain Assessment: No/denies pain    Home Living Family/patient expects to be discharged to:: Assisted living               Home Equipment: Walker - 4 wheels;Shower seat Additional Comments: [employer] per chart    Prior Function Level of Independence: Needs assistance   Gait / Transfers Assistance Needed: Reports he does not use DME  ADL's / Homemaking Assistance Needed: Per chart, assistance with showering        Hand Dominance   Dominant Hand: Right    Extremity/Trunk Assessment   Upper Extremity Assessment Upper Extremity Assessment: Defer to OT evaluation    Lower Extremity Assessment Lower Extremity Assessment: Generalized weakness    Cervical / Trunk Assessment Cervical / Trunk Assessment: Kyphotic  Communication   Communication: Expressive difficulties(at  times repeats what is said to him)  Cognition Arousal/Alertness: Awake/alert Behavior During Therapy: Restless;Flat affect(fidgets) Overall Cognitive Status: Impaired/Different from baseline Area of Impairment: Orientation;Attention;Memory;Following commands;Safety/judgement;Awareness;Problem solving                 Orientation Level: Disoriented to;Situation;Place Current Attention Level: Sustained Memory: Decreased short-term memory Following Commands: Follows one step commands inconsistently;Follows one  step commands with increased time Safety/Judgement: Decreased awareness of safety;Decreased awareness of deficits Awareness: Intellectual Problem Solving: Slow processing;Decreased initiation;Requires verbal cues;Requires tactile cues General Comments: Pt able to state his name (not on first try), age and birthday, easily distracted by lines.        General Comments General comments (skin integrity, edema, etc.): VSS throughout, subdural drain clamped, even though we think we did not need it clamped as it is not a ventric drain, but did so just in case.         Assessment/Plan    PT Assessment Patient needs continued PT services  PT Problem List Decreased strength;Decreased activity tolerance;Decreased balance;Decreased mobility;Decreased knowledge of use of DME;Decreased safety awareness;Decreased knowledge of precautions       PT Treatment Interventions DME instruction;Gait training;Functional mobility training;Therapeutic activities;Therapeutic exercise;Balance training;Neuromuscular re-education;Cognitive remediation;Patient/family education    PT Goals (Current goals can be found in the Care Plan section)  Acute Rehab PT Goals Patient Stated Goal: unable to state PT Goal Formulation: Patient unable to participate in goal setting Time For Goal Achievement: 06/09/19 Potential to Achieve Goals: Good    Frequency Min 2X/week    AM-PAC PT "6 Clicks" Mobility  Outcome Measure Help needed turning from your back to your side while in a flat bed without using bedrails?: A Lot Help needed moving from lying on your back to sitting on the side of a flat bed without using bedrails?: A Lot Help needed moving to and from a bed to a chair (including a wheelchair)?: A Lot Help needed standing up from a chair using your arms (e.g., wheelchair or bedside chair)?: A Lot Help needed to walk in hospital room?: A Lot Help needed climbing 3-5 steps with a railing? : Total 6 Click Score: 11     End of Session   Activity Tolerance: Patient tolerated treatment well Patient left: in chair;with call bell/phone within reach;with chair alarm set Nurse Communication: Mobility status PT Visit Diagnosis: Muscle weakness (generalized) (M62.81);Difficulty in walking, not elsewhere classified (R26.2);Other symptoms and signs involving the nervous system [id number])    Time: [id number] PT Time Calculation (min) (ACUTE ONLY): 21 min   Charges:      Verdene Lennert, PT, DPT  Acute Rehabilitation [phone] pager #[phone] office     PT Evaluation $PT Eval Moderate Complexity: 1 Mod          05/26/2019, 4:45 PM

## 2019-05-26 NOTE — Progress Notes (Signed)
Neurosurgery Service Progress Note  Subjective: No acute events overnight, no new complaints, significantly improved from preop   Objective: Vitals:   05/26/19 1300 05/26/19 1400 05/26/19 1500 05/26/19 1600  BP: 98/69 117/68    Pulse: 87 84 (!) 102 (!) 102  Resp: 14 16 (!) 23 17  Temp:    98.9 F (37.2 C)  TempSrc:    Oral  SpO2: 95% 93% 94% 95%  Weight:      Height:       Temp (24hrs), Avg:98.3 F (36.8 C), Min:97.6 F (36.4 C), Max:98.9 F (37.2 C)  CBC Latest Ref Rng & Units 05/25/2019 04/23/2019 04/23/2019  WBC 4.0 - 10.5 K/uL 9.4 7.5 7.1  Hemoglobin 13.0 - 17.0 g/dL 14.6 13.3 12.8(L)  Hematocrit 39.0 - 52.0 % 44.5 40.1 40.3  Platelets 150 - 400 K/uL 280 398 396   BMP Latest Ref Rng & Units 05/25/2019 04/24/2019 04/23/2019  Glucose 70 - 99 mg/dL 162(H) 111(H) 125(H)  BUN 8 - 23 mg/dL 16 12 15   Creatinine 0.61 - 1.24 mg/dL 1.15 1.09 0.97  BUN/Creat Ratio 6 - 22 (calc) - - -  Sodium 135 - 145 mmol/L 142 138 133(L)  Potassium 3.5 - 5.1 mmol/L 4.1 4.0 5.8(H)  Chloride 98 - 111 mmol/L 102 103 103  CO2 22 - 32 mmol/L 28 23 19(L)  Calcium 8.9 - 10.3 mg/dL 9.4 8.9 8.6(L)    Intake/Output Summary (Last 24 hours) at 05/26/2019 1802 Last data filed at 05/26/2019 1600 Gross per 24 hour  Intake 220 ml  Output 677 ml  Net -457 ml    Current Facility-Administered Medications:  .  0.9 %  sodium chloride infusion, , Intravenous, Continuous, Belinda Block, MD, Last Rate: 10 mL/hr at 05/26/19 1400, Rate Change at 05/26/19 1400 .  acetaminophen (TYLENOL) tablet 650 mg, 650 mg, Oral, Q4H PRN **OR** acetaminophen (TYLENOL) suppository 650 mg, 650 mg, Rectal, Q4H PRN, Judith Part, MD .  atorvastatin (LIPITOR) tablet 40 mg, 40 mg, Oral, q1800, Judith Part, MD, 40 mg at 05/26/19 1650 .  Chlorhexidine Gluconate Cloth 2 % PADS 6 each, 6 each, Topical, Q0600, Jw Covin A, MD .  clotrimazole (LOTRIMIN) 1 % cream, , Topical, BID PRN, Judith Part, MD .   divalproex (DEPAKOTE) DR tablet 250 mg, 250 mg, Oral, QHS, Salahuddin Arismendez, Joyice Faster, MD, 250 mg at 05/25/19 2125 .  docusate sodium (COLACE) capsule 100 mg, 100 mg, Oral, BID, Judith Part, MD, 100 mg at 05/25/19 2125 .  dronabinol (MARINOL) capsule 2.5 mg, 2.5 mg, Oral, BID AC, Alejo Beamer A, MD, 2.5 mg at 05/26/19 1650 .  famotidine (PEPCID) tablet 20 mg, 20 mg, Oral, Daily, Judith Part, MD, 20 mg at 05/26/19 0955 .  [START ON 05/28/2019] heparin injection 5,000 Units, 5,000 Units, Subcutaneous, Q8H, Tynan Boesel A, MD .  HYDROcodone-acetaminophen (NORCO/VICODIN) 5-325 MG per tablet 1 tablet, 1 tablet, Oral, Q4H PRN, Judith Part, MD .  hydrocortisone cream 1 % 1 application, 1 application, Topical, Daily, Harlin Mazzoni, Joyice Faster, MD, 1 application at [id number] .  HYDROmorphone (DILAUDID) injection 0.5 mg, 0.5 mg, Intravenous, Q3H PRN, Judith Part, MD .  ketoconazole (NIZORAL) 2 % cream 1 application, 1 application, Topical, Daily, Judith Part, MD, 1 application at [id number] .  labetalol (NORMODYNE) injection 10-40 mg, 10-40 mg, Intravenous, Q10 min PRN, Judith Part, MD, 20 mg at 05/25/19 1659 .  levETIRAcetam (KEPPRA) tablet 500 mg, 500 mg, Oral, BID, Emelda Brothers  A, MD, 500 mg at 05/26/19 0956 .  levothyroxine (SYNTHROID) tablet 88 mcg, 88 mcg, Oral, Q0600, Judith Part, MD, 88 mcg at 05/26/19 0538 .  ondansetron (ZOFRAN) tablet 4 mg, 4 mg, Oral, Q4H PRN **OR** ondansetron (ZOFRAN) injection 4 mg, 4 mg, Intravenous, Q4H PRN, Ara Grandmaison A, MD .  PARoxetine (PAXIL) tablet 20 mg, 20 mg, Oral, Daily, Marcele Kosta, Joyice Faster, MD, 20 mg at 05/26/19 0955 .  polyethylene glycol (MIRALAX / GLYCOLAX) packet 17 g, 17 g, Oral, Daily PRN, Judith Part, MD .  promethazine (PHENERGAN) tablet 12.5-25 mg, 12.5-25 mg, Oral, Q4H PRN, Judith Part, MD .  QUEtiapine (SEROQUEL) tablet 400 mg, 400 mg, Oral, QHS, Juliannah Ohmann, Joyice Faster, MD,  400 mg at 05/25/19 2124 .  senna-docusate (Senokot-S) tablet 1 tablet, 1 tablet, Oral, Daily PRN, Judith Part, MD   Physical Exam: Awake / alert, occasionally slow to answer but Ox3, PERRL with baseline XT, FCx4 with full strength Drain in place w/ serosang drainage Incision c/d/i  Assessment & Plan: 69 y.o. man s/p craniotomy for 3cm SDH, recovering well. CTH with good evacuation, expected pneumocephalus.  -cont drain, will likely d/c drain in the morning -hold SQH until drain out  Judith Part  05/26/19 6:02 PM

## 2019-05-27 ENCOUNTER — Inpatient Hospital Stay (HOSPITAL_COMMUNITY): Payer: Medicare Other

## 2019-05-27 DIAGNOSIS — G9340 Encephalopathy, unspecified: Secondary | ICD-10-CM

## 2019-05-27 DIAGNOSIS — R4182 Altered mental status, unspecified: Secondary | ICD-10-CM

## 2019-05-27 DIAGNOSIS — R4781 Slurred speech: Secondary | ICD-10-CM

## 2019-05-27 MED ORDER — CHLORHEXIDINE GLUCONATE CLOTH 2 % EX PADS
6.0000 | MEDICATED_PAD | Freq: Every day | CUTANEOUS | Status: DC
  Administered 2019-05-27 – 2019-06-01 (×5): 6 via TOPICAL

## 2019-05-27 NOTE — Procedures (Signed)
Patient Name: Ricky Ward  MRN: [id number]  Epilepsy Attending: Lora Havens  Referring Physician/Provider: Dr. Deatra Ina Date: 05/27/2019 Duration: 25.36 minutes  Patient history: 69 year old male status post craniotomy after left-sided SDH.  EEG to evaluate for seizures.  Level of alertness: awake  AEDs during EEG study: Keppra, Depakote  Technical aspects: This EEG study was done with scalp electrodes positioned according to the 10-20 International system of electrode placement. Electrical activity was acquired at a sampling rate of 500Hz  and reviewed with a high frequency filter of 70Hz  and a low frequency filter of 1Hz . EEG data were recorded continuously and digitally stored.   Description: No clear posterior dominant was seen.  EEG showed continuous generalized 3 to 5 Hz theta-delta slowing, at times with triphasic morphology.  Sharp transients were also seen in left parieto-occipital region.  Hyperventilation and photic stimulation were not performed.  Abnormality -Continuous slow, generalized  IMPRESSION: This study is suggestive of moderate diffuse encephalopathy, nonspecific etiology. No seizures or definite epileptiform discharges were seen throughout the recording.  [name] Barbra Sarks

## 2019-05-27 NOTE — Progress Notes (Signed)
Patient neurological assessment waxing and waning throughout day, patient unable to tolerate majority of PO medications today d/t inability to consistently FC and swallow foods/drinks. Zada Finders, MD notified.  Candy Sledge, RN

## 2019-05-27 NOTE — Progress Notes (Signed)
Neurosurgery Service Progress Note  Subjective: No acute events overnight, no new complaints, mental status still improved, but less bright than yesterday  Objective: Vitals:   05/27/19 0500 05/27/19 0600 05/27/19 0700 05/27/19 0800  BP: (!) 109/59 (!) 116/51 (!) 113/58 130/68  Pulse: 98 91 85 90  Resp: 17 15 16 12   Temp:      TempSrc:      SpO2: 94% 93% 93% 93%  Weight:      Height:       Temp (24hrs), Avg:99.2 F (37.3 C), Min:98.9 F (37.2 C), Max:99.6 F (37.6 C)  CBC Latest Ref Rng & Units 05/25/2019 04/23/2019 04/23/2019  WBC 4.0 - 10.5 K/uL 9.4 7.5 7.1  Hemoglobin 13.0 - 17.0 g/dL 14.6 13.3 12.8(L)  Hematocrit 39.0 - 52.0 % 44.5 40.1 40.3  Platelets 150 - 400 K/uL 280 398 396   BMP Latest Ref Rng & Units 05/25/2019 04/24/2019 04/23/2019  Glucose 70 - 99 mg/dL 162(H) 111(H) 125(H)  BUN 8 - 23 mg/dL 16 12 15   Creatinine 0.61 - 1.24 mg/dL 1.15 1.09 0.97  BUN/Creat Ratio 6 - 22 (calc) - - -  Sodium 135 - 145 mmol/L 142 138 133(L)  Potassium 3.5 - 5.1 mmol/L 4.1 4.0 5.8(H)  Chloride 98 - 111 mmol/L 102 103 103  CO2 22 - 32 mmol/L 28 23 19(L)  Calcium 8.9 - 10.3 mg/dL 9.4 8.9 8.6(L)    Intake/Output Summary (Last 24 hours) at 05/27/2019 0908 Last data filed at 05/27/2019 0800 Gross per 24 hour  Intake 230 ml  Output 375 ml  Net -145 ml    Current Facility-Administered Medications:  .  0.9 %  sodium chloride infusion, , Intravenous, Continuous, Belinda Block, MD, Last Rate: 10 mL/hr at 05/26/19 1400, Rate Change at 05/26/19 1400 .  acetaminophen (TYLENOL) tablet 650 mg, 650 mg, Oral, Q4H PRN **OR** acetaminophen (TYLENOL) suppository 650 mg, 650 mg, Rectal, Q4H PRN, Judith Part, MD .  atorvastatin (LIPITOR) tablet 40 mg, 40 mg, Oral, q1800, Judith Part, MD, 40 mg at 05/26/19 1650 .  Chlorhexidine Gluconate Cloth 2 % PADS 6 each, 6 each, Topical, Q0600, Zykeem Bauserman A, MD .  clotrimazole (LOTRIMIN) 1 % cream, , Topical, BID PRN, Judith Part,  MD .  divalproex (DEPAKOTE) DR tablet 250 mg, 250 mg, Oral, QHS, Rylan Kaufmann, Joyice Faster, MD, 250 mg at 05/26/19 2312 .  docusate sodium (COLACE) capsule 100 mg, 100 mg, Oral, BID, Judith Part, MD, 100 mg at 05/26/19 2312 .  dronabinol (MARINOL) capsule 2.5 mg, 2.5 mg, Oral, BID AC, Lailynn Southgate A, MD, 2.5 mg at 05/26/19 1650 .  famotidine (PEPCID) tablet 20 mg, 20 mg, Oral, Daily, Judith Part, MD, 20 mg at 05/26/19 0955 .  [START ON 05/28/2019] heparin injection 5,000 Units, 5,000 Units, Subcutaneous, Q8H, Bethel Gaglio A, MD .  HYDROcodone-acetaminophen (NORCO/VICODIN) 5-325 MG per tablet 1 tablet, 1 tablet, Oral, Q4H PRN, Judith Part, MD .  hydrocortisone cream 1 % 1 application, 1 application, Topical, Daily, Eidan Muellner, Joyice Faster, MD, 1 application at [id number] 0957 .  HYDROmorphone (DILAUDID) injection 0.5 mg, 0.5 mg, Intravenous, Q3H PRN, Judith Part, MD .  ketoconazole (NIZORAL) 2 % cream 1 application, 1 application, Topical, Daily, Judith Part, MD, 1 application at [id number] 0956 .  labetalol (NORMODYNE) injection 10-40 mg, 10-40 mg, Intravenous, Q10 min PRN, Judith Part, MD, 20 mg at 05/25/19 1659 .  levETIRAcetam (KEPPRA) tablet 500 mg, 500 mg, Oral, BID, Chrishauna Mee,  Joyice Faster, MD, 500 mg at 05/26/19 2312 .  levothyroxine (SYNTHROID) tablet 88 mcg, 88 mcg, Oral, Q0600, Judith Part, MD, 88 mcg at 05/27/19 0525 .  ondansetron (ZOFRAN) tablet 4 mg, 4 mg, Oral, Q4H PRN **OR** ondansetron (ZOFRAN) injection 4 mg, 4 mg, Intravenous, Q4H PRN, Masa Lubin A, MD .  PARoxetine (PAXIL) tablet 20 mg, 20 mg, Oral, Daily, Javion Holmer, Joyice Faster, MD, 20 mg at 05/26/19 0955 .  polyethylene glycol (MIRALAX / GLYCOLAX) packet 17 g, 17 g, Oral, Daily PRN, Alf Doyle A, MD .  promethazine (PHENERGAN) tablet 12.5-25 mg, 12.5-25 mg, Oral, Q4H PRN, Judith Part, MD .  QUEtiapine (SEROQUEL) tablet 400 mg, 400 mg, Oral, QHS, Vencent Hauschild, Joyice Faster, MD, 400 mg at 05/26/19 2312 .  senna-docusate (Senokot-S) tablet 1 tablet, 1 tablet, Oral, Daily PRN, Judith Part, MD   Physical Exam: Mildly somnolent but interactive, Ox2, PERRL with baseline XT, FCx4 with full strength Drain in place w/ serosang drainage Incision c/d/i  Assessment & Plan: 69 y.o. man s/p craniotomy for 3cm SDH, recovering well. CTH with good evacuation, expected pneumocephalus.  -d/c drain -hold SQH until POD3 -repeat CTH to re-evaluate, if negative then place EEG leads for continuous monitoring overnight -keep ICU status until improvement in mental status  Judith Part  05/27/19 9:08 AM

## 2019-05-27 NOTE — Progress Notes (Signed)
vLTM EEG started after spot eeg. Notified neuro

## 2019-05-27 NOTE — Progress Notes (Signed)
EEG completed, results pending LTM to follow

## 2019-05-28 ENCOUNTER — Inpatient Hospital Stay (HOSPITAL_COMMUNITY): Payer: Medicare Other

## 2019-05-28 DIAGNOSIS — R569 Unspecified convulsions: Secondary | ICD-10-CM

## 2019-05-28 LAB — BASIC METABOLIC PANEL
Anion gap: 11 (ref 5–15)
BUN: 16 mg/dL (ref 8–23)
CO2: 27 mmol/L (ref 22–32)
Calcium: 9 mg/dL (ref 8.9–10.3)
Chloride: 98 mmol/L (ref 98–111)
Creatinine, Ser: 1.19 mg/dL (ref 0.61–1.24)
GFR calc Af Amer: >60 mL/min (ref >60)
GFR calc non Af Amer: >60 mL/min (ref >60)
Glucose, Bld: 127 mg/dL — ABNORMAL HIGH (ref 70–99)
Potassium: 3.7 mmol/L (ref 3.5–5.1)
Sodium: 136 mmol/L (ref 135–145)

## 2019-05-28 LAB — URINALYSIS, ROUTINE W REFLEX MICROSCOPIC
Bilirubin Urine: NEGATIVE
Glucose, UA: NEGATIVE mg/dL
Hgb urine dipstick: NEGATIVE
Ketones, ur: 20 mg/dL — AB
Leukocytes,Ua: NEGATIVE
Nitrite: NEGATIVE
Protein, ur: NEGATIVE mg/dL
Specific Gravity, Urine: 1.017 (ref 1.005–1.030)
pH: 6 (ref 5.0–8.0)

## 2019-05-28 LAB — CBC
HCT: 38.5 % — ABNORMAL LOW (ref 39.0–52.0)
Hemoglobin: 13 g/dL (ref 13.0–17.0)
MCH: 32.5 pg (ref 26.0–34.0)
MCHC: 33.8 g/dL (ref 30.0–36.0)
MCV: 96.3 fL (ref 80.0–100.0)
Platelets: 241 10*3/uL (ref 150–400)
RBC: 4 MIL/uL — ABNORMAL LOW (ref 4.22–5.81)
RDW: 13.2 % (ref 11.5–15.5)
WBC: 9.6 10*3/uL (ref 4.0–10.5)
nRBC: 0 % (ref 0.0–0.2)

## 2019-05-28 MED ORDER — FUROSEMIDE 10 MG/ML IJ SOLN
20.0000 mg | Freq: Once | INTRAMUSCULAR | Status: DC
  Filled 2019-05-28: qty 2

## 2019-05-28 MED ORDER — LEVETIRACETAM IN NACL 500 MG/100ML IV SOLN
500.0000 mg | Freq: Two times a day (BID) | INTRAVENOUS | Status: DC
  Administered 2019-05-28 – 2019-05-31 (×7): 500 mg via INTRAVENOUS
  Filled 2019-05-28 (×6): qty 100

## 2019-05-28 MED ORDER — LEVOTHYROXINE SODIUM 100 MCG/5ML IV SOLN
44.0000 ug | Freq: Every day | INTRAVENOUS | Status: DC
  Administered 2019-05-29 – 2019-05-30 (×2): 44 ug via INTRAVENOUS
  Filled 2019-05-28 (×2): qty 5

## 2019-05-28 MED ORDER — ORAL CARE MOUTH RINSE
15.0000 mL | Freq: Two times a day (BID) | OROMUCOSAL | Status: DC
  Administered 2019-05-28 – 2019-06-08 (×23): 15 mL via OROMUCOSAL

## 2019-05-28 MED ORDER — CHLORHEXIDINE GLUCONATE 0.12 % MT SOLN
15.0000 mL | Freq: Two times a day (BID) | OROMUCOSAL | Status: DC
  Administered 2019-05-28 – 2019-06-08 (×23): 15 mL via OROMUCOSAL
  Filled 2019-05-28 (×18): qty 15

## 2019-05-28 NOTE — Progress Notes (Signed)
EEG maint complete. No skin breakdown at [place] A2 ground F3. continue to monitor

## 2019-05-28 NOTE — Procedures (Addendum)
Patient Name: Ricky Ward  MRN: [id number]  Epilepsy Attending: Lora Havens  Referring Physician/Provider: Dr. Deatra Ina Duration: 05/27/2019 1047 to 05/28/2019 1047  Patient history: 69 year old male status post craniotomy after left-sided SDH.  EEG to evaluate for seizures.  Level of alertness: awake/lethargic  AEDs during EEG study: Keppra, Depakote  Technical aspects: This EEG study was done with scalp electrodes positioned according to the 10-20 International system of electrode placement. Electrical activity was acquired at a sampling rate of 500Hz  and reviewed with a high frequency filter of 70Hz  and a low frequency filter of 1Hz . EEG data were recorded continuously and digitally stored.   Description: No clear posterior dominant was seen.  EEG showed continuous generalized and lateralized left hemisphere 3 to 5 Hz theta-delta slowing, at times with triphasic morphology. Sharp waves were also seen in left parieto-occipital region. Hyperventilation and photic stimulation were not performed.  Abnormality - Sharp waves, left parieto-occipital region -Continuous slow, generalized and lateralized left hemisphere   IMPRESSION: This study showed evidence of potential epileptogenicity in left parieto-occipital region.  There is also cortical dysfunction in left hemisphere likely secondary to underlying SDH. Additionally, there is evidence of moderate diffuse encephalopathy, nonspecific etiology. No seizures were seen throughout the recording.  Braulio Kiedrowski Barbra Sarks

## 2019-05-28 NOTE — Progress Notes (Signed)
  NEUROSURGERY PROGRESS NOTE   Patient npo due to possible aspirating sounds Confused, but per nursing waxes/wanes  EXAM:  BP 115/70   Pulse 88   Temp 98.6 F (37 C) (Oral)   Resp 17   Ht 5\' 7"  (1.702 m)   Wt 77.6 kg   SpO2 98%   BMI 26.79 kg/m   Awake Answers "yes" to all questions. Speech otherwise garbled Chest audibly congested Does not follow commands MAE to pain Incision: c/d/i  IMPRESSION/PLAN 69 y.o. male s/p left crani for SDH. Neuro status continues to wax/wane. - Appears to have difficulties with chest secretions. SLP made NPO. CXR ordered. Will increase fluids. - Will check labs and UA in addition to CXR due to mental status - EEG without seizure activity. Still ongoing. Appreciate assistance

## 2019-05-28 NOTE — Progress Notes (Signed)
Physical Therapy Treatment Patient Details Name: Ricky Ward MRN: [id number] DOB: [birth year] Today's Date: 05/28/2019    History of Present Illness Pt is 69 yo male who presented with L sided weakness after a fall.  Pt found to have large L hemispheric subdural hematoma with rightward midline shift of 7 mm, L finger laceration, and small acute R frontal MCA infarcts.Pt s/p craniotomy hematoma evacuation on 05/25/19.  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  Pt with PMH including bipolar, hypothyroidism, hyperlipidemia.    PT Comments    Pt with increased cognitive deficits today which are impacting his ability to follow commands and follow manual cues for initiating mobility.  I was unable to get him standing and he required more assist to transition from supine <-> sitting.  PT will continue to follow acutely for safe mobility progression.   Follow Up Recommendations  SNF     Equipment Recommendations  None recommended by PT    Recommendations for Other Services   NA     Precautions / Restrictions Precautions Precautions: Fall    Mobility  Bed Mobility Overal bed mobility: Needs Assistance Bed Mobility: Supine to Sit;Sit to Supine     Supine to sit: Max assist;HOB elevated Sit to supine: +2 for physical assistance;Total assist   General bed mobility comments: Two person max assist to come to EOB with HOB maximally elevated, difficulty getting legs over the side as pt kept attempting to put them back up in the bed, difficulty returning to supine (needed two person total assist) due to resistance to return to bed and difficulty understanding what is being asked of him despite multimodal cues.   Transfers Overall transfer level: Needs assistance               General transfer comment: Attempted rocking forward to stand, counting "1, 2, 3..." and despite manual facilitation to come forward and up onto his feet, pt unable to intiate motion to stand.  He was able  to stand with me last session with mod assist.          Balance Overall balance assessment: Needs assistance Sitting-balance support: Feet supported;Bilateral upper extremity supported Sitting balance-Leahy Scale: Poor Sitting balance - Comments: needs min to mod assist at trunk to maintain sitting EOB.  One hand supported by PT and one hand on foot board.  Postural control: Left lateral lean(this may be due to mattress)                                  Cognition Arousal/Alertness: Awake/alert Behavior During Therapy: Restless;Flat affect(fidgety) Overall Cognitive Status: Impaired/Different from baseline Area of Impairment: Orientation;Attention;Memory;Following commands;Awareness;Safety/judgement;Problem solving                 Orientation Level: Disoriented to;Person;Place;Time;Situation Current Attention Level: Focused Memory: Decreased recall of precautions;Decreased short-term memory Following Commands: (I could not get him to follow any commands today) Safety/Judgement: Decreased awareness of safety;Decreased awareness of deficits Awareness: Intellectual Problem Solving: Slow processing;Decreased initiation;Requires verbal cues;Difficulty sequencing;Requires tactile cues General Comments: Pt restless, mumbling, unable to tell me his name today, not following commands for me, but alert and awake.          General Comments General comments (skin integrity, edema, etc.): VSS on RA throuthout. Pt seemed to like music played during session, hummed along.      Pertinent Vitals/Pain Pain Assessment: Faces Faces Pain Scale: No hurt  PT Goals (current goals can now be found in the care plan section) Acute Rehab PT Goals Patient Stated Goal: unable to state Progress towards PT goals: Not progressing toward goals - comment(a bit of a backslide today, not sure why.)    Frequency    Min 2X/week      PT Plan Current plan remains  appropriate       AM-PAC PT "6 Clicks" Mobility   Outcome Measure  Help needed turning from your back to your side while in a flat bed without using bedrails?: Total Help needed moving from lying on your back to sitting on the side of a flat bed without using bedrails?: Total Help needed moving to and from a bed to a chair (including a wheelchair)?: Total Help needed standing up from a chair using your arms (e.g., wheelchair or bedside chair)?: Total Help needed to walk in hospital room?: Total Help needed climbing 3-5 steps with a railing? : Total 6 Click Score: 6    End of Session   Activity Tolerance: Patient tolerated treatment well Patient left: in bed;with call bell/phone within reach;with bed alarm set Nurse Communication: Mobility status PT Visit Diagnosis: Muscle weakness (generalized) (M62.81);Difficulty in walking, not elsewhere classified (R26.2);Other symptoms and signs involving the nervous system [id number])     Time: [id number] PT Time Calculation (min) (ACUTE ONLY): 33 min  Charges:  $Therapeutic Activity: 23-37 mins                     05/28/2019, 3:22 PM

## 2019-05-28 NOTE — Progress Notes (Signed)
SLP Cancellation Note  Patient Details Name: Ricky Ward MRN: [id number] DOB: Oct 19, 1950   Cancelled treatment:       Reason Eval/Treat Not Completed: Patient's level of consciousness. RN concerned pt may be aspirating given upper airway sounds. SLP visited with pt who is minimally alert,  has an audible upper airway gurgle, mumbles with wet vocal quality, doesn't gag with deep suction to clear pharynx, doesnt respond appropriately to ice touched to lips. Full eval deferred given insufficient arousal for PO trials and overt signs of aspiration of secretions. Recommend NPO. SLP will f/u tomorrow.   Herbie Baltimore, MA CCC-SLP  Acute Rehabilitation Services Pager [phone] Office [phone]  Lynann Beaver 05/28/2019, 1:35 PM

## 2019-05-29 ENCOUNTER — Inpatient Hospital Stay (HOSPITAL_COMMUNITY): Payer: Medicare Other

## 2019-05-29 LAB — CBC
HCT: 39.2 % (ref 39.0–52.0)
Hemoglobin: 13.1 g/dL (ref 13.0–17.0)
MCH: 32 pg (ref 26.0–34.0)
MCHC: 33.4 g/dL (ref 30.0–36.0)
MCV: 95.8 fL (ref 80.0–100.0)
Platelets: 274 10*3/uL (ref 150–400)
RBC: 4.09 MIL/uL — ABNORMAL LOW (ref 4.22–5.81)
RDW: 13.2 % (ref 11.5–15.5)
WBC: 8 10*3/uL (ref 4.0–10.5)
nRBC: 0 % (ref 0.0–0.2)

## 2019-05-29 MED ORDER — DM-GUAIFENESIN ER 30-600 MG PO TB12
1.0000 | ORAL_TABLET | Freq: Two times a day (BID) | ORAL | Status: DC
  Administered 2019-05-30 – 2019-06-08 (×18): 1 via ORAL
  Filled 2019-05-29 (×22): qty 1

## 2019-05-29 MED ORDER — DIVALPROEX SODIUM 125 MG PO CSDR
250.0000 mg | DELAYED_RELEASE_CAPSULE | Freq: Every day | ORAL | Status: DC
  Administered 2019-05-29 – 2019-06-07 (×10): 250 mg via ORAL
  Filled 2019-05-29 (×11): qty 2

## 2019-05-29 MED ORDER — ENSURE ENLIVE PO LIQD
237.0000 mL | Freq: Three times a day (TID) | ORAL | Status: DC
  Administered 2019-05-30 – 2019-06-08 (×22): 237 mL via ORAL
  Filled 2019-05-29: qty 237

## 2019-05-29 NOTE — Procedures (Addendum)
Patient Name:Ricky Ward [id number] Epilepsy Attending:Huzaifa Viney Barbra Sarks Referring Physician/Provider:Dr. Deatra Ina Duration:05/28/2019 1047 to 05/29/2019 1015  Patient history:69 year old male status post craniotomy after left-sided SDH. EEG to evaluate for seizures.  Level of alertness:lethargic  AEDs during EEG study:Keppra, Depakote  Technical aspects: This EEG study was done with scalp electrodes positioned according to the 10-20 International system of electrode placement. Electrical activity was acquired at a sampling rate of 500Hz  and reviewed with a high frequency filter of 70Hz  and a low frequency filter of 1Hz . EEG data were recorded continuously and digitally stored.  Description:No clear posterior dominant was seen. EEG showed continuous generalized and lateralized left hemisphere 3 to 5 Hz theta-delta slowing,at times with triphasic morphology. Sharp waves werealso seen in left parieto-occipital region. Hyperventilation and photic stimulation were not performed.  Abnormality - Sharp waves, left parieto-occipital region - Continuous slow, generalized and lateralized left hemisphere   IMPRESSION: This study showed evidence of potential epileptogenicity in left parieto-occipital region. There is also cortical dysfunction in left hemisphere likely secondary to underlying SDH. Additionally, there is evidence of moderate diffuse encephalopathy, nonspecific etiology. No seizures were seen throughout the recording.  Latasha Puskas Barbra Sarks

## 2019-05-29 NOTE — Progress Notes (Signed)
Cortrak Tube Team Note:  Consult received to place a Cortrak feeding tube.   Multiple attempts made to pass Cortrak through stomach, unsuccessful due to large hiatal hernia. Patient underwent MBS this afternoon, diet was advanced to DYS 1 with nectar thick liquids. Hold off on diagnostic radiology tube placement and monitor PO intake.   Mariana Single RD, LDN Clinical Nutrition Pager listed in [place]

## 2019-05-29 NOTE — Progress Notes (Signed)
LTM EEG disconnected - no skin breakdown at unhook.  

## 2019-05-29 NOTE — Progress Notes (Signed)
Modified Barium Swallow Progress Note  Patient Details  Name: WILVER ENDRIZZI MRN: [id number] Date of Birth: 02/07/1951  Today's Date: 05/29/2019  Modified Barium Swallow completed.  Full report located under Chart Review in the Imaging Section.  Brief recommendations include the following:  Clinical Impression  Pt demonstrates a moderate dysphagia with significant pharyngeal weakness and suspected standing/coated pharyngeal secretions. At the time of assessment pt is alert but confused able to take sips and bites with moderate verbal and tactile cueing. He is easily distracted with timely oral transit, but at time delayed/absent swallow response. Thin liquids are silently aspirated (delayed cough may be indicative of aspiration), but nectar can remain pooled in pharynx without penetrating the vestibule. Alternated between large rapid bites of puree and consecutive sips of nectar to challenge pts ability. No aspiration occurred during the study, but there is high risk of aspiration when pt is poorly positioned or less alert. Recommend pt initiate a puree/nectar thick diet, though an alternate means of nutrition may be advisable when available given high risk and potential to have fluctuating arousal. Will f/u for tolerance.     Swallow Evaluation Recommendations       SLP Diet Recommendations: Dysphagia 1 (Puree) solids;Nectar thick liquid   Liquid Administration via: Straw   Medication Administration: Crushed with puree       Compensations: Slow rate;Small sips/bites;Minimize environmental distractions   Postural Changes: Seated upright at 90 degrees;Remain semi-upright after after feeds/meals (Comment)   Oral Care Recommendations: Oral care QID       Herbie Baltimore, MA CCC-SLP  Acute Rehabilitation Services Pager [phone] Office [phone]  Lynann Beaver 05/29/2019,2:11 PM

## 2019-05-29 NOTE — Progress Notes (Addendum)
  Speech Language Pathology Evaluation: Dysphagia  Patient Details Name: Ricky Ward MRN: [id number] DOB: 10-10-1950 Today's Date: 05/29/2019 Time: [id number] SLP Time Calculation (min) (ACUTE ONLY): 10 min  Assessment / Plan / Recommendation Clinical Impression  Pt more alert and aware today but confused with congested upper airway sounds. Pt cued to cough and clear with some success prior to PO trials. Pt needed max cueing to recognize cup and straw, but eventually achieve tight labial seal and took automatic straw sips. Swallow much swifter than yesterday. Pt took consecutive sips followed by hard delayed coughing. Hard to differentiate from baseline cough and potential signs of aspiration. Will proceed with MBS today, after removal of EEG sensors, to determine safety with PO. RN also has Cortrak team lined up incase alternate feeding is needed.   HPI HPI: 69 y.o. man s/p craniotomy for 3cm SDH, recovering well. Ricky Ward with good evacuation, expected pneumocephalus      SLP Plan  MBS       Recommendations  Diet recommendations: NPO                Follow up Recommendations: Skilled Nursing facility Plan: [facility] [place], [place]  Acute Rehabilitation Services Pager [phone] Office [phone]  Lynann Beaver 05/29/2019, 10:03 AM

## 2019-05-29 NOTE — Progress Notes (Signed)
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  Pt discussed during ICU rounds and with RN.  Nutrition history is unclear. Pt is not able to answer any questions. Noted pt on Marinol on admission. Pt ate very little yesterday at breakfast then made NPO due to coughing with PO's. Suspect malnutrition related to psych issues/cognitive.  During NFPE noted follicular hyperkeratosis. Pt has hx of psoriasis.  Will monitor to determine pt's ability to meet nutrition needs.   4/16 MBS, diet advanced to dysphagia 1 with nectar thickened liquids. Attempted cortrak due to poor po intake but unsuccessful due to large hiatal hernia  Medications reviewed and include: colace, marinol, miralax, senokot-s  Labs reviewed    NUTRITION - FOCUSED PHYSICAL EXAM:    Most Recent Value  Orbital Region  Mild depletion  Upper Arm Region  Mild depletion  Thoracic and Lumbar Region  No depletion  Buccal Region  Mild depletion  Temple Region  Mild depletion  Clavicle Bone Region  Moderate depletion  Clavicle and Acromion Bone Region  Moderate depletion  Scapular Bone Region  Moderate depletion  Dorsal Hand  Unable to assess  Patellar  Region  Mild depletion  Anterior Thigh Region  Mild depletion  Posterior Calf Region  Mild depletion  Edema (RD Assessment)  None  Hair  Reviewed  Eyes  Reviewed  Mouth  Reviewed  Skin  Reviewed [follicular hyperkeratosis on shoulders]  Nails  Reviewed       Diet Order:   Diet Order            DIET - DYS 1 Room service appropriate? Yes; Fluid consistency: Nectar Thick  Diet effective now              EDUCATION NEEDS:   No education needs have been identified at this time  Skin:  Skin Assessment: Reviewed RN Assessment(head incision)  Last BM:  unknown  Height:   Ht Readings from Last 1 Encounters:  05/25/19 5\' 7"  (1.702 m)    Weight:   Wt Readings from Last 1 Encounters:  05/25/19 77.6 kg    Ideal Body Weight:  67.2 kg  BMI:  Body mass index is 26.79 kg/m.  Estimated Nutritional Needs:   Kcal:  2000-2300  Protein:  100-120 grams  Fluid:  >2 L/day  Lockie Pares., RD, LDN, CNSC See AMiON for contact information

## 2019-05-29 NOTE — Progress Notes (Signed)
  NEUROSURGERY PROGRESS NOTE   CXR revealed b/l pulmonary effusions. Given lasix, IVF decreased.  Appears much more alert this am Answering questions more appropriately No concerns this am  EXAM:  BP 111/68 (BP Location: Left Arm)   Pulse 91   Temp 97.6 F (36.4 C) (Oral)   Resp 18   Ht 5\' 7"  (1.702 m)   Wt 77.6 kg   SpO2 92%   BMI 26.79 kg/m   Awake, alert Oriented to self but not location or year Speech slow, confused at times  CN grossly intact  MAEW Incision: c/d/i  IMPRESSION/PLAN 69 y.o. male s/p left crani for SDH. Improved this am compared to yesterday. - EEG without evidence of seizures. - pulmonary effusions: lasix given. Appears less congested today. Will continue to monitor. Repeat CXR tomorrow. - SLP to re-eval today. If needs to remain NPO, would rec cortrak. Added mucinex.

## 2019-05-30 ENCOUNTER — Inpatient Hospital Stay (HOSPITAL_COMMUNITY): Payer: Medicare Other

## 2019-05-30 MED ORDER — LEVOTHYROXINE SODIUM 88 MCG PO TABS
88.0000 ug | ORAL_TABLET | Freq: Every day | ORAL | Status: DC
  Administered 2019-05-31 – 2019-06-08 (×9): 88 ug via ORAL
  Filled 2019-05-30 (×9): qty 1

## 2019-05-30 NOTE — Progress Notes (Signed)
  NEUROSURGERY PROGRESS NOTE   No issues overnight. Pt has some mild SOB, reports at baseline.  EXAM:  BP 118/72   Pulse 87   Temp 97.8 F (36.6 C) (Oral)   Resp 14   Ht 5\' 7"  (1.702 m)   Wt 77.6 kg   SpO2 99%   BMI 26.79 kg/m   Awake, alert, oriented  Speech fluent, appropriate  CN grossly intact  Moving all extremities well Wound c/d/i  IMPRESSION:  69 y.o. male POD# 5 s/p left crani for SDH, appears to be neurologically stable. Respiratory status also appears stable.  PLAN: - Can transfer to stepdown with tele - Cont observation - Cont PT/OT

## 2019-05-30 NOTE — Progress Notes (Signed)
PHARMACIST - PHYSICIAN COMMUNICATION   CONCERNING: IV to Oral Route Change Policy  RECOMMENDATION: This patient is receiving synthroid by the intravenous route.  Based on criteria approved by the Pharmacy and Therapeutics Committee, the intravenous medication(s) is/are being converted to the equivalent oral dose form(s).   DESCRIPTION: These criteria include:  The patient is eating (either orally or via tube) and/or has been taking other orally administered medications for a least 24 hours  The patient has no evidence of active gastrointestinal bleeding or impaired GI absorption (gastrectomy, short bowel, patient on TNA or NPO).  If you have questions about this conversion, please contact the Pharmacy Department  []   [phone] )  [facility] []   [phone] )  [facility] [x]   [phone] )  [facility] []   [phone] )  [facility] []   [phone] )  [place], PharmD Clinical Pharmacist **Pharmacist phone directory can now be found on [email or web address] ([email or web address]).  Listed under [place].

## 2019-05-31 MED ORDER — BISACODYL 10 MG RE SUPP
10.0000 mg | Freq: Once | RECTAL | Status: AC
  Administered 2019-05-31: 12:00:00 10 mg via RECTAL

## 2019-05-31 NOTE — Anesthesia Postprocedure Evaluation (Signed)
Anesthesia Post Note  Patient: JHOSTIN DENAULT  Procedure(s) Performed: CRANIOTOMY HEMATOMA EVACUATION SUBDURAL (Left Head)     Patient location during evaluation: PACU Anesthesia Type: General Level of consciousness: awake and patient cooperative Pain management: pain level controlled Vital Signs Assessment: post-procedure vital signs reviewed and stable Respiratory status: spontaneous breathing, nonlabored ventilation, respiratory function stable and patient connected to nasal cannula oxygen Cardiovascular status: blood pressure returned to baseline and stable Postop Assessment: no apparent nausea or vomiting Anesthetic complications: no    Last Vitals:  Vitals:   05/31/19 1300 05/31/19 1400  BP: 140/75 (!) 116/105  Pulse:    Resp:    Temp:    SpO2:      Last Pain:  Vitals:   05/31/19 0800  TempSrc: Oral  PainSc: 0-No pain                 Bernell Haynie

## 2019-05-31 NOTE — Progress Notes (Signed)
  NEUROSURGERY PROGRESS NOTE   No issues overnight. No complaints this am.  EXAM:  BP 112/80 (BP Location: Left Arm)   Pulse 80   Temp 98.4 F (36.9 C) (Oral)   Resp 17   Ht 5\' 7"  (1.702 m)   Wt 77.6 kg   SpO2 100%   BMI 26.79 kg/m   Awake, alert, oriented  Speech fluent, appropriate  CN grossly intact  Moving all extremities Wound c/d/i  IMPRESSION:  68 y.o. male POD# 6 s/p left crani for SDH, neurologically stable. Respiratory status also stable.  PLAN: - transfer to stepdown with tele - Will need placement

## 2019-06-01 ENCOUNTER — Encounter: Payer: Self-pay | Admitting: *Deleted

## 2019-06-01 DIAGNOSIS — E440 Moderate protein-calorie malnutrition: Secondary | ICD-10-CM | POA: Insufficient documentation

## 2019-06-01 DIAGNOSIS — Z20828 Contact with and (suspected) exposure to other viral communicable diseases: Secondary | ICD-10-CM | POA: Diagnosis not present

## 2019-06-01 DIAGNOSIS — U071 COVID-19: Secondary | ICD-10-CM | POA: Diagnosis not present

## 2019-06-01 MED ORDER — TAMSULOSIN HCL 0.4 MG PO CAPS
0.4000 mg | ORAL_CAPSULE | Freq: Every day | ORAL | Status: DC
  Administered 2019-06-01 – 2019-06-07 (×7): 0.4 mg via ORAL
  Filled 2019-06-01 (×7): qty 1

## 2019-06-01 MED ORDER — LEVETIRACETAM 500 MG PO TABS
500.0000 mg | ORAL_TABLET | Freq: Two times a day (BID) | ORAL | Status: DC
  Administered 2019-06-02 – 2019-06-08 (×13): 500 mg via ORAL
  Filled 2019-06-01 (×13): qty 1

## 2019-06-01 NOTE — Progress Notes (Addendum)
Neurosurgery Service Progress Note  Subjective: No acute events overnight, no paroxysmal changes in exam  Objective: Vitals:   06/01/19 0700 06/01/19 0800 06/01/19 0900 06/01/19 1000  BP: 101/78 121/67 (!) 100/56 (!) 128/96  Pulse: 91 85 86 85  Resp: (!) 22 16 20 16   Temp:  98.7 F (37.1 C)    TempSrc:  Oral    SpO2: 97% 98% 95% 97%  Weight:      Height:       Temp (24hrs), Avg:98.4 F (36.9 C), Min:98.2 F (36.8 C), Max:98.7 F (37.1 C)  CBC Latest Ref Rng & Units 05/29/2019 05/28/2019 05/25/2019  WBC 4.0 - 10.5 K/uL 8.0 9.6 9.4  Hemoglobin 13.0 - 17.0 g/dL 13.1 13.0 14.6  Hematocrit 39.0 - 52.0 % 39.2 38.5(L) 44.5  Platelets 150 - 400 K/uL 274 241 280   BMP Latest Ref Rng & Units 05/28/2019 05/25/2019 04/24/2019  Glucose 70 - 99 mg/dL 127(H) 162(H) 111(H)  BUN 8 - 23 mg/dL 16 16 12   Creatinine 0.61 - 1.24 mg/dL 1.19 1.15 1.09  BUN/Creat Ratio 6 - 22 (calc) - - -  Sodium 135 - 145 mmol/L 136 142 138  Potassium 3.5 - 5.1 mmol/L 3.7 4.1 4.0  Chloride 98 - 111 mmol/L 98 102 103  CO2 22 - 32 mmol/L 27 28 23   Calcium 8.9 - 10.3 mg/dL 9.0 9.4 8.9    Intake/Output Summary (Last 24 hours) at 06/01/2019 1109 Last data filed at 05/31/2019 1900 Gross per 24 hour  Intake 0 ml  Output --  Net 0 ml    Current Facility-Administered Medications:  .  0.9 %  sodium chloride infusion, , Intravenous, Continuous, Costella, Vista Mink, PA-C, Stopped at 05/31/19 1009 .  acetaminophen (TYLENOL) tablet 650 mg, 650 mg, Oral, Q4H PRN, 650 mg at 05/30/19 1002 **OR** acetaminophen (TYLENOL) suppository 650 mg, 650 mg, Rectal, Q4H PRN, Judith Part, MD .  atorvastatin (LIPITOR) tablet 40 mg, 40 mg, Oral, q1800, Judith Part, MD, 40 mg at 05/31/19 1739 .  chlorhexidine (PERIDEX) 0.12 % solution 15 mL, 15 mL, Mouth Rinse, BID, Costella, Vincent J, PA-C, 15 mL at 06/01/19 1027 .  Chlorhexidine Gluconate Cloth 2 % PADS 6 each, 6 each, Topical, Q0600, Judith Part, MD, 6 each at  06/01/19 1026 .  clotrimazole (LOTRIMIN) 1 % cream, , Topical, BID PRN, Judith Part, MD .  dextromethorphan-guaiFENesin (Minerva DM) 30-600 MG per 12 hr tablet 1 tablet, 1 tablet, Oral, BID, Costella, Vista Mink, PA-C, 1 tablet at 06/01/19 1032 .  divalproex (DEPAKOTE SPRINKLE) capsule 250 mg, 250 mg, Oral, QHS, Jackey Housey, Joyice Faster, MD, 250 mg at 05/31/19 2138 .  docusate sodium (COLACE) capsule 100 mg, 100 mg, Oral, BID, Judith Part, MD, 100 mg at 05/31/19 2146 .  dronabinol (MARINOL) capsule 2.5 mg, 2.5 mg, Oral, BID AC, Judith Part, MD, 2.5 mg at 06/01/19 0817 .  famotidine (PEPCID) tablet 20 mg, 20 mg, Oral, Daily, Shihab States A, MD, 20 mg at 06/01/19 1032 .  feeding supplement (ENSURE ENLIVE) (ENSURE ENLIVE) liquid 237 mL, 237 mL, Oral, TID BM, Serafin Decatur A, MD, 237 mL at 05/31/19 2015 .  furosemide (LASIX) injection 20 mg, 20 mg, Intravenous, Once, Costella, [employer], PA-C .  heparin injection 5,000 Units, 5,000 Units, Subcutaneous, Q8H, Judith Part, MD, 5,000 Units at 06/01/19 0519 .  HYDROcodone-acetaminophen (NORCO/VICODIN) 5-325 MG per tablet 1 tablet, 1 tablet, Oral, Q4H PRN, Judith Part, MD .  hydrocortisone cream  1 % 1 application, 1 application, Topical, Daily, Judith Part, MD, 1 application at [id number] .  HYDROmorphone (DILAUDID) injection 0.5 mg, 0.5 mg, Intravenous, Q3H PRN, Judith Part, MD, 0.5 mg at 05/27/19 1814 .  ketoconazole (NIZORAL) 2 % cream 1 application, 1 application, Topical, Daily, Batya Citron, Joyice Faster, MD, 1 application at [id number] .  labetalol (NORMODYNE) injection 10-40 mg, 10-40 mg, Intravenous, Q10 min PRN, Judith Part, MD, 20 mg at 05/25/19 1659 .  [START ON 06/02/2019] levETIRAcetam (KEPPRA) tablet 500 mg, 500 mg, Oral, BID, Meyran, Ocie Cornfield, NP .  levothyroxine (SYNTHROID) tablet 88 mcg, 88 mcg, Oral, Q0600, Kris Mouton, RPH, 88 mcg at 06/01/19 0519 .  MEDLINE mouth  rinse, 15 mL, Mouth Rinse, q12n4p, Costella, Vincent J, PA-C, 15 mL at 05/31/19 1644 .  ondansetron (ZOFRAN) tablet 4 mg, 4 mg, Oral, Q4H PRN **OR** ondansetron (ZOFRAN) injection 4 mg, 4 mg, Intravenous, Q4H PRN, Minela Bridgewater A, MD .  PARoxetine (PAXIL) tablet 20 mg, 20 mg, Oral, Daily, Karin Griffith A, MD, 20 mg at 06/01/19 1032 .  polyethylene glycol (MIRALAX / GLYCOLAX) packet 17 g, 17 g, Oral, Daily PRN, Judith Part, MD, 17 g at 05/30/19 1002 .  promethazine (PHENERGAN) tablet 12.5-25 mg, 12.5-25 mg, Oral, Q4H PRN, Judith Part, MD .  QUEtiapine (SEROQUEL) tablet 400 mg, 400 mg, Oral, QHS, Pascale Maves, Joyice Faster, MD, 400 mg at 05/31/19 2138 .  senna-docusate (Senokot-S) tablet 1 tablet, 1 tablet, Oral, Daily PRN, Judith Part, MD   Physical Exam: Awake/alert, oriented to person and year, but not place, PERRL with baseline XT, FCx4 with full strength Incision c/d/i  Assessment & Plan: 69 y.o. man s/p craniotomy for 3cm SDH, recovering well. CTH with good evacuation, expected pneumocephalus. Confusion POD2, rpt CTH without significant rehemorrhage, overnight EEG with some L sided sharps but no PLEDs/Sz. CXR w/ b/l effusions s/p lasix w/ improvement in CXR and mental status.  -transfer to floor -continue weaning O2 as tolerated, no further diuresis at this time -cont home Rx including valproate, levetiracetam -diet dysphagia puree nectar thick -d/c IVF -PT/OT rec SNF, will require placement -SCDs/TEDs/SQH  Joyice Faster Roselin Wiemann  06/01/19 11:09 AM

## 2019-06-01 NOTE — Progress Notes (Signed)
  Speech Language Pathology Treatment: Dysphagia  Patient Details Name: Ricky Ward MRN: [id number] DOB: 16-Aug-1950 Today's Date: 06/01/2019 Time: 1100-1117 SLP Time Calculation (min) (ACUTE ONLY): 17 min  Assessment / Plan / Recommendation Clinical Impression  Pt was awake and alert during the session, but confused (as seen in previous session). Pt was offered NTL and purees. Pt required consistent repositioning throughout the session to maintain an upright position. He was noted with multiple swallows, delayed coughing throughout and wet vocal quality, concerning for aspiration, although aspiration was mostly silent during MBS (4/16). As he took smaller bites and sips, delayed coughing was reduced. Recommend continuing with current diet, as long as the pt is awake/alert and positioned fully upright. Will continue to follow. Patient may also benefit from a speech-language evaluation; please order if agree.   HPI HPI: 69 y.o. man s/p craniotomy for 3cm SDH, recovering well. CTH with good evacuation, expected pneumocephalus      SLP Plan  Continue with current plan of care       Recommendations  Diet recommendations: Dysphagia 1 (puree);Nectar-thick liquid Liquids provided via: Cup;Straw Medication Administration: Crushed with puree Supervision: Full supervision/cueing for compensatory strategies Compensations: Slow rate;Small sips/bites;Minimize environmental distractions Postural Changes and/or Swallow Maneuvers: Seated upright 90 degrees                Oral Care Recommendations: Oral care BID Follow up Recommendations: Skilled Nursing facility SLP Visit Diagnosis: Dysphagia, oropharyngeal phase (R13.12) Plan: Continue with current plan of care       GO               Aline August, Student SLP Office: [phone]  06/01/2019, 12:19 PM

## 2019-06-01 NOTE — Progress Notes (Signed)
Spoke with patient's nurse. At this time not infusion orders. Instructed nurse to notify [facility] when orders placed and as needed. VU. Fran Lowes, RN [facility]

## 2019-06-01 NOTE — Progress Notes (Signed)
Received from ICU.  Oriented to room and surroundings.

## 2019-06-02 NOTE — Progress Notes (Signed)
Physical Therapy Treatment Patient Details Name: Ricky Ward MRN: [id number] DOB: 1950/07/27 Today's Date: 06/02/2019    History of Present Illness Pt is 69 yo male who presented with L sided weakness after a fall.  Pt found to have large L hemispheric subdural hematoma with rightward midline shift of 7 mm, L finger laceration, and small acute R frontal MCA infarcts.Pt s/p craniotomy hematoma evacuation on 05/25/19.  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  Pt with PMH including bipolar, hypothyroidism, hyperlipidemia.    PT Comments    Patient is awake/alert and making progress toward PT goals this session. Pt presents with impaired cognition, mobility, and vision. Pt appears to have R visual field cut and with R side inattention. Pt follows simple commands with increased time and requires cues to attend to task given he is easily distracted by environment. +2 assist required for short distance gait training. Given pt's current mobility level recommend CIR for further skilled PT services to maximize independence and safety with mobility.    Follow Up Recommendations  CIR     Equipment Recommendations  Other (comment)(TBD post acute )    Recommendations for Other Services       Precautions / Restrictions Precautions Precautions: Fall Restrictions Weight Bearing Restrictions: No    Mobility  Bed Mobility Overal bed mobility: Needs Assistance Bed Mobility: Supine to Sit     Supine to sit: HOB elevated;Min assist     General bed mobility comments: assist to scoot hips; multimodal cues for sequencing; increased time needed and redirection   Transfers Overall transfer level: Needs assistance Equipment used: Rolling walker (2 wheeled)(bar of Stedy standing frame (turned backwards)) Transfers: Sit to/from Stand Sit to Stand: Mod assist;+2 safety/equipment         General transfer comment: stood X 2 from EOB and then X 1 from recliner; cues for hand placement;  assist to power up into standing and then to maintain balance due to posterior bias; multimodal cues for upright posture and attempting to shift weight anteriorly   Ambulation/Gait Ambulation/Gait assistance: Mod assist;+2 physical assistance;+2 safety/equipment;Max assist Gait Distance (Feet): (~8 ft total ) Assistive device: Rolling walker (2 wheeled) Gait Pattern/deviations: Shuffle;Trunk flexed;Step-to pattern Gait velocity: decreased   General Gait Details: heavy posterior bias; multimodal cues for upright posture; vc for increased step lengths; pt with flexed bilat LE and trunk and unable to increased step lengths; OT in front of pt for a few feet to redirect gaze as pt has L gaze preference    Stairs             Wheelchair Mobility    Modified Rankin (Stroke Patients Only)       Balance Overall balance assessment: Needs assistance Sitting-balance support: Feet supported;Bilateral upper extremity supported Sitting balance-Leahy Scale: Fair   Postural control: Posterior lean Standing balance support: Bilateral upper extremity supported Standing balance-Leahy Scale: Poor                              Cognition Arousal/Alertness: Awake/alert Behavior During Therapy: WFL for tasks assessed/performed Overall Cognitive Status: Impaired/Different from baseline Area of Impairment: Attention;Memory;Following commands;Safety/judgement;Problem solving                 Orientation Level: (states he is in the hospital) Current Attention Level: Sustained Memory: Decreased short-term memory Following Commands: Follows one step commands inconsistently;Follows one step commands with increased time Safety/Judgement: Decreased awareness of safety;Decreased awareness  of deficits   Problem Solving: Requires verbal cues;Difficulty sequencing;Requires tactile cues General Comments: pt is easily distracted and needs cues to redirect to tasks; echolalia at times       Exercises      General Comments General comments (skin integrity, edema, etc.): appears to have R visual field cut; able to feed himself if food in visual field (slightly to left)       Pertinent Vitals/Pain Pain Assessment: Faces Faces Pain Scale: Hurts little more Pain Location: L hand with weight bearing Pain Descriptors / Indicators: Guarding;Grimacing;Sore Pain Intervention(s): Monitored during session    Home Living                      Prior Function            PT Goals (current goals can now be found in the care plan section) Progress towards PT goals: Progressing toward goals    Frequency    Min 4X/week      PT Plan Discharge plan needs to be updated;Frequency needs to be updated    Co-evaluation PT/OT/SLP Co-Evaluation/Treatment: Yes Reason for Co-Treatment: Complexity of the patient's impairments (multi-system involvement);For patient/therapist safety;Necessary to address cognition/behavior during functional activity;To address functional/ADL transfers PT goals addressed during session: Mobility/safety with mobility;Balance        AM-PAC PT "6 Clicks" Mobility   Outcome Measure  Help needed turning from your back to your side while in a flat bed without using bedrails?: A Little Help needed moving from lying on your back to sitting on the side of a flat bed without using bedrails?: A Little Help needed moving to and from a bed to a chair (including a wheelchair)?: A Lot Help needed standing up from a chair using your arms (e.g., wheelchair or bedside chair)?: A Lot Help needed to walk in hospital room?: A Lot Help needed climbing 3-5 steps with a railing? : Total 6 Click Score: 13    End of Session Equipment Utilized During Treatment: Gait belt Activity Tolerance: Patient tolerated treatment well Patient left: with call bell/phone within reach;in chair;with chair alarm set Nurse Communication: Mobility status PT Visit Diagnosis: Muscle  weakness (generalized) (M62.81);Difficulty in walking, not elsewhere classified (R26.2);Other symptoms and signs involving the nervous system (R29.898)     Time: [id number] PT Time Calculation (min) (ACUTE ONLY): 52 min  Charges:  $Gait Training: 8-22 mins                     Earney Navy, PTA Acute Rehabilitation Services Pager: [phone] Office: [phone]     Darliss Cheney 06/02/2019, 2:18 PM

## 2019-06-02 NOTE — Progress Notes (Signed)
Neurosurgery Service Progress Note  Subjective: No acute events overnight, globally continuing to slowly improve, no complaints today  Objective: Vitals:   06/01/19 2100 06/02/19 0000 06/02/19 0400 06/02/19 0821  BP: 124/66 105/64 108/63 102/84  Pulse: 86 69 69 69  Resp: 18 16  18   Temp: 99.6 F (37.6 C) 98.7 F (37.1 C) 98.6 F (37 C) 98 F (36.7 C)  TempSrc: Oral Axillary Axillary Oral  SpO2: 95% 95% 95% 93%  Weight:      Height:       Temp (24hrs), Avg:98.7 F (37.1 C), Min:98 F (36.7 C), Max:99.6 F (37.6 C)  CBC Latest Ref Rng & Units 05/29/2019 05/28/2019 05/25/2019  WBC 4.0 - 10.5 K/uL 8.0 9.6 9.4  Hemoglobin 13.0 - 17.0 g/dL 13.1 13.0 14.6  Hematocrit 39.0 - 52.0 % 39.2 38.5(L) 44.5  Platelets 150 - 400 K/uL 274 241 280   BMP Latest Ref Rng & Units 05/28/2019 05/25/2019 04/24/2019  Glucose 70 - 99 mg/dL 127(H) 162(H) 111(H)  BUN 8 - 23 mg/dL 16 16 12   Creatinine 0.61 - 1.24 mg/dL 1.19 1.15 1.09  BUN/Creat Ratio 6 - 22 (calc) - - -  Sodium 135 - 145 mmol/L 136 142 138  Potassium 3.5 - 5.1 mmol/L 3.7 4.1 4.0  Chloride 98 - 111 mmol/L 98 102 103  CO2 22 - 32 mmol/L 27 28 23   Calcium 8.9 - 10.3 mg/dL 9.0 9.4 8.9    Intake/Output Summary (Last 24 hours) at 06/02/2019 1114 Last data filed at 06/02/2019 0858 Gross per 24 hour  Intake 120 ml  Output --  Net 120 ml    Current Facility-Administered Medications:  .  acetaminophen (TYLENOL) tablet 650 mg, 650 mg, Oral, Q4H PRN, 650 mg at 05/30/19 1002 **OR** acetaminophen (TYLENOL) suppository 650 mg, 650 mg, Rectal, Q4H PRN, Consuella Lose, MD .  atorvastatin (LIPITOR) tablet 40 mg, 40 mg, Oral, q1800, Consuella Lose, MD, 40 mg at 06/01/19 1800 .  chlorhexidine (PERIDEX) 0.12 % solution 15 mL, 15 mL, Mouth Rinse, BID, Consuella Lose, MD, 15 mL at 06/02/19 1054 .  clotrimazole (LOTRIMIN) 1 % cream, , Topical, BID PRN, Consuella Lose, MD .  dextromethorphan-guaiFENesin (MUCINEX DM) 30-600 MG per 12 hr  tablet 1 tablet, 1 tablet, Oral, BID, Consuella Lose, MD, 1 tablet at 06/02/19 1053 .  divalproex (DEPAKOTE SPRINKLE) capsule 250 mg, 250 mg, Oral, QHS, Consuella Lose, MD, 250 mg at 06/01/19 2220 .  docusate sodium (COLACE) capsule 100 mg, 100 mg, Oral, BID, Consuella Lose, MD, 100 mg at 06/02/19 1053 .  dronabinol (MARINOL) capsule 2.5 mg, 2.5 mg, Oral, BID AC, Consuella Lose, MD, 2.5 mg at 06/02/19 0800 .  famotidine (PEPCID) tablet 20 mg, 20 mg, Oral, Daily, Consuella Lose, MD, 20 mg at 06/02/19 1053 .  feeding supplement (ENSURE ENLIVE) (ENSURE ENLIVE) liquid 237 mL, 237 mL, Oral, TID BM, Consuella Lose, MD, 237 mL at 06/02/19 1000 .  heparin injection 5,000 Units, 5,000 Units, Subcutaneous, Q8H, Consuella Lose, MD, 5,000 Units at 06/02/19 0522 .  HYDROcodone-acetaminophen (NORCO/VICODIN) 5-325 MG per tablet 1 tablet, 1 tablet, Oral, Q4H PRN, Consuella Lose, MD .  hydrocortisone cream 1 % 1 application, 1 application, Topical, Daily, Consuella Lose, MD, 1 application at 123XX123 1100 .  HYDROmorphone (DILAUDID) injection 0.5 mg, 0.5 mg, Intravenous, Q3H PRN, Consuella Lose, MD, 0.5 mg at 05/27/19 1814 .  ketoconazole (NIZORAL) 2 % cream 1 application, 1 application, Topical, Daily, Consuella Lose, MD, 1 application at 123XX123 1100 .  labetalol (  NORMODYNE) injection 10-40 mg, 10-40 mg, Intravenous, Q10 min PRN, Consuella Lose, MD, 20 mg at 05/25/19 1659 .  levETIRAcetam (KEPPRA) tablet 500 mg, 500 mg, Oral, BID, Meyran, Ocie Cornfield, NP, 500 mg at 06/02/19 1053 .  levothyroxine (SYNTHROID) tablet 88 mcg, 88 mcg, Oral, Q0600, Consuella Lose, MD, 88 mcg at 06/02/19 0522 .  MEDLINE mouth rinse, 15 mL, Mouth Rinse, q12n4p, Consuella Lose, MD, 15 mL at 06/02/19 1104 .  ondansetron (ZOFRAN) tablet 4 mg, 4 mg, Oral, Q4H PRN **OR** ondansetron (ZOFRAN) injection 4 mg, 4 mg, Intravenous, Q4H PRN, Consuella Lose, MD .  PARoxetine (PAXIL)  tablet 20 mg, 20 mg, Oral, Daily, Consuella Lose, MD, 20 mg at 06/02/19 1053 .  polyethylene glycol (MIRALAX / GLYCOLAX) packet 17 g, 17 g, Oral, Daily PRN, Consuella Lose, MD, 17 g at 05/30/19 1002 .  promethazine (PHENERGAN) tablet 12.5-25 mg, 12.5-25 mg, Oral, Q4H PRN, Consuella Lose, MD .  QUEtiapine (SEROQUEL) tablet 400 mg, 400 mg, Oral, QHS, Consuella Lose, MD, 400 mg at 06/01/19 2221 .  senna-docusate (Senokot-S) tablet 1 tablet, 1 tablet, Oral, Daily PRN, Consuella Lose, MD .  tamsulosin (FLOMAX) capsule 0.4 mg, 0.4 mg, Oral, QHS, Consuella Lose, MD, 0.4 mg at 06/01/19 2221   Physical Exam: Awake/alert, Ox1, interactive, appropriate answers to questions, PERRL with baseline XT, FCx4 with full strength Incision c/d/i  Assessment & Plan: 69 y.o. man s/p craniotomy for 3cm SDH, recovering well. CTH with good evacuation, expected pneumocephalus. Confusion POD2, rpt CTH without significant rehemorrhage, overnight EEG with some L sided sharps but no PLEDs/Sz. CXR w/ b/l effusions s/p lasix w/ improvement in CXR and mental status.  -transfer to floor -cont home Rx including valproate, levetiracetam -diet dysphagia puree nectar thick -PT/OT rec SNF, SNF placement pending -SCDs/TEDs/SQH  Judith Part  06/02/19 11:14 AM

## 2019-06-02 NOTE — NC FL2 (Signed)
[facility] LEVEL OF CARE SCREENING TOOL     IDENTIFICATION  Patient Name: Ricky Ward Birthdate: 1951/01/28 Sex: male Admission Date (Current Location): 05/25/2019  [facility] and [place] Number:  Herbalist and Address:  [facility] [place], [place] [address], [place], [place] [address]      Provider Number: [id number]  Attending Physician Name and Address:  Judith Part, MD  Relative Name and Phone Number:  Pete Glatter [phone]    Current Level of Care: Hospital Recommended Level of Care: [place] Prior Approval Number:    Date Approved/Denied:   PASRR Number:    Discharge Plan: SNF    Current Diagnoses: Patient Active Problem List   Diagnosis Date Noted  . Malnutrition of moderate degree 06/01/2019  . Subdural hematoma ([place]) 05/25/2019  . Acute ischemic stroke ([place]) 04/24/2019  . Subdural hemorrhage ([place]) 04/22/2019  . BPH (benign prostatic hyperplasia) 04/07/2019  . Urinary retention due to benign prostatic hyperplasia 04/07/2019  . Bipolar 1 disorder ([place])   . Aspiration pneumonia ([place]) 04/04/2019  . Acute respiratory failure with hypoxia ([place]) 04/04/2019  . Dysphagia 04/04/2019  . Hypothyroidism 04/04/2019  . HLD (hyperlipidemia) 04/04/2019  . Heartburn 03/12/2019  . Preventative health care 04/28/2018  . Alcohol use disorder, mild, in sustained remission 10/09/2017    Orientation RESPIRATION BLADDER Height & Weight     Self, Time  Normal External catheter Weight: 171 lb 1.2 oz (77.6 kg) Height:  5\' 7"  (170.2 cm)  BEHAVIORAL SYMPTOMS/MOOD NEUROLOGICAL BOWEL NUTRITION STATUS      Incontinent Diet(See discharge summary)  AMBULATORY STATUS COMMUNICATION OF NEEDS Skin   Extensive Assist Verbally Surgical wounds(Surgical incision Right Upper Head, Thin film)                       Personal Care Assistance Level of Assistance  Bathing, Feeding, Dressing Bathing Assistance: Maximum  assistance Feeding assistance: Limited assistance Dressing Assistance: Maximum assistance     Functional Limitations Info  Sight, Hearing, Speech Sight Info: Adequate Hearing Info: Adequate Speech Info: Adequate    SPECIAL CARE FACTORS FREQUENCY  PT (By licensed PT), OT (By licensed OT), Speech therapy     PT Frequency: 5x week OT Frequency: 5x week     Speech Therapy Frequency: 5x week      Contractures Contractures Info: Not present    Additional Factors Info  Code Status, Allergies Code Status Info: Full Allergies Info: Penicillins Psychotropic Info: Depakote 250mg  at bedtime, Seroquel 400 mg at bedtime, Keppra 500 mg 2x daily         Current Medications (06/02/2019):  This is the current hospital active medication list Current Facility-Administered Medications  Medication Dose Route Frequency Provider Last Rate Last Admin  . acetaminophen (TYLENOL) tablet 650 mg  650 mg Oral Q4H PRN Consuella Lose, MD   650 mg at 05/30/19 1002   Or  . acetaminophen (TYLENOL) suppository 650 mg  650 mg Rectal Q4H PRN Consuella Lose, MD      . atorvastatin (LIPITOR) tablet 40 mg  40 mg Oral q1800 Consuella Lose, MD   40 mg at 06/01/19 1800  . chlorhexidine (PERIDEX) 0.12 % solution 15 mL  15 mL Mouth Rinse BID Consuella Lose, MD   15 mL at 06/02/19 1054  . clotrimazole (LOTRIMIN) 1 % cream   Topical BID PRN Consuella Lose, MD      . dextromethorphan-guaiFENesin (MUCINEX DM) 30-600 MG per 12 hr tablet  1 tablet  1 tablet Oral BID Consuella Lose, MD   1 tablet at 06/02/19 1053  . divalproex (DEPAKOTE SPRINKLE) capsule 250 mg  250 mg Oral QHS Consuella Lose, MD   250 mg at 06/01/19 2220  . docusate sodium (COLACE) capsule 100 mg  100 mg Oral BID Consuella Lose, MD   100 mg at 06/02/19 1053  . dronabinol (MARINOL) capsule 2.5 mg  2.5 mg Oral BID AC Consuella Lose, MD   2.5 mg at 06/02/19 0800  . famotidine (PEPCID) tablet 20 mg  20 mg Oral Daily Consuella Lose, MD   20 mg at 06/02/19 1053  . feeding supplement (ENSURE ENLIVE) (ENSURE ENLIVE) liquid 237 mL  237 mL Oral TID BM Consuella Lose, MD   237 mL at 06/02/19 1000  . heparin injection 5,000 Units  5,000 Units Subcutaneous Q8H Consuella Lose, MD   5,000 Units at 06/02/19 0522  . HYDROcodone-acetaminophen (NORCO/VICODIN) 5-325 MG per tablet 1 tablet  1 tablet Oral Q4H PRN Consuella Lose, MD      . hydrocortisone cream 1 % 1 application  1 application Topical Daily Consuella Lose, MD   1 application at [id number] 1100  . HYDROmorphone (DILAUDID) injection 0.5 mg  0.5 mg Intravenous Q3H PRN Consuella Lose, MD   0.5 mg at 05/27/19 1814  . ketoconazole (NIZORAL) 2 % cream 1 application  1 application Topical Daily Consuella Lose, MD   1 application at [id number] 1100  . labetalol (NORMODYNE) injection 10-40 mg  10-40 mg Intravenous Q10 min PRN Consuella Lose, MD   20 mg at 05/25/19 1659  . levETIRAcetam (KEPPRA) tablet 500 mg  500 mg Oral BID Meyran, Ocie Cornfield, NP   500 mg at 06/02/19 1053  . levothyroxine (SYNTHROID) tablet 88 mcg  88 mcg Oral Q0600 Consuella Lose, MD   88 mcg at 06/02/19 0522  . MEDLINE mouth rinse  15 mL Mouth Rinse q12n4p Consuella Lose, MD   15 mL at 06/02/19 1104  . ondansetron (ZOFRAN) tablet 4 mg  4 mg Oral Q4H PRN Consuella Lose, MD       Or  . ondansetron (ZOFRAN) injection 4 mg  4 mg Intravenous Q4H PRN Consuella Lose, MD      . PARoxetine (PAXIL) tablet 20 mg  20 mg Oral Daily Consuella Lose, MD   20 mg at 06/02/19 1053  . polyethylene glycol (MIRALAX / GLYCOLAX) packet 17 g  17 g Oral Daily PRN Consuella Lose, MD   17 g at 05/30/19 1002  . promethazine (PHENERGAN) tablet 12.5-25 mg  12.5-25 mg Oral Q4H PRN Consuella Lose, MD      . QUEtiapine (SEROQUEL) tablet 400 mg  400 mg Oral QHS Consuella Lose, MD   400 mg at 06/01/19 2221  . senna-docusate (Senokot-S) tablet 1 tablet  1 tablet Oral Daily PRN  Consuella Lose, MD      . tamsulosin (FLOMAX) capsule 0.4 mg  0.4 mg Oral QHS Consuella Lose, MD   0.4 mg at 06/01/19 2221     Discharge Medications: Please see discharge summary for a list of discharge medications.  Relevant Imaging Results:  Relevant Lab Results:   Additional Information SS# [id number]  Kirstie Peri, Student-Social Work

## 2019-06-02 NOTE — TOC Initial Note (Signed)
Transition of Care [facility]) - Initial/Assessment Note    Patient Details  Name: Ricky Ward MRN: [id number] Date of Birth: 01/14/1951  Transition [facility]) CM/SW Contact:    Kirstie Peri, [place] Work Phone Number: 06/02/2019, 12:55 PM  Clinical Narrative:                 MSW Intern called and spoke to pt's siter, janie. She is agreeable to SNF and said she did not have a placement in mind. She asked if SW would contact his ALF, [facility], where he has been for the last four years. MSW Intern called and spoke with Sunday Spillers. She said they would determine if they could take him back when he gets further into rehab. FL2 and fax out completed.  Expected Discharge Plan: Skilled Nursing Facility Barriers to Discharge: Continued Medical Work up   Patient Goals and CMS Choice Patient states their goals for this hospitalization and ongoing recovery are:: Patient unable to participate in goal planning due to Altered Mental Status. CMS Medicare.gov Compare Post Acute Care list provided to:: Patient Represenative (must comment)(Janie, sister) Choice offered to / list presented to : Sibling  Expected Discharge Plan and Services Expected Discharge Plan: [place] In-house Referral: Clinical Social Work Discharge Planning Services: CM Consult Post Acute Care Choice: [place] arrangements for the past 2 months: [place]                                      Prior Living Arrangements/Services Living arrangements for the past 2 months: [place] Lives with:: Facility Resident Patient language and need for interpreter reviewed:: Yes Do you feel safe going back to the place where you live?: Yes      Need for Family Participation in Patient Care: Yes (Comment) Care giver support system in place?: Yes (comment)   Criminal Activity/Legal Involvement Pertinent to Current Situation/Hospitalization: No - Comment as  needed  Activities of Daily Living Home Assistive Devices/Equipment: None ADL Screening (condition at time of admission) Patient's cognitive ability adequate to safely complete daily activities?: No Is the patient deaf or have difficulty hearing?: Yes Does the patient have difficulty seeing, even when wearing glasses/contacts?: Yes Does the patient have difficulty concentrating, remembering, or making decisions?: Yes Patient able to express need for assistance with ADLs?: No Does the patient have difficulty dressing or bathing?: Yes Independently performs ADLs?: No Communication: Needs assistance Is this a change from baseline?: Change from baseline, expected to last >3 days Dressing (OT): Needs assistance Is this a change from baseline?: Change from baseline, expected to last >3 days Grooming: Dependent Is this a change from baseline?: Change from baseline, expected to last >3 days Feeding: Dependent Is this a change from baseline?: Change from baseline, expected to last >3 days Bathing: Dependent Is this a change from baseline?: Change from baseline, expected to last >3 days Toileting: Dependent Is this a change from baseline?: Change from baseline, expected to last >3days In/Out Bed: Dependent Is this a change from baseline?: Change from baseline, expected to last >3 days Walks in Home: Dependent Is this a change from baseline?: Change from baseline, expected to last >3 days Does the patient have difficulty walking or climbing stairs?: Yes Weakness of Legs: Both Weakness of Arms/Hands: Both  Permission Sought/Granted Permission sought to share information with : Chartered certified accountant granted to share  information with : Yes, Verbal Permission Granted  Share Information with NAME: Pete Glatter     Permission granted to share info w Relationship: sister  Permission granted to share info w Contact Information: [phone]  Emotional Assessment Appearance::  Other (Comment Required(Unable to Assess) Attitude/Demeanor/Rapport: Unable to Assess Affect (typically observed): Unable to Assess Orientation: : Oriented to Self, Oriented to  Time Alcohol / Substance Use: Not Applicable Psych Involvement: No (comment)  Admission diagnosis:  Subdural hematoma ([place]) [S06.5X9A] Patient Active Problem List   Diagnosis Date Noted  . Malnutrition of moderate degree 06/01/2019  . Subdural hematoma ([place]) 05/25/2019  . Acute ischemic stroke ([place]) 04/24/2019  . Subdural hemorrhage ([place]) 04/22/2019  . BPH (benign prostatic hyperplasia) 04/07/2019  . Urinary retention due to benign prostatic hyperplasia 04/07/2019  . Bipolar 1 disorder ([place])   . Aspiration pneumonia ([place]) 04/04/2019  . Acute respiratory failure with hypoxia ([place]) 04/04/2019  . Dysphagia 04/04/2019  . Hypothyroidism 04/04/2019  . HLD (hyperlipidemia) 04/04/2019  . Heartburn 03/12/2019  . Preventative health care 04/28/2018  . Alcohol use disorder, mild, in sustained remission 10/09/2017   PCP:  Rosita Fire, MD Pharmacy:   [facility], [address] [address] Phone: [phone] Fax: [phone]     Social Determinants of Health (SDOH) Interventions    Readmission Risk Interventions No flowsheet data found.

## 2019-06-02 NOTE — Progress Notes (Signed)
Inpatient Rehab Admissions Coordinator Note:   Per PT recommendations, pt was screened for CIR candidacy by Gayland Curry, MS, CCC-SLP.  At this time CIR consult is recommended.  I will contact MD for consult order.  Please contact me with questions.   Gayland Curry 06/02/19 2:40 PM

## 2019-06-02 NOTE — Progress Notes (Signed)
Occupational Therapy Treatment Patient Details Name: Ricky Ward MRN: [id number] DOB: 03-Sep-1950 Today's Date: 06/02/2019    History of present illness Pt is 69 yo male who presented with L sided weakness after a fall.  Pt found to have large L hemispheric subdural hematoma with rightward midline shift of 7 mm, L finger laceration, and small acute R frontal MCA infarcts.Pt s/p craniotomy hematoma evacuation on 05/25/19.  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  Pt with PMH including bipolar, hypothyroidism, hyperlipidemia.   OT comments  Pt currently demonstrates decreased balance and R inattention with visual changes. Session focused on awareness to R side. Recommendation changed to CIR to help progress patient. Pt self fed 80% of tray this session when placed in L visual field and min cues for visual challenges during task.    Follow Up Recommendations  CIR    Equipment Recommendations  Other (comment)(defer to next venue)    Recommendations for Other Services Rehab consult    Precautions / Restrictions Precautions Precautions: Fall Restrictions Weight Bearing Restrictions: No       Mobility Bed Mobility Overal bed mobility: Needs Assistance Bed Mobility: Supine to Sit     Supine to sit: HOB elevated;Min assist     General bed mobility comments: assist to scoot hips; multimodal cues for sequencing; increased time needed and redirection   Transfers Overall transfer level: Needs assistance Equipment used: Rolling walker (2 wheeled)(bar of [place] standing frame (turned backwards)) Transfers: Sit to/from Stand Sit to Stand: Mod assist;+2 safety/equipment         General transfer comment: stood X 2 from EOB and then X 1 from recliner; cues for hand placement; assist to power up into standing and then to maintain balance due to posterior bias; multimodal cues for upright posture and attempting to shift weight anteriorly     Balance Overall balance assessment:  Needs assistance Sitting-balance support: Feet supported;Bilateral upper extremity supported Sitting balance-Leahy Scale: Fair   Postural control: Posterior lean Standing balance support: Bilateral upper extremity supported Standing balance-Leahy Scale: Poor                             ADL either performed or assessed with clinical judgement   ADL Overall ADL's : Needs assistance/impaired Eating/Feeding: Minimal assistance;Sitting Eating/Feeding Details (indicate cue type and reason): pt requires cues for entire plate. pt eating only the L lower corner of plate if its in the left lower corner of the tray. pt does not request food or initiate if not in this setup. RN giving ensure from bottle on arrival reporting decr PO intake                 Lower Body Dressing: Maximal assistance Lower Body Dressing Details (indicate cue type and reason): pt holding sock in R hand and no awareness. pt more engaged with L sid eof body and visual field                     Vision   Vision Assessment?: Yes Ocular Range of Motion: Restricted on the right   Perception     Praxis      Cognition Arousal/Alertness: Awake/alert Behavior During Therapy: WFL for tasks assessed/performed Overall Cognitive Status: Impaired/Different from baseline Area of Impairment: Attention;Memory;Following commands;Safety/judgement;Problem solving                 Orientation Level: (states he is in the hospital) Current  Attention Level: Sustained Memory: Decreased short-term memory Following Commands: Follows one step commands inconsistently;Follows one step commands with increased time Safety/Judgement: Decreased awareness of safety;Decreased awareness of deficits   Problem Solving: Requires verbal cues;Difficulty sequencing;Requires tactile cues General Comments: pt is easily distracted and needs cues to redirect to tasks; echolalia at times        Exercises Exercises: Other  exercises Other Exercises Other Exercises: working on R side of body and Visual field with don of socks, socks and self feeding   Shoulder Instructions       General Comments appears to have R visual field cut; able to feed himself if food in visual field (slightly to left)     Pertinent Vitals/ Pain       Pain Assessment: Faces Faces Pain Scale: Hurts little more Pain Location: L hand with weight bearing Pain Descriptors / Indicators: Guarding;Grimacing;Sore Pain Intervention(s): Monitored during session;Repositioned  Home Living                                          Prior Functioning/Environment              Frequency  Min 2X/week        Progress Toward Goals  OT Goals(current goals can now be found in the care plan section)  Progress towards OT goals: Progressing toward goals  Acute Rehab OT Goals Patient Stated Goal: none stated  OT Goal Formulation: With patient Time For Goal Achievement: 06/09/19 Potential to Achieve Goals: Good ADL Goals Pt Will Perform Grooming: with min guard assist;standing Pt Will Perform Lower Body Dressing: with min guard assist;sit to/from stand;sitting/lateral leans Pt Will Transfer to Toilet: ambulating;bedside commode;with min assist Pt Will Perform Toileting - Clothing Manipulation and hygiene: sit to/from stand;with min assist Additional ADL Goal #1: Pt will demonstrate selective attention during ADLs with Min cues Additional ADL Goal #2: Pt will follow two step commands during ADLs with Min cues  Plan Discharge plan needs to be updated    Co-evaluation    PT/OT/SLP Co-Evaluation/Treatment: Yes Reason for Co-Treatment: Complexity of the patient's impairments (multi-system involvement);For patient/therapist safety;To address functional/ADL transfers PT goals addressed during session: Mobility/safety with mobility;Balance OT goals addressed during session: ADL's and self-care;Strengthening/ROM       AM-PAC OT "6 Clicks" Daily Activity     Outcome Measure   Help from another person eating meals?: A Little Help from another person taking care of personal grooming?: A Little Help from another person toileting, which includes using toliet, bedpan, or urinal?: A Lot Help from another person bathing (including washing, rinsing, drying)?: A Lot Help from another person to put on and taking off regular upper body clothing?: A Little Help from another person to put on and taking off regular lower body clothing?: A Lot 6 Click Score: 15    End of Session Equipment Utilized During Treatment: Gait belt  OT Visit Diagnosis: Unsteadiness on feet (R26.81);Other abnormalities of gait and mobility (R26.89);Muscle weakness (generalized) (M62.81);Other symptoms and signs involving cognitive function   Activity Tolerance Patient tolerated treatment well   Patient Left in chair;with call bell/phone within reach;with chair alarm set   Nurse Communication Mobility status        Time: [id number] OT Time Calculation (min): 44 min  Charges: OT General Charges $OT Visit: 1 Visit OT Treatments $Self Care/Home Management : 23-37 mins   B[place], OTR/L  Acute Rehabilitation Services Pager: [phone] Office: [phone] .    Jeri Modena 06/02/2019, 3:00 PM

## 2019-06-03 DIAGNOSIS — F319 Bipolar disorder, unspecified: Secondary | ICD-10-CM

## 2019-06-03 DIAGNOSIS — E039 Hypothyroidism, unspecified: Secondary | ICD-10-CM

## 2019-06-03 DIAGNOSIS — Z2989 Encounter for other specified prophylactic measures: Secondary | ICD-10-CM

## 2019-06-03 DIAGNOSIS — S065X9A Traumatic subdural hemorrhage with loss of consciousness of unspecified duration, initial encounter: Secondary | ICD-10-CM

## 2019-06-03 DIAGNOSIS — Z298 Encounter for other specified prophylactic measures: Secondary | ICD-10-CM

## 2019-06-03 DIAGNOSIS — F1210 Cannabis abuse, uncomplicated: Secondary | ICD-10-CM

## 2019-06-03 DIAGNOSIS — Z720 Tobacco use: Secondary | ICD-10-CM

## 2019-06-03 NOTE — Progress Notes (Signed)
  Speech Language Pathology Treatment: Dysphagia  Patient Details Name: Ricky Ward MRN: [id number] DOB: July 26, 1950 Today's Date: 06/03/2019 Time: [id number] SLP Time Calculation (min) (ACUTE ONLY): 14 min  Assessment / Plan / Recommendation Clinical Impression  Pt seen for dysphagia tx with lunch tray (NTL and purees). He is very easily distracted and has difficulty with focused attention, requiring Mod-Max cues for redirection. Pt also continues to have significant L inattention, but improved as he was provided Max cues and assistance while eating. He was observed with prolonged mastication, but no s/sx of aspiration were noted. Given his current mentation, he continues to remain appropriate for his current diet. Will continue to follow.  Patient may also benefit from a speech-language evaluation to further assess cognitive deficits. Please order if recommended.    HPI HPI: 69 y.o. man s/p craniotomy for 3cm SDH, recovering well. CTH with good evacuation, expected pneumocephalus      SLP Plan  Continue with current plan of care       Recommendations  Diet recommendations: Dysphagia 1 (puree);Nectar-thick liquid Liquids provided via: Cup;Straw Medication Administration: Crushed with puree Supervision: Full supervision/cueing for compensatory strategies Compensations: Slow rate;Small sips/bites;Minimize environmental distractions Postural Changes and/or Swallow Maneuvers: Seated upright 90 degrees                Oral Care Recommendations: Oral care BID Follow up Recommendations: Skilled Nursing facility SLP Visit Diagnosis: Dysphagia, oropharyngeal phase (R13.12) Plan: Continue with current plan of care       [place], Student SLP Office: [phone]  06/03/2019, 1:50 PM

## 2019-06-03 NOTE — Progress Notes (Signed)
Inpatient Rehabilitation Admissions Coordinator  I met with patient briefly at bedside and then contacted hi sister, Narda Rutherford, by phone. There is no caregiver support for him and Narda Rutherford requests that he received his SNF rehab in [place] if possible. Patient admitted himself to the ALF 4 years ago for he was nervous about living alone and had no caregiver support. I have alerted acute team, RN CM, and SW. We will sign off at this time. Please call me with any questions.  Danne Baxter, RN, MSN Rehab Admissions Coordinator [phone] 06/03/2019 1:50 PM

## 2019-06-03 NOTE — Progress Notes (Signed)
Physical Therapy Treatment Patient Details Name: Ricky Ward MRN: [id number] DOB: 15-Sep-1950 Today's Date: 06/03/2019    History of Present Illness Pt is 69 yo male who presented with L sided weakness after a fall.  Pt found to have large L hemispheric subdural hematoma with rightward midline shift of 7 mm, L finger laceration, and small acute R frontal MCA infarcts.Pt s/p craniotomy hematoma evacuation on 05/25/19.  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  Pt with PMH including bipolar, hypothyroidism, hyperlipidemia.    PT Comments    Patient seen for mobility progression. Pt continues to present with impaired mobility and cognition and requires max A for functional transfer training. Pt will benefit from further skilled PT services to decrease risk for falls and caregiver burden. Noted pt has no caregiver support at discharge and family requests SNF vs CIR. PT will continue to progress as tolerated with anticipated d/c to SNF.     Follow Up Recommendations  SNF     Equipment Recommendations  Other (comment)(TBD post acute )    Recommendations for Other Services       Precautions / Restrictions Precautions Precautions: Fall Precaution Comments: subdural drain Restrictions Weight Bearing Restrictions: No    Mobility  Bed Mobility Overal bed mobility: Needs Assistance Bed Mobility: Sit to Supine       Sit to supine: Mod assist   General bed mobility comments: cues for sequencing; assist to bring bilat LE into bed; pt able to position in bed with cues and use of rails   Transfers Overall transfer level: Needs assistance Equipment used: (face to face with gait belt ) Transfers: Sit to/from [employer] Transfers Sit to Stand: Max assist   Squat pivot transfers: Max assist     General transfer comment: partial stands X 3 trials from recliner; pt unable to achieve upright posture; multimodal cues needed; knees blocked and pt holding onto therapist's  arms; use of gait belt; assist to power up into standing and then to pivot recliner to EOB; pt with flexed posture  Ambulation/Gait                 Stairs             Wheelchair Mobility    Modified Rankin (Stroke Patients Only)       Balance Overall balance assessment: Needs assistance Sitting-balance support: Feet supported Sitting balance-Leahy Scale: Fair     Standing balance support: Bilateral upper extremity supported Standing balance-Leahy Scale: Poor                              Cognition Arousal/Alertness: Awake/alert Behavior During Therapy: WFL for tasks assessed/performed Overall Cognitive Status: Impaired/Different from baseline Area of Impairment: Attention;Memory;Following commands;Safety/judgement;Problem solving                   Current Attention Level: Sustained Memory: Decreased short-term memory Following Commands: Follows one step commands with increased time;Follows one step commands consistently Safety/Judgement: Decreased awareness of safety;Decreased awareness of deficits   Problem Solving: Requires verbal cues;Difficulty sequencing;Requires tactile cues        Exercises      General Comments        Pertinent Vitals/Pain Pain Assessment: No/denies pain    Home Living                      Prior Function  PT Goals (current goals can now be found in the care plan section) Acute Rehab PT Goals Patient Stated Goal: none stated  Progress towards PT goals: Progressing toward goals    Frequency    Min 3X/week      PT Plan Discharge plan needs to be updated    Co-evaluation              AM-PAC PT "6 Clicks" Mobility   Outcome Measure  Help needed turning from your back to your side while in a flat bed without using bedrails?: A Little Help needed moving from lying on your back to sitting on the side of a flat bed without using bedrails?: A Little Help needed moving  to and from a bed to a chair (including a wheelchair)?: A Lot Help needed standing up from a chair using your arms (e.g., wheelchair or bedside chair)?: A Lot Help needed to walk in hospital room?: Total Help needed climbing 3-5 steps with a railing? : Total 6 Click Score: 12    End of Session Equipment Utilized During Treatment: Gait belt Activity Tolerance: Patient tolerated treatment well Patient left: with call bell/phone within reach;in bed;with bed alarm set Nurse Communication: Mobility status PT Visit Diagnosis: Muscle weakness (generalized) (M62.81);Difficulty in walking, not elsewhere classified (R26.2);Other symptoms and signs involving the nervous system (R29.898)     Time: [id number] PT Time Calculation (min) (ACUTE ONLY): 23 min  Charges:  $Gait Training: 8-22 mins $Therapeutic Activity: 8-22 mins                     Earney Navy, PTA Acute Rehabilitation Services Pager: [phone] Office: [phone]     Darliss Cheney 06/03/2019, 5:08 PM

## 2019-06-03 NOTE — Progress Notes (Signed)
Neurosurgery Service Progress Note  Subjective: No acute events overnight, no complaints today  Objective: Vitals:   06/02/19 2000 06/03/19 0000 06/03/19 0400 06/03/19 0741  BP: (!) 113/53 (!) 99/53 123/65 122/70  Pulse: 95 71 81 80  Resp:    16  Temp: 98.8 F (37.1 C) 97.6 F (36.4 C) 97.7 F (36.5 C) 97.9 F (36.6 C)  TempSrc: Axillary Axillary Axillary Oral  SpO2: 97% 92% 97% 98%  Weight:      Height:       Temp (24hrs), Avg:98.1 F (36.7 C), Min:97.6 F (36.4 C), Max:98.8 F (37.1 C)  CBC Latest Ref Rng & Units 05/29/2019 05/28/2019 05/25/2019  WBC 4.0 - 10.5 K/uL 8.0 9.6 9.4  Hemoglobin 13.0 - 17.0 g/dL 13.1 13.0 14.6  Hematocrit 39.0 - 52.0 % 39.2 38.5(L) 44.5  Platelets 150 - 400 K/uL 274 241 280   BMP Latest Ref Rng & Units 05/28/2019 05/25/2019 04/24/2019  Glucose 70 - 99 mg/dL 127(H) 162(H) 111(H)  BUN 8 - 23 mg/dL 16 16 12   Creatinine 0.61 - 1.24 mg/dL 1.19 1.15 1.09  BUN/Creat Ratio 6 - 22 (calc) - - -  Sodium 135 - 145 mmol/L 136 142 138  Potassium 3.5 - 5.1 mmol/L 3.7 4.1 4.0  Chloride 98 - 111 mmol/L 98 102 103  CO2 22 - 32 mmol/L 27 28 23   Calcium 8.9 - 10.3 mg/dL 9.0 9.4 8.9    Intake/Output Summary (Last 24 hours) at 06/03/2019 0854 Last data filed at 06/02/2019 2000 Gross per 24 hour  Intake 537 ml  Output --  Net 537 ml    Current Facility-Administered Medications:  .  acetaminophen (TYLENOL) tablet 650 mg, 650 mg, Oral, Q4H PRN, 650 mg at 05/30/19 1002 **OR** acetaminophen (TYLENOL) suppository 650 mg, 650 mg, Rectal, Q4H PRN, Consuella Lose, MD .  atorvastatin (LIPITOR) tablet 40 mg, 40 mg, Oral, q1800, Consuella Lose, MD, 40 mg at 06/02/19 1655 .  chlorhexidine (PERIDEX) 0.12 % solution 15 mL, 15 mL, Mouth Rinse, BID, Consuella Lose, MD, 15 mL at 06/02/19 2226 .  clotrimazole (LOTRIMIN) 1 % cream, , Topical, BID PRN, Consuella Lose, MD .  dextromethorphan-guaiFENesin (MUCINEX DM) 30-600 MG per 12 hr tablet 1 tablet, 1 tablet,  Oral, BID, Consuella Lose, MD, 1 tablet at 06/02/19 2226 .  divalproex (DEPAKOTE SPRINKLE) capsule 250 mg, 250 mg, Oral, QHS, Consuella Lose, MD, 250 mg at 06/02/19 2228 .  docusate sodium (COLACE) capsule 100 mg, 100 mg, Oral, BID, Consuella Lose, MD, 100 mg at 06/02/19 2227 .  dronabinol (MARINOL) capsule 2.5 mg, 2.5 mg, Oral, BID AC, Consuella Lose, MD, 2.5 mg at 06/02/19 1656 .  famotidine (PEPCID) tablet 20 mg, 20 mg, Oral, Daily, Consuella Lose, MD, 20 mg at 06/02/19 1053 .  feeding supplement (ENSURE ENLIVE) (ENSURE ENLIVE) liquid 237 mL, 237 mL, Oral, TID BM, Consuella Lose, MD, 237 mL at 06/02/19 2000 .  heparin injection 5,000 Units, 5,000 Units, Subcutaneous, Q8H, Consuella Lose, MD, 5,000 Units at 06/03/19 [phone] .  HYDROcodone-acetaminophen (NORCO/VICODIN) 5-325 MG per tablet 1 tablet, 1 tablet, Oral, Q4H PRN, Consuella Lose, MD .  hydrocortisone cream 1 % 1 application, 1 application, Topical, Daily, Consuella Lose, MD, 1 application at [id number] .  HYDROmorphone (DILAUDID) injection 0.5 mg, 0.5 mg, Intravenous, Q3H PRN, Consuella Lose, MD, 0.5 mg at 05/27/19 1814 .  ketoconazole (NIZORAL) 2 % cream 1 application, 1 application, Topical, Daily, Consuella Lose, MD, 1 application at [id number] 1100 .  labetalol (NORMODYNE) injection 10-40  mg, 10-40 mg, Intravenous, Q10 min PRN, Consuella Lose, MD, 20 mg at 05/25/19 1659 .  levETIRAcetam (KEPPRA) tablet 500 mg, 500 mg, Oral, BID, Meyran, Ocie Cornfield, NP, 500 mg at 06/02/19 2229 .  levothyroxine (SYNTHROID) tablet 88 mcg, 88 mcg, Oral, Q0600, Consuella Lose, MD, 88 mcg at 06/03/19 0624 .  MEDLINE mouth rinse, 15 mL, Mouth Rinse, q12n4p, Consuella Lose, MD, 15 mL at 06/02/19 1808 .  ondansetron (ZOFRAN) tablet 4 mg, 4 mg, Oral, Q4H PRN **OR** ondansetron (ZOFRAN) injection 4 mg, 4 mg, Intravenous, Q4H PRN, Consuella Lose, MD .  PARoxetine (PAXIL) tablet 20 mg, 20 mg, Oral,  Daily, Consuella Lose, MD, 20 mg at 06/02/19 1053 .  polyethylene glycol (MIRALAX / GLYCOLAX) packet 17 g, 17 g, Oral, Daily PRN, Consuella Lose, MD, 17 g at 05/30/19 1002 .  promethazine (PHENERGAN) tablet 12.5-25 mg, 12.5-25 mg, Oral, Q4H PRN, Consuella Lose, MD .  QUEtiapine (SEROQUEL) tablet 400 mg, 400 mg, Oral, QHS, Consuella Lose, MD, 400 mg at 06/02/19 2227 .  senna-docusate (Senokot-S) tablet 1 tablet, 1 tablet, Oral, Daily PRN, Consuella Lose, MD .  tamsulosin (FLOMAX) capsule 0.4 mg, 0.4 mg, Oral, QHS, Consuella Lose, MD, 0.4 mg at 06/02/19 2227   Physical Exam: Awake/alert, Ox2 (2001 instead of 2021), interactive, appropriate answers to questions, PERRL with baseline XT, FCx4 with full strength Incision c/d/i  Assessment & Plan: 69 y.o. man s/p craniotomy for 3cm SDH, recovering well. CTH with good evacuation, expected pneumocephalus. Confusion POD2, rpt CTH without significant rehemorrhage, overnight EEG with some L sided sharps but no PLEDs/Sz. CXR w/ b/l effusions s/p lasix w/ improvement in CXR and mental status.  -cont home Rx including valproate, levetiracetam -diet dysphagia puree nectar thick -PT/OT rec CIR, PM&R c/s pending -SCDs/TEDs/SQH  Judith Part  06/03/19 8:54 AM

## 2019-06-03 NOTE — Progress Notes (Signed)
Name: Ricky Ward DOB: [birth year]  Please be advised that the above-named patient will require a short-term nursing home stay -- anticipated 30 days or less for rehabilitation and strengthening. The plan is for return home.

## 2019-06-03 NOTE — Consult Note (Signed)
Physical Medicine and Rehabilitation Consult Reason for Consult: Left side weakness Referring Physician: Dr. Venetia Constable   HPI: Ricky Ward is a 69 y.o. right-handed male with history of bipolar disorder, dysphagia, tobacco/marijuana abuse as well as left-sided subdural hematoma 04/22/2019 after a fall followed by neurosurgery that was not causing any symptoms.. History taken chart review due to cognition.  Patient resides at assisted living facility/ [facility] x4 years.  He states his plan is to live with his sister, who provides assistance at discharge, however he also states that his sister had a stroke and is in the hospital.  He performed mobility without assistive device receiving some assistance for ADLs.  He presented on 05/25/2019 with AMS and left hemiparesis.  A follow-up cranial CT scan showed increasing SDH.  Patient underwent left craniotomy evacuation of subdural hematoma 05/25/2019 per Dr. Venetia Constable.  Hospital course complicated by agitation and restlessness.  He continued on Depakote as prior to admission.  Keppra ongoing for seizure prophylaxis.  Subcutaneous heparin added for DVT prophylaxis 05/28/2019.  Hospital course further complicated by dysphagia, started on a dysphagia 1 nectar thick liquid diet.  Therapy evaluations completed with recommendations of physical medicine rehab consult.  Review of Systems  Unable to perform ROS: Acuity of condition   Past Medical History:  Diagnosis Date  . Aspiration pneumonia ([place])   . Bipolar 1 disorder ([place])   . Constipation   . Dysphagia   . Hypercholesterolemia   . Psoriasis   . Stroke [facility])    ischemic stroke  . Subdural hematoma ([place])   . Thyroid disease    hypothyroid   Past Surgical History:  Procedure Laterality Date  . CRANIOTOMY Left 05/25/2019   Procedure: CRANIOTOMY HEMATOMA EVACUATION SUBDURAL;  Surgeon: Judith Part, MD;  Location: [place];  Service: Neurosurgery;  Laterality: Left;  CRANIOTOMY HEMATOMA  EVACUATION SUBDURAL  . FOOT SURGERY  08/23/2003  . WRIST FRACTURE SURGERY Left    Family History  Problem Relation Age of Onset  . Alcohol abuse Brother   . Colon cancer Neg Hx    Social History:  reports that he has quit smoking. His smoking use included cigarettes. He has never used smokeless tobacco. He reports that he does not drink alcohol or use drugs. Allergies:  Allergies  Allergen Reactions  . Penicillins Swelling    Has patient had a PCN reaction causing immediate rash, facial/tongue/throat swelling, SOB or lightheadedness with hypotension: Unknown Has patient had a PCN reaction causing severe rash involving mucus membranes or skin necrosis: Unknown Has patient had a PCN reaction that required hospitalization: Unknown Has patient had a PCN reaction occurring within the last 10 years: No If all of the above answers are "NO", then may proceed with Cephalosporin use.    Medications Prior to Admission  Medication Sig Dispense Refill  . atorvastatin (LIPITOR) 40 MG tablet Take 1 tablet (40 mg total) by mouth daily at 6 PM.    . Calcipotriene-Betameth Diprop (ENSTILAR) 0.005-0.064 % FOAM Apply 1 application topically See admin instructions. Apply to affected areas of elbows, trunk & extremities once daily for red scaling psoriasis leions    . clotrimazole-betamethasone (LOTRISONE) cream Apply 1 application topically 2 (two) times daily as needed (rash).    . divalproex (DEPAKOTE) 250 MG DR tablet Take 1 tablet (250 mg total) by mouth at bedtime. 90 tablet 0  . dronabinol (MARINOL) 2.5 MG capsule Take 2.5 mg by mouth 2 (two) times daily before a meal.    .  famotidine (PEPCID) 20 MG tablet Take 1 tablet (20 mg total) by mouth as needed for heartburn or indigestion (May use once to twice daily). (Patient taking differently: Take 20 mg by mouth 2 (two) times daily as needed for heartburn or indigestion (May use once to twice daily). ) 60 tablet 5  . hydrocortisone 2.5 % cream Apply 1  application topically See admin instructions. Apply to affected areas of face, scalp, neck and behind ears once daily.    Marland Kitchen ketoconazole (NIZORAL) 2 % cream Apply 1 application topically daily. Applied to the face, scalp, neck, and behind the ears    . ketoconazole (NIZORAL) 2 % shampoo Apply 1 application topically daily.    Marland Kitchen levETIRAcetam (KEPPRA) 500 MG tablet Take 1 tablet (500 mg total) by mouth 2 (two) times daily.    Marland Kitchen levothyroxine (SYNTHROID) 88 MCG tablet Take 1 tablet (88 mcg total) by mouth daily. 30 tablet 11  . Magnesium Oxide (MAG-OXIDE) 200 MG TABS Take 2 tablets (400 mg total) by mouth in the morning and at bedtime. (Patient taking differently: Take 200 mg by mouth in the morning and at bedtime. )  0  . PARoxetine (PAXIL) 20 MG tablet Take 1 tablet (20 mg total) by mouth daily. 30 tablet 2  . QUEtiapine (SEROQUEL) 400 MG tablet Take 1 tablet (400 mg total) by mouth at bedtime.    Marland Kitchen QUEtiapine (SEROQUEL) 50 MG tablet Take 1 tablet (50 mg total) by mouth at bedtime.    . sennosides-docusate sodium (SENOKOT-S) 8.6-50 MG tablet Take 1 tablet by mouth daily as needed for constipation.    . tamsulosin (FLOMAX) 0.4 MG CAPS capsule Take 0.4 mg by mouth at bedtime.      Home: Home Living Family/patient expects to be discharged to:: [place]: [place] - 4 wheels, Shower seat Additional Comments: [facility] per chart  Functional History: Prior Function Level of Independence: Needs assistance Gait / Transfers Assistance Needed: Reports he does not use DME ADL's / Homemaking Assistance Needed: Per chart, assistance with showering Functional Status:  Mobility: Bed Mobility Overal bed mobility: Needs Assistance Bed Mobility: Supine to Sit Supine to sit: HOB elevated, Min assist Sit to supine: +2 for physical assistance, Total assist General bed mobility comments: assist to scoot hips; multimodal cues for sequencing; increased time needed and redirection    Transfers Overall transfer level: Needs assistance Equipment used: Rolling walker (2 wheeled)(bar of Stedy standing frame (turned backwards)) Transfers: Sit to/from Stand Sit to Stand: Mod assist, +2 safety/equipment Stand pivot transfers: Mod assist General transfer comment: stood X 2 from EOB and then X 1 from recliner; cues for hand placement; assist to power up into standing and then to maintain balance due to posterior bias; multimodal cues for upright posture and attempting to shift weight anteriorly  Ambulation/Gait Ambulation/Gait assistance: Mod assist, +2 physical assistance, +2 safety/equipment, Max assist Gait Distance (Feet): (~8 ft total ) Assistive device: Rolling walker (2 wheeled) Gait Pattern/deviations: Shuffle, Trunk flexed, Step-to pattern General Gait Details: heavy posterior bias; multimodal cues for upright posture; vc for increased step lengths; pt with flexed bilat LE and trunk and unable to increased step lengths; OT in front of pt for a few feet to redirect gaze as pt has L gaze preference  Gait velocity: decreased    ADL: ADL Overall ADL's : Needs assistance/impaired Eating/Feeding: Minimal assistance, Sitting Eating/Feeding Details (indicate cue type and reason): pt requires cues for entire plate. pt eating only the L lower  corner of plate if its in the left lower corner of the tray. pt does not request food or initiate if not in this setup. RN giving ensure from bottle on arrival reporting decr PO intake Grooming: Minimal assistance, Sitting Upper Body Bathing: Minimal assistance, Sitting Lower Body Bathing: Moderate assistance, Sit to/from stand, Maximal assistance Upper Body Dressing : Minimal assistance, Sitting Lower Body Dressing: Maximal assistance Lower Body Dressing Details (indicate cue type and reason): pt holding sock in R hand and no awareness. pt more engaged with L sid eof body and visual field Toilet Transfer: Moderate assistance,  Stand-pivot(simulated to recline) Functional mobility during ADLs: Moderate assistance(stand pivot)  Cognition: Cognition Overall Cognitive Status: Impaired/Different from baseline Orientation Level: Oriented to person, Oriented to time, Disoriented to place, Disoriented to situation Cognition Arousal/Alertness: Awake/alert Behavior During Therapy: WFL for tasks assessed/performed Overall Cognitive Status: Impaired/Different from baseline Area of Impairment: Attention, Memory, Following commands, Safety/judgement, Problem solving Orientation Level: (states he is in the hospital) Current Attention Level: Sustained Memory: Decreased short-term memory Following Commands: Follows one step commands inconsistently, Follows one step commands with increased time Safety/Judgement: Decreased awareness of safety, Decreased awareness of deficits Awareness: Intellectual Problem Solving: Requires verbal cues, Difficulty sequencing, Requires tactile cues General Comments: pt is easily distracted and needs cues to redirect to tasks; echolalia at times  Blood pressure 123/65, pulse 81, temperature 97.7 F (36.5 C), temperature source Axillary, resp. rate 20, height 5\' 7"  (1.702 m), weight 77.6 kg, SpO2 97 %. Physical Exam  Vitals reviewed. Constitutional: He appears well-developed and well-nourished.  HENT:  Craniotomy C/D/I  Eyes: EOM are normal. Right eye exhibits no discharge. Left eye exhibits no discharge.  Neck: No tracheal deviation present. No thyromegaly present.  Cardiovascular: Normal rate.  Respiratory: Effort normal. No respiratory distress.  GI: Soft. He exhibits no distension.  Musculoskeletal:     Comments: No edema or tenderness in extremities  Neurological: He is alert.  Oriented x2 He is a very limited medical historian.   Follows simple commands. HOH Left knee Motor: Right upper extremity/right lower extremity: 5/5 proximal distally left: 4-5 proximal distally left lower  extremity: Hip flexion, knee extension 2-/5, ankle dorsiflexion 3+/5  Skin:  Keratotic lesions on face  Psychiatric:  Limited due to mentation, but slowed and confused    No results found for this or any previous visit (from the past 24 hour(s)). No results found.  Assessment/Plan: Diagnosis: Left SDH status post craniotomy/TBI Labs independently reviewed.  Records reviewed and summated above.  1. Does the need for close, 24 hr/day medical supervision in concert with the patient's rehab needs make it unreasonable for this patient to be served in a less intensive setting? Yes 2. Co-Morbidities requiring supervision/potential complications: bipolar disorder, dysphagia (advance diet as tolerated), tobacco/marijuana (counseled on appropriate), seizure prophylaxis (continue Keppra), hypothyroidism (cont meds, ensure appropriate mood and energy level for therapies)  3. Due to safety, disease management, medication administration and patient education, does the patient require 24 hr/day rehab nursing? Yes 4. Does the patient require coordinated care of a physician, rehab nurse, therapy disciplines of PT/OT/SLP to address physical and functional deficits in the context of the above medical diagnosis(es)? Yes Addressing deficits in the following areas: balance, endurance, locomotion, strength, transferring, bathing, dressing, toileting, cognition, speech, swallowing and psychosocial support 5. Can the patient actively participate in an intensive therapy program of at least 3 hrs of therapy per day at least 5 days per week? Yes 6. The potential for patient to make  measurable gains while on inpatient rehab is excellent 7. Anticipated functional outcomes upon discharge from inpatient rehab are supervision and min assist  with PT, supervision and min assist with OT, supervision and min assist with SLP. 8. Estimated rehab length of stay to reach the above functional goals is: 14-17 days. 9. Anticipated  discharge destination: TBD. 10. Overall Rehab/Functional Prognosis: good  RECOMMENDATIONS: This patient's condition is appropriate for continued rehabilitative care in the following setting: CIR caregiver support available upon discharge. Patient has agreed to participate in recommended program. Potentially Note that insurance prior authorization may be required for reimbursement for recommended care.  Comment: Rehab Admissions Coordinator to follow up.  I have personally performed a face to face diagnostic evaluation, including, but not limited to relevant history and physical exam findings, of this patient and developed relevant assessment and plan.  Additionally, I have reviewed and concur with the physician assistant's documentation above.   Delice Lesch, MD, ABPMR Lavon Paganini Angiulli, PA-C 06/03/2019

## 2019-06-03 NOTE — TOC Progression Note (Signed)
Transition of Care [facility]) - Progression Note    Patient Details  Name: Ricky Ward MRN: [id number] Date of Birth: 20-Jul-1950  Transition of Care [facility]) CM/SW [place], [place] Work Phone Number: 06/03/2019, 4:17 PM  Clinical Narrative:    MSW Intern reached out to the sister Ricky Ward, to give bed offers, multiple voicemails were left. Pt's daughter was contacted as well, a voicemail was left. SW will continue to follow.   Expected Discharge Plan: [place] Barriers to Discharge: Continued Medical Work up  Expected Discharge Plan and Services Expected Discharge Plan: [place] In-house Referral: Clinical Social Work Discharge Planning Services: CM Consult Post Acute Care Choice: [place] arrangements for the past 2 months: [place]                                       Social Determinants of Health (SDOH) Interventions    Readmission Risk Interventions No flowsheet data found.

## 2019-06-04 LAB — RESPIRATORY PANEL BY RT PCR (FLU A&B, COVID)
Influenza A by PCR: NEGATIVE
Influenza B by PCR: NEGATIVE
SARS Coronavirus 2 by RT PCR: NEGATIVE

## 2019-06-04 NOTE — Progress Notes (Addendum)
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  Ongoing.  GOAL:   Patient will meet greater than or equal to 90% of their needs  Progressing.   MONITOR:   PO intake, Supplement acceptance  REASON FOR ASSESSMENT:   Consult Assessment of nutrition requirement/status  ASSESSMENT:   Pt with PMH of bipolar 1 disorder, psoriasis, stroke, SDH, hypothyroidism who was recently seen for a SDH now admitted with mental status change from his ALF, per head CT pt with large acute on chronic SDH now s/p emergent crani.  Pt unable to provide clear history today, but does state his appetite is good and that he likes the Ensure. Discussed pt with RN who reports pt is typically drinking supplements well.   PO Intake: 10-100% x last 7 recorded meals (69% average meal intake)  Labs reviewed.  Medications reviewed and include: colace, marinol, Pepcid, Ensure Enlive TID   Diet Order:   Diet Order            DIET - DYS 1 Room service appropriate? Yes with Assist; Fluid consistency: Nectar Thick  Diet effective now              EDUCATION NEEDS:   No education needs have been identified at this time  Skin:  Skin Assessment: Skin Integrity Issues: Skin Integrity Issues:: Incisions Incisions: head  Last BM:  06/03/19  Height:   Ht Readings from Last 1 Encounters:  05/25/19 5\' 7"  (1.702 m)    Weight:   Wt Readings from Last 1 Encounters:  05/25/19 77.6 kg    BMI:  Body mass index is 26.79 kg/m.  Estimated Nutritional Needs:   Kcal:  2000-2300  Protein:  100-120 grams  Fluid:  >2 L/day   Larkin Ina, MS, RD, LDN RD pager number and  weekend/on-call pager number located in [place].

## 2019-06-04 NOTE — Consult Note (Signed)
   [facility] CM Inpatient Consult   06/04/2019  Ricky Ward 1950-03-28 [id number]   Patient screened for high risk score for unplanned readmission score and 10 day LOS hospitalization to check if potential [place] Management services are needed in the Medicare [place].  Patient is from [facility].  Primary Care Provider is Rosita Fire, MD   Plan: If patient goes to a [facility] affiliated SNF, will have [facility] RN [facility] nurse follow.  Continue to follow progress and disposition to assess for post hospital care management needs.    Please place a [facility] Care Management consult as appropriate and for questions contact:   Natividad Brood, RN BSN [place] [facility] Liaison  [phone] business mobile phone Toll free office [phone]  Fax number: [phone] [email or web address]

## 2019-06-04 NOTE — Progress Notes (Signed)
Neurosurgery Service Progress Note  Subjective: No acute events overnight, no complaints today  Objective: Vitals:   06/03/19 2058 06/03/19 2340 06/04/19 0342 06/04/19 0802  BP: 127/69 117/61 (!) 141/71 122/78  Pulse: 82 85 75 82  Resp: 16 16 18 16   Temp: 98.7 F (37.1 C) 98.4 F (36.9 C) 98.3 F (36.8 C) 98.1 F (36.7 C)  TempSrc:    Oral  SpO2: 95% 93% 95% 95%  Weight:      Height:       Temp (24hrs), Avg:98.3 F (36.8 C), Min:98 F (36.7 C), Max:98.7 F (37.1 C)  CBC Latest Ref Rng & Units 05/29/2019 05/28/2019 05/25/2019  WBC 4.0 - 10.5 K/uL 8.0 9.6 9.4  Hemoglobin 13.0 - 17.0 g/dL 13.1 13.0 14.6  Hematocrit 39.0 - 52.0 % 39.2 38.5(L) 44.5  Platelets 150 - 400 K/uL 274 241 280   BMP Latest Ref Rng & Units 05/28/2019 05/25/2019 04/24/2019  Glucose 70 - 99 mg/dL 127(H) 162(H) 111(H)  BUN 8 - 23 mg/dL 16 16 12   Creatinine 0.61 - 1.24 mg/dL 1.19 1.15 1.09  BUN/Creat Ratio 6 - 22 (calc) - - -  Sodium 135 - 145 mmol/L 136 142 138  Potassium 3.5 - 5.1 mmol/L 3.7 4.1 4.0  Chloride 98 - 111 mmol/L 98 102 103  CO2 22 - 32 mmol/L 27 28 23   Calcium 8.9 - 10.3 mg/dL 9.0 9.4 8.9   No intake or output data in the 24 hours ending 06/04/19 0858  Current Facility-Administered Medications:  .  acetaminophen (TYLENOL) tablet 650 mg, 650 mg, Oral, Q4H PRN, 650 mg at 05/30/19 1002 **OR** acetaminophen (TYLENOL) suppository 650 mg, 650 mg, Rectal, Q4H PRN, Consuella Lose, MD .  atorvastatin (LIPITOR) tablet 40 mg, 40 mg, Oral, q1800, Consuella Lose, MD, 40 mg at 06/03/19 1737 .  chlorhexidine (PERIDEX) 0.12 % solution 15 mL, 15 mL, Mouth Rinse, BID, Consuella Lose, MD, 15 mL at 06/03/19 2142 .  clotrimazole (LOTRIMIN) 1 % cream, , Topical, BID PRN, Consuella Lose, MD .  dextromethorphan-guaiFENesin ([place] DM) 30-600 MG per 12 hr tablet 1 tablet, 1 tablet, Oral, BID, Consuella Lose, MD, 1 tablet at 06/03/19 2146 .  divalproex (DEPAKOTE SPRINKLE) capsule 250 mg, 250 mg,  Oral, QHS, Consuella Lose, MD, 250 mg at 06/03/19 2147 .  docusate sodium (COLACE) capsule 100 mg, 100 mg, Oral, BID, Consuella Lose, MD, 100 mg at 06/03/19 2200 .  dronabinol (MARINOL) capsule 2.5 mg, 2.5 mg, Oral, BID AC, Consuella Lose, MD, 2.5 mg at 06/03/19 1737 .  famotidine (PEPCID) tablet 20 mg, 20 mg, Oral, Daily, Consuella Lose, MD, 20 mg at 06/03/19 1048 .  feeding supplement (ENSURE ENLIVE) (ENSURE ENLIVE) liquid 237 mL, 237 mL, Oral, TID BM, Consuella Lose, MD, 237 mL at 06/03/19 2100 .  heparin injection 5,000 Units, 5,000 Units, Subcutaneous, Q8H, Consuella Lose, MD, 5,000 Units at 06/04/19 0603 .  HYDROcodone-acetaminophen (NORCO/VICODIN) 5-325 MG per tablet 1 tablet, 1 tablet, Oral, Q4H PRN, Consuella Lose, MD .  hydrocortisone cream 1 % 1 application, 1 application, Topical, Daily, Consuella Lose, MD, 1 application at [id number] 1048 .  HYDROmorphone (DILAUDID) injection 0.5 mg, 0.5 mg, Intravenous, Q3H PRN, Consuella Lose, MD, 0.5 mg at 05/27/19 1814 .  ketoconazole (NIZORAL) 2 % cream 1 application, 1 application, Topical, Daily, Consuella Lose, MD, 1 application at [id number] 1048 .  labetalol (NORMODYNE) injection 10-40 mg, 10-40 mg, Intravenous, Q10 min PRN, Consuella Lose, MD, 20 mg at 05/25/19 1659 .  levETIRAcetam (KEPPRA) tablet 500  mg, 500 mg, Oral, BID, Meyran, Ocie Cornfield, NP, 500 mg at 06/03/19 2146 .  levothyroxine (SYNTHROID) tablet 88 mcg, 88 mcg, Oral, Q0600, Consuella Lose, MD, 88 mcg at 06/04/19 0603 .  MEDLINE mouth rinse, 15 mL, Mouth Rinse, q12n4p, Consuella Lose, MD, 15 mL at 06/03/19 1602 .  ondansetron (ZOFRAN) tablet 4 mg, 4 mg, Oral, Q4H PRN **OR** ondansetron (ZOFRAN) injection 4 mg, 4 mg, Intravenous, Q4H PRN, Consuella Lose, MD .  PARoxetine (PAXIL) tablet 20 mg, 20 mg, Oral, Daily, Consuella Lose, MD, 20 mg at 06/03/19 1047 .  polyethylene glycol (MIRALAX / GLYCOLAX) packet 17 g, 17 g, Oral,  Daily PRN, Consuella Lose, MD, 17 g at 05/30/19 1002 .  promethazine (PHENERGAN) tablet 12.5-25 mg, 12.5-25 mg, Oral, Q4H PRN, Consuella Lose, MD .  QUEtiapine (SEROQUEL) tablet 400 mg, 400 mg, Oral, QHS, Consuella Lose, MD, 400 mg at 06/03/19 2200 .  senna-docusate (Senokot-S) tablet 1 tablet, 1 tablet, Oral, Daily PRN, Consuella Lose, MD .  tamsulosin (FLOMAX) capsule 0.4 mg, 0.4 mg, Oral, QHS, Consuella Lose, MD, 0.4 mg at 06/03/19 2158   Physical Exam: Awake/alert, Ox2, interactive, appropriate answers to questions, PERRL with baseline XT, FCx4 with full strength Incision c/d/i  Assessment & Plan: 69 y.o. man s/p craniotomy for 3cm SDH, recovering well. CTH with good evacuation, expected pneumocephalus. Confusion POD2, rpt CTH without significant rehemorrhage, overnight EEG with some L sided sharps but no PLEDs/Sz. CXR w/ b/l effusions s/p lasix w/ improvement in CXR and mental status.  -cont home Rx including valproate, levetiracetam -diet dysphagia puree nectar thick -PT/OT rec SNF, SNF discharge pending -SCDs/TEDs/SQH  Judith Part  06/04/19 8:58 AM

## 2019-06-05 NOTE — Progress Notes (Signed)
Physical Therapy Treatment Patient Details Name: Ricky Ward MRN: [id number] DOB: 25-Nov-1950 Today's Date: 06/05/2019    History of Present Illness Pt is 69 yo male who presented with L sided weakness after a fall.  Pt found to have large L hemispheric subdural hematoma with rightward midline shift of 7 mm, L finger laceration, and small acute R frontal MCA infarcts.Pt s/p craniotomy hematoma evacuation on 05/25/19.  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  Pt with PMH including bipolar, hypothyroidism, hyperlipidemia.    PT Comments    Patient seen for mobility progression. Pt presents with increased confusion compared to previous session and requires increased assistance for all mobility. End of session pt set up with breakfast tray in visual field and pt feeding himself. Continue to progress as tolerated with anticipated d/c to SNF for further skilled PT services.      Follow Up Recommendations  SNF     Equipment Recommendations  Other (comment)(TBD post acute )    Recommendations for Other Services       Precautions / Restrictions Precautions Precautions: Fall Restrictions Weight Bearing Restrictions: No    Mobility  Bed Mobility Overal bed mobility: Needs Assistance Bed Mobility: Sit to Supine;Supine to Sit     Supine to sit: HOB elevated;Max assist Sit to supine: Max assist;+2 for safety/equipment   General bed mobility comments: multimodal cues for sequencing; max A due to poor motor planning and pt distracted by incision on head  Transfers Overall transfer level: Needs assistance Equipment used: (+2 face to face with gait belt ) Transfers: Sit to/from Stand Sit to Stand: Max assist;+2 physical assistance         General transfer comment: attempted to stand from EOB however unable to achieve standing; pt with posterior bias   Ambulation/Gait                 Stairs             Wheelchair Mobility    Modified Rankin (Stroke  Patients Only)       Balance Overall balance assessment: Needs assistance Sitting-balance support: Feet supported;Bilateral upper extremity supported Sitting balance-Leahy Scale: Zero   Postural control: Posterior lean Standing balance support: Bilateral upper extremity supported Standing balance-Leahy Scale: Zero                              Cognition Arousal/Alertness: Awake/alert Behavior During Therapy: Restless;Flat affect Overall Cognitive Status: Impaired/Different from baseline Area of Impairment: Attention;Memory;Following commands;Safety/judgement;Problem solving;Orientation                 Orientation Level: Disoriented to;Place;Time;Situation Current Attention Level: Sustained Memory: Decreased short-term memory Following Commands: Follows one step commands with increased time;Follows one step commands inconsistently Safety/Judgement: Decreased awareness of safety;Decreased awareness of deficits   Problem Solving: Requires verbal cues;Difficulty sequencing;Requires tactile cues;Decreased initiation;Slow processing General Comments: pt appears more confused and with increased posterior lean than previous session       Exercises      General Comments        Pertinent Vitals/Pain Pain Assessment: Faces Faces Pain Scale: No hurt    Home Living                      Prior Function            PT Goals (current goals can now be found in the care plan section) Progress towards PT  goals: Progressing toward goals    Frequency    Min 3X/week      PT Plan Current plan remains appropriate    Co-evaluation              AM-PAC PT "6 Clicks" Mobility   Outcome Measure  Help needed turning from your back to your side while in a flat bed without using bedrails?: A Lot Help needed moving from lying on your back to sitting on the side of a flat bed without using bedrails?: A Lot Help needed moving to and from a bed to a chair  (including a wheelchair)?: A Lot Help needed standing up from a chair using your arms (e.g., wheelchair or bedside chair)?: A Lot Help needed to walk in hospital room?: Total Help needed climbing 3-5 steps with a railing? : Total 6 Click Score: 10    End of Session Equipment Utilized During Treatment: Gait belt Activity Tolerance: Patient tolerated treatment well Patient left: with call bell/phone within reach;in bed;with bed alarm set Nurse Communication: Mobility status PT Visit Diagnosis: Muscle weakness (generalized) (M62.81);Difficulty in walking, not elsewhere classified (R26.2);Other symptoms and signs involving the nervous system (R29.898)     Time: [id number] PT Time Calculation (min) (ACUTE ONLY): 29 min  Charges:  $Therapeutic Activity: 23-37 mins                     Ricky Ward, PTA Acute Rehabilitation Services Pager: [phone] Office: [phone]     Ricky Ward 06/05/2019, 2:11 PM

## 2019-06-05 NOTE — Progress Notes (Signed)
Neurosurgery Service Progress Note  Subjective: No acute events overnight, no complaints today, a little more interactive this morning  Objective: Vitals:   06/04/19 1657 06/04/19 2123 06/05/19 0029 06/05/19 0430  BP: 117/68 124/72 105/63 115/67  Pulse: 84 82 95 95  Resp: 17 16 18 16   Temp: 98.8 F (37.1 C) 98.7 F (37.1 C) 98.7 F (37.1 C) 98.2 F (36.8 C)  TempSrc: Oral     SpO2: 96% 96% 94% 95%  Weight:      Height:       Temp (24hrs), Avg:98.4 F (36.9 C), Min:97.7 F (36.5 C), Max:98.8 F (37.1 C)  CBC Latest Ref Rng & Units 05/29/2019 05/28/2019 05/25/2019  WBC 4.0 - 10.5 K/uL 8.0 9.6 9.4  Hemoglobin 13.0 - 17.0 g/dL 13.1 13.0 14.6  Hematocrit 39.0 - 52.0 % 39.2 38.5(L) 44.5  Platelets 150 - 400 K/uL 274 241 280   BMP Latest Ref Rng & Units 05/28/2019 05/25/2019 04/24/2019  Glucose 70 - 99 mg/dL 127(H) 162(H) 111(H)  BUN 8 - 23 mg/dL 16 16 12   Creatinine 0.61 - 1.24 mg/dL 1.19 1.15 1.09  BUN/Creat Ratio 6 - 22 (calc) - - -  Sodium 135 - 145 mmol/L 136 142 138  Potassium 3.5 - 5.1 mmol/L 3.7 4.1 4.0  Chloride 98 - 111 mmol/L 98 102 103  CO2 22 - 32 mmol/L 27 28 23   Calcium 8.9 - 10.3 mg/dL 9.0 9.4 8.9    Intake/Output Summary (Last 24 hours) at 06/05/2019 0848 Last data filed at 06/04/2019 2200 Gross per 24 hour  Intake 240 ml  Output --  Net 240 ml    Current Facility-Administered Medications:  .  acetaminophen (TYLENOL) tablet 650 mg, 650 mg, Oral, Q4H PRN, 650 mg at 05/30/19 1002 **OR** acetaminophen (TYLENOL) suppository 650 mg, 650 mg, Rectal, Q4H PRN, Consuella Lose, MD .  atorvastatin (LIPITOR) tablet 40 mg, 40 mg, Oral, q1800, Consuella Lose, MD, 40 mg at 06/04/19 1648 .  chlorhexidine (PERIDEX) 0.12 % solution 15 mL, 15 mL, Mouth Rinse, BID, Consuella Lose, MD, 15 mL at 06/04/19 2227 .  clotrimazole (LOTRIMIN) 1 % cream, , Topical, BID PRN, Consuella Lose, MD .  dextromethorphan-guaiFENesin (MUCINEX DM) 30-600 MG per 12 hr tablet 1  tablet, 1 tablet, Oral, BID, Consuella Lose, MD, 1 tablet at 06/04/19 2225 .  divalproex (DEPAKOTE SPRINKLE) capsule 250 mg, 250 mg, Oral, QHS, Consuella Lose, MD, 250 mg at 06/04/19 2223 .  docusate sodium (COLACE) capsule 100 mg, 100 mg, Oral, BID, Consuella Lose, MD, 100 mg at 06/04/19 1053 .  dronabinol (MARINOL) capsule 2.5 mg, 2.5 mg, Oral, BID AC, Consuella Lose, MD, 2.5 mg at 06/04/19 1648 .  famotidine (PEPCID) tablet 20 mg, 20 mg, Oral, Daily, Consuella Lose, MD, 20 mg at 06/04/19 1054 .  feeding supplement (ENSURE ENLIVE) (ENSURE ENLIVE) liquid 237 mL, 237 mL, Oral, TID BM, Consuella Lose, MD, 237 mL at 06/04/19 2225 .  heparin injection 5,000 Units, 5,000 Units, Subcutaneous, Q8H, Consuella Lose, MD, 5,000 Units at 06/05/19 [phone] .  HYDROcodone-acetaminophen (NORCO/VICODIN) 5-325 MG per tablet 1 tablet, 1 tablet, Oral, Q4H PRN, Consuella Lose, MD .  hydrocortisone cream 1 % 1 application, 1 application, Topical, Daily, Consuella Lose, MD, 1 application at [id number] 1054 .  HYDROmorphone (DILAUDID) injection 0.5 mg, 0.5 mg, Intravenous, Q3H PRN, Consuella Lose, MD, 0.5 mg at 05/27/19 1814 .  ketoconazole (NIZORAL) 2 % cream 1 application, 1 application, Topical, Daily, Consuella Lose, MD, 1 application at [id number] 1055 .  labetalol (NORMODYNE) injection 10-40 mg, 10-40 mg, Intravenous, Q10 min PRN, Consuella Lose, MD, 20 mg at 05/25/19 1659 .  levETIRAcetam (KEPPRA) tablet 500 mg, 500 mg, Oral, BID, Meyran, Ocie Cornfield, NP, 500 mg at 06/04/19 2225 .  levothyroxine (SYNTHROID) tablet 88 mcg, 88 mcg, Oral, Q0600, Consuella Lose, MD, 88 mcg at 06/05/19 [id number] .  MEDLINE mouth rinse, 15 mL, Mouth Rinse, q12n4p, Consuella Lose, MD, 15 mL at 06/04/19 1649 .  ondansetron (ZOFRAN) tablet 4 mg, 4 mg, Oral, Q4H PRN **OR** ondansetron (ZOFRAN) injection 4 mg, 4 mg, Intravenous, Q4H PRN, Consuella Lose, MD .  PARoxetine (PAXIL) tablet 20 mg,  20 mg, Oral, Daily, Consuella Lose, MD, 20 mg at 06/04/19 1055 .  polyethylene glycol (MIRALAX / GLYCOLAX) packet 17 g, 17 g, Oral, Daily PRN, Consuella Lose, MD, 17 g at 05/30/19 1002 .  promethazine (PHENERGAN) tablet 12.5-25 mg, 12.5-25 mg, Oral, Q4H PRN, Consuella Lose, MD .  QUEtiapine (SEROQUEL) tablet 400 mg, 400 mg, Oral, QHS, Consuella Lose, MD, 400 mg at 06/04/19 2225 .  senna-docusate (Senokot-S) tablet 1 tablet, 1 tablet, Oral, Daily PRN, Consuella Lose, MD .  tamsulosin (FLOMAX) capsule 0.4 mg, 0.4 mg, Oral, QHS, Consuella Lose, MD, 0.4 mg at 06/04/19 2225   Physical Exam: Awake/alert, Ox1, interactive, appropriate answers to questions, PERRL with baseline XT, FCx4 with full strength Incision c/d/i  Assessment & Plan: 69 y.o. man s/p craniotomy for 3cm SDH, recovering well. CTH with good evacuation, expected pneumocephalus. Confusion POD2, rpt CTH without significant rehemorrhage, overnight EEG with some L sided sharps but no PLEDs/Sz. CXR w/ b/l effusions s/p lasix w/ improvement in CXR and mental status.  -cont home Rx including valproate, levetiracetam -diet dysphagia puree nectar thick -PT/OT rec SNF, SNF discharge pending, will prepare discharge paperwork -Estelle  06/05/19 8:48 AM

## 2019-06-05 NOTE — Discharge Summary (Addendum)
Discharge Summary  Date of Admission: 05/25/2019  Date of Discharge: 06/08/19  Attending Physician: Emelda Brothers, MD  Hospital Course: Patient was admitted for progressive obtundation and was found to have a large expansion of his known left subdural hematoma. He was taken to the OR on 05/25/19 for a left craniotomy for hematoma evacuation. He was recovered in PACU and transferred to 4N. His hospital course was uncomplicated and the patient was discharged back to SNF on 06/08/19. He will follow up in clinic with me in 2 weeks.  Neurologic exam at discharge:  AOx1, PERRL, EOMI except for baseline b/l XT, FS, TM Strength 5/5 x4, SILTx4 Incision c/d/i  Discharge diagnosis: Subdural hematoma  Judith Part, MD 06/05/19 8:51 AM   Allergies as of 06/08/2019      Reactions   Penicillins Swelling   Has patient had a PCN reaction causing immediate rash, facial/tongue/throat swelling, SOB or lightheadedness with hypotension: Unknown Has patient had a PCN reaction causing severe rash involving mucus membranes or skin necrosis: Unknown Has patient had a PCN reaction that required hospitalization: Unknown Has patient had a PCN reaction occurring within the last 10 years: No If all of the above answers are "NO", then may proceed with Cephalosporin use.      Medication List    TAKE these medications   atorvastatin 40 MG tablet Commonly known as: LIPITOR Take 1 tablet (40 mg total) by mouth daily at 6 PM.   clotrimazole-betamethasone cream Commonly known as: LOTRISONE Apply 1 application topically 2 (two) times daily as needed (rash).   divalproex 250 MG DR tablet Commonly known as: Depakote Take 1 tablet (250 mg total) by mouth at bedtime.   dronabinol 2.5 MG capsule Commonly known as: MARINOL Take 2.5 mg by mouth 2 (two) times daily before a meal.   Enstilar 0.005-0.064 % Foam Generic drug: Calcipotriene-Betameth Diprop Apply 1 application topically See admin instructions.  Apply to affected areas of elbows, trunk & extremities once daily for red scaling psoriasis leions   famotidine 20 MG tablet Commonly known as: Pepcid Take 1 tablet (20 mg total) by mouth as needed for heartburn or indigestion (May use once to twice daily). What changed: when to take this   hydrocortisone 2.5 % cream Apply 1 application topically See admin instructions. Apply to affected areas of face, scalp, neck and behind ears once daily.   ketoconazole 2 % shampoo Commonly known as: NIZORAL Apply 1 application topically daily.   ketoconazole 2 % cream Commonly known as: NIZORAL Apply 1 application topically daily. Applied to the face, scalp, neck, and behind the ears   levETIRAcetam 500 MG tablet Commonly known as: KEPPRA Take 1 tablet (500 mg total) by mouth 2 (two) times daily.   levothyroxine 88 MCG tablet Commonly known as: Synthroid Take 1 tablet (88 mcg total) by mouth daily.   Mag-Oxide 200 MG Tabs Generic drug: Magnesium Oxide Take 2 tablets (400 mg total) by mouth in the morning and at bedtime. What changed: how much to take   PARoxetine 20 MG tablet Commonly known as: PAXIL Take 1 tablet (20 mg total) by mouth daily.   QUEtiapine 400 MG tablet Commonly known as: SEROQUEL Take 1 tablet (400 mg total) by mouth at bedtime.   QUEtiapine 50 MG tablet Commonly known as: SEROQUEL Take 1 tablet (50 mg total) by mouth at bedtime.   sennosides-docusate sodium 8.6-50 MG tablet Commonly known as: SENOKOT-S Take 1 tablet by mouth daily as needed for constipation.  tamsulosin 0.4 MG Caps capsule Commonly known as: FLOMAX Take 0.4 mg by mouth at bedtime.

## 2019-06-05 NOTE — Discharge Instructions (Signed)
Discharge Instructions  No restriction in activities, slowly increase your activity back to normal.   Your incision is closed with absorbable sutures. These will naturally fall off over the next 4-6 weeks. If they become bothersome or cause discomfort, apply some antibiotic ointment like bacitracin or neosporin on the sutures. This will soften them up and usually makes them more comfortable while they dissolve.  Okay to shower on the day of discharge. Be gentle when cleaning your incision. Use regular soap and water. If that is uncomfortable, try using baby shampoo. Do not submerge the wound under water for 2 weeks after surgery.  Follow up with Dr. Jais Demir in 2 weeks after discharge. If you do not already have a discharge appointment, please call his office at [phone] to schedule a follow up appointment. If you have any concerns or questions, please call the office and let us know. 

## 2019-06-05 NOTE — TOC Progression Note (Signed)
Transition of Care [facility]) - Progression Note    Patient Details  Name: Ricky Ward MRN: [id number] Date of Birth: 1950-02-25  Transition of Care [facility]) CM/SW [place], [place] Phone Number: 06/05/2019, 3:44 PM  Clinical Narrative:   CSW attempted to reach patient's family again today to discuss bed offers and confirm where they want the patient to go for SNF. CSW left voicemails for patient's sister and daughter, awaiting calls back. CSW also received call from [facility] to complete interview; attempted to call back to discuss and left a voicemail. Barriers to discharge include inability to reach patient's family to confirm placement and [facility] pending.    Expected Discharge Plan: Skilled Nursing Facility Barriers to Discharge: Family Issues, Awaiting State Approval Forensic scientist)  Expected Discharge Plan and Services Expected Discharge Plan: [place] In-house Referral: Clinical Social Work Discharge Planning Services: CM Consult Post Acute Care Choice: [place] arrangements for the past 2 months: [place]                                       Social Determinants of Health (SDOH) Interventions    Readmission Risk Interventions No flowsheet data found.

## 2019-06-06 LAB — TROPONIN I (HIGH SENSITIVITY)
Troponin I (High Sensitivity): 4 ng/L (ref <18)
Troponin I (High Sensitivity): 4 ng/L (ref <18)

## 2019-06-06 NOTE — Progress Notes (Signed)
Overall stable.  No new issues or problems.  Afebrile.  Vital signs are stable.  Awake and alert.  Oriented times person but not time or place.  Pleasantly confused.  Motor examination stable.  Wound with some superficial eschar and some mild erythema but no obvious infection.  Status post craniotomy for subdural hematoma.  Continue supportive efforts.  Awaiting skilled nursing facility placement.

## 2019-06-06 NOTE — TOC Progression Note (Addendum)
Transition of Care [facility]) - Progression Note    Patient Details  Name: Ricky Ward MRN: [id number] Date of Birth: 1950-07-31  [facility]) CM/SW [place], [place] Phone Number: 06/06/2019, 1:04 PM  Clinical Narrative:    1:15p CSW received telephone call from patient's daughter [name]. CSW provided daughter with bed offers. Daughter stated she would like to go over it with patient's sister and agreed to call CSW back with a bed choice.  1:00p CSW made telephone calls to patient's sister and daughter to confirm SNF bed choice. CSW left a voicemail with both, awaiting calls back. CSW will continue to follow.   Expected Discharge Plan: Skilled Nursing Facility Barriers to Discharge: Family Issues, Awaiting State Approval Forensic scientist)  Expected Discharge Plan and Services Expected Discharge Plan: [name] In-house Referral: Clinical Social Work Discharge Planning Services: CM Consult Post Acute Care Choice: [place] arrangements for the past 2 months: [place]                                       Social Determinants of Health (SDOH) Interventions    Readmission Risk Interventions No flowsheet data found.

## 2019-06-07 NOTE — TOC Progression Note (Addendum)
Transition of Care [facility]) - Progression Note    Patient Details  Name: Ricky Ward MRN: [id number] Date of Birth: 1950-05-26  Transition of Care [facility]) CM/SW [place], [place] Phone Number: 06/07/2019, 5:09 PM  Clinical Narrative:    CSW received voicemail from patient's daughter [name]. CSW spoke with daughter who expressed preference for [facility]. She expressed she has not been able to reach patient's sister either to discuss SNF offers. Daughter expressed patient's sister only contacts her on Facebook so she provided her with the bed offers via messenger to get feedback. Patient's sister told daughter she will make contact with CSW on Monday.  Daughter stated she understands patient's sister will make the final decision but requested to be updated on final dispostion if sister is able to be contacted. No further questions at this time.  CSW attempted to make contact with patient's sister. Left voicemail, awaiting a callback.    Expected Discharge Plan: Skilled Nursing Facility Barriers to Discharge: Family Issues, Awaiting State Approval Forensic scientist)  Expected Discharge Plan and Services Expected Discharge Plan: [place] In-house Referral: Clinical Social Work Discharge Planning Services: CM Consult Post Acute Care Choice: [place] arrangements for the past 2 months: [place]                                       Social Determinants of Health (SDOH) Interventions    Readmission Risk Interventions No flowsheet data found.

## 2019-06-07 NOTE — Progress Notes (Signed)
   Providing Compassionate, Quality Care - Together   Subjective: No issues overnight. Nurse reported run of v-tach yesterday afternoon, but no other episodes noted.  Objective: Vital signs in last 24 hours: Temp:  [97.6 F (36.4 C)-98.5 F (36.9 C)] 97.7 F (36.5 C) (04/25 0802) Pulse Rate:  [76-100] 86 (04/25 0802) Resp:  [16-20] 20 (04/25 0802) BP: (105-135)/(61-79) 113/75 (04/25 0802) SpO2:  [94 %-98 %] 95 % (04/25 0802)  Intake/Output from previous day: 04/24 0701 - 04/25 0700 In: 410 [P.O.:410] Out: 600 [Urine:600] Intake/Output this shift: No intake/output data recorded.  Alert, oriented to self, pleasantly confused MAE, motor examination stable PERRLA Speech slurred Surgical wound with superficial eschar, but no signs of infection  Lab Results: No results for input(s): WBC, HGB, HCT, PLT in the last 72 hours. BMET No results for input(s): NA, K, CL, CO2, GLUCOSE, BUN, CREATININE, CALCIUM in the last 72 hours.  Studies/Results: No results found.  Assessment/Plan: Patient is 13 days status post left-sided craniotomy for subdural hematoma evacuation by Dr. Zada Finders.    LOS: 13 days    -Continue supportive efforts -Awaiting SNF placement   Viona Gilmore, DNP, AGNP-C Nurse Practitioner  [facility] [place] [address], [place], [place] [address] P: [phone]    F: [phone]  06/07/2019, 9:36 AM

## 2019-06-08 DIAGNOSIS — R4701 Aphasia: Secondary | ICD-10-CM | POA: Diagnosis not present

## 2019-06-08 DIAGNOSIS — Z7401 Bed confinement status: Secondary | ICD-10-CM | POA: Diagnosis not present

## 2019-06-08 DIAGNOSIS — E46 Unspecified protein-calorie malnutrition: Secondary | ICD-10-CM | POA: Diagnosis not present

## 2019-06-08 DIAGNOSIS — F0390 Unspecified dementia without behavioral disturbance: Secondary | ICD-10-CM | POA: Diagnosis present

## 2019-06-08 DIAGNOSIS — R4182 Altered mental status, unspecified: Secondary | ICD-10-CM | POA: Diagnosis not present

## 2019-06-08 DIAGNOSIS — S065X9A Traumatic subdural hemorrhage with loss of consciousness of unspecified duration, initial encounter: Secondary | ICD-10-CM | POA: Diagnosis not present

## 2019-06-08 DIAGNOSIS — S065X9D Traumatic subdural hemorrhage with loss of consciousness of unspecified duration, subsequent encounter: Secondary | ICD-10-CM | POA: Diagnosis not present

## 2019-06-08 DIAGNOSIS — B951 Streptococcus, group B, as the cause of diseases classified elsewhere: Secondary | ICD-10-CM | POA: Diagnosis present

## 2019-06-08 DIAGNOSIS — I639 Cerebral infarction, unspecified: Secondary | ICD-10-CM | POA: Diagnosis not present

## 2019-06-08 DIAGNOSIS — R1310 Dysphagia, unspecified: Secondary | ICD-10-CM | POA: Diagnosis not present

## 2019-06-08 DIAGNOSIS — R262 Difficulty in walking, not elsewhere classified: Secondary | ICD-10-CM | POA: Diagnosis not present

## 2019-06-08 DIAGNOSIS — G060 Intracranial abscess and granuloma: Secondary | ICD-10-CM | POA: Diagnosis present

## 2019-06-08 DIAGNOSIS — S9032XA Contusion of left foot, initial encounter: Secondary | ICD-10-CM | POA: Diagnosis present

## 2019-06-08 DIAGNOSIS — N3000 Acute cystitis without hematuria: Secondary | ICD-10-CM | POA: Diagnosis not present

## 2019-06-08 DIAGNOSIS — Z515 Encounter for palliative care: Secondary | ICD-10-CM | POA: Diagnosis not present

## 2019-06-08 DIAGNOSIS — D62 Acute posthemorrhagic anemia: Secondary | ICD-10-CM | POA: Diagnosis not present

## 2019-06-08 DIAGNOSIS — L409 Psoriasis, unspecified: Secondary | ICD-10-CM | POA: Diagnosis not present

## 2019-06-08 DIAGNOSIS — I629 Nontraumatic intracranial hemorrhage, unspecified: Secondary | ICD-10-CM | POA: Diagnosis not present

## 2019-06-08 DIAGNOSIS — I69391 Dysphagia following cerebral infarction: Secondary | ICD-10-CM | POA: Diagnosis not present

## 2019-06-08 DIAGNOSIS — G935 Compression of brain: Secondary | ICD-10-CM | POA: Diagnosis not present

## 2019-06-08 DIAGNOSIS — Z1629 Resistance to other single specified antibiotic: Secondary | ICD-10-CM | POA: Diagnosis present

## 2019-06-08 DIAGNOSIS — G9349 Other encephalopathy: Secondary | ICD-10-CM | POA: Diagnosis present

## 2019-06-08 DIAGNOSIS — I69328 Other speech and language deficits following cerebral infarction: Secondary | ICD-10-CM | POA: Diagnosis not present

## 2019-06-08 DIAGNOSIS — R4020 Unspecified coma: Secondary | ICD-10-CM | POA: Diagnosis not present

## 2019-06-08 DIAGNOSIS — B9620 Unspecified Escherichia coli [E. coli] as the cause of diseases classified elsewhere: Secondary | ICD-10-CM | POA: Diagnosis present

## 2019-06-08 DIAGNOSIS — R000 Tachycardia, unspecified: Secondary | ICD-10-CM | POA: Diagnosis not present

## 2019-06-08 DIAGNOSIS — M6281 Muscle weakness (generalized): Secondary | ICD-10-CM | POA: Diagnosis not present

## 2019-06-08 DIAGNOSIS — R6521 Severe sepsis with septic shock: Secondary | ICD-10-CM | POA: Diagnosis not present

## 2019-06-08 DIAGNOSIS — J9601 Acute respiratory failure with hypoxia: Secondary | ICD-10-CM | POA: Diagnosis present

## 2019-06-08 DIAGNOSIS — A419 Sepsis, unspecified organism: Secondary | ICD-10-CM | POA: Diagnosis not present

## 2019-06-08 DIAGNOSIS — I6200 Nontraumatic subdural hemorrhage, unspecified: Secondary | ICD-10-CM | POA: Diagnosis not present

## 2019-06-08 DIAGNOSIS — J9811 Atelectasis: Secondary | ICD-10-CM | POA: Diagnosis not present

## 2019-06-08 DIAGNOSIS — Z66 Do not resuscitate: Secondary | ICD-10-CM | POA: Diagnosis not present

## 2019-06-08 DIAGNOSIS — R404 Transient alteration of awareness: Secondary | ICD-10-CM | POA: Diagnosis not present

## 2019-06-08 DIAGNOSIS — Z9981 Dependence on supplemental oxygen: Secondary | ICD-10-CM | POA: Diagnosis not present

## 2019-06-08 DIAGNOSIS — E039 Hypothyroidism, unspecified: Secondary | ICD-10-CM | POA: Diagnosis not present

## 2019-06-08 DIAGNOSIS — I6201 Nontraumatic acute subdural hemorrhage: Secondary | ICD-10-CM | POA: Diagnosis present

## 2019-06-08 DIAGNOSIS — B957 Other staphylococcus as the cause of diseases classified elsewhere: Secondary | ICD-10-CM | POA: Diagnosis present

## 2019-06-08 DIAGNOSIS — R0902 Hypoxemia: Secondary | ICD-10-CM | POA: Diagnosis not present

## 2019-06-08 DIAGNOSIS — Z20822 Contact with and (suspected) exposure to covid-19: Secondary | ICD-10-CM | POA: Diagnosis present

## 2019-06-08 DIAGNOSIS — Z48811 Encounter for surgical aftercare following surgery on the nervous system: Secondary | ICD-10-CM | POA: Diagnosis not present

## 2019-06-08 DIAGNOSIS — F209 Schizophrenia, unspecified: Secondary | ICD-10-CM | POA: Diagnosis present

## 2019-06-08 DIAGNOSIS — J189 Pneumonia, unspecified organism: Secondary | ICD-10-CM | POA: Diagnosis present

## 2019-06-08 DIAGNOSIS — N390 Urinary tract infection, site not specified: Secondary | ICD-10-CM | POA: Diagnosis present

## 2019-06-08 DIAGNOSIS — R1319 Other dysphagia: Secondary | ICD-10-CM | POA: Diagnosis present

## 2019-06-08 DIAGNOSIS — G459 Transient cerebral ischemic attack, unspecified: Secondary | ICD-10-CM | POA: Diagnosis not present

## 2019-06-08 DIAGNOSIS — I619 Nontraumatic intracerebral hemorrhage, unspecified: Secondary | ICD-10-CM | POA: Diagnosis not present

## 2019-06-08 DIAGNOSIS — R58 Hemorrhage, not elsewhere classified: Secondary | ICD-10-CM | POA: Diagnosis not present

## 2019-06-08 DIAGNOSIS — F3130 Bipolar disorder, current episode depressed, mild or moderate severity, unspecified: Secondary | ICD-10-CM | POA: Diagnosis not present

## 2019-06-08 DIAGNOSIS — Z1611 Resistance to penicillins: Secondary | ICD-10-CM | POA: Diagnosis present

## 2019-06-08 DIAGNOSIS — I6203 Nontraumatic chronic subdural hemorrhage: Secondary | ICD-10-CM | POA: Diagnosis present

## 2019-06-08 DIAGNOSIS — S9031XA Contusion of right foot, initial encounter: Secondary | ICD-10-CM | POA: Diagnosis present

## 2019-06-08 DIAGNOSIS — M2550 Pain in unspecified joint: Secondary | ICD-10-CM | POA: Diagnosis not present

## 2019-06-08 DIAGNOSIS — Z781 Physical restraint status: Secondary | ICD-10-CM | POA: Diagnosis not present

## 2019-06-08 NOTE — Progress Notes (Signed)
Physical Therapy Treatment Patient Details Name: Ricky Ward MRN: [id number] DOB: 1950-10-24 Today's Date: 06/08/2019    History of Present Illness Pt is 69 yo male who presented with L sided weakness after a fall.  Pt found to have large L hemispheric subdural hematoma with rightward midline shift of 7 mm, L finger laceration, and small acute R frontal MCA infarcts.Pt s/p craniotomy hematoma evacuation on 05/25/19.  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  Pt with PMH including bipolar, hypothyroidism, hyperlipidemia.    PT Comments    Patient seen for mobility progression. Pt requires +2 assist for all mobility this session. Continue to progress as tolerated with anticipated d/c to SNF for further skilled PT services.     Follow Up Recommendations  SNF     Equipment Recommendations  Other (comment)(TBD post acute )    Recommendations for Other Services       Precautions / Restrictions Precautions Precautions: Fall Restrictions Weight Bearing Restrictions: No    Mobility  Bed Mobility Overal bed mobility: Needs Assistance Bed Mobility: Sit to Supine;Supine to Sit     Supine to sit: HOB elevated;Max assist Sit to supine: Max assist;+2 for safety/equipment   General bed mobility comments: multimodal cues for sequencing; max A due to poor motor planning   Transfers                 General transfer comment: max A +2 to lateral scoot up to [facility] with bed pad; pt with heavy posterior bias and pt assisting very minimally to sit EOB with resistance noted at times; OOB trasnfer deferred for patient/therapist safety  Ambulation/Gait                 Stairs             Wheelchair Mobility    Modified Rankin (Stroke Patients Only)       Balance Overall balance assessment: Needs assistance Sitting-balance support: Feet supported;Bilateral upper extremity supported Sitting balance-Leahy Scale: Zero   Postural control: Posterior lean                                   Cognition Arousal/Alertness: Awake/alert Behavior During Therapy: Flat affect Overall Cognitive Status: Impaired/Different from baseline                                 General Comments: pt did not verbalize much this session and often times repeating what therapist said; pt following single step cues ~25 % time      Exercises      General Comments        Pertinent Vitals/Pain Pain Assessment: Faces Faces Pain Scale: No hurt    Home Living                      Prior Function            PT Goals (current goals can now be found in the care plan section) Acute Rehab PT Goals Patient Stated Goal: none stated  Progress towards PT goals: Not progressing toward goals - comment    Frequency    Min 3X/week      PT Plan Current plan remains appropriate    Co-evaluation              AM-PAC PT "6 Clicks" Mobility  Outcome Measure  Help needed turning from your back to your side while in a flat bed without using bedrails?: A Lot Help needed moving from lying on your back to sitting on the side of a flat bed without using bedrails?: A Lot Help needed moving to and from a bed to a chair (including a wheelchair)?: A Lot Help needed standing up from a chair using your arms (e.g., wheelchair or bedside chair)?: Total Help needed to walk in hospital room?: Total Help needed climbing 3-5 steps with a railing? : Total 6 Click Score: 9    End of Session Equipment Utilized During Treatment: Gait belt Activity Tolerance: Patient tolerated treatment well Patient left: with call bell/phone within reach;in bed;with bed alarm set;with restraints reapplied;Other (comment)(pt has bilat mittens) Nurse Communication: Mobility status PT Visit Diagnosis: Muscle weakness (generalized) (M62.81);Difficulty in walking, not elsewhere classified (R26.2);Other symptoms and signs involving the nervous system (R29.898)      Time: [id number] PT Time Calculation (min) (ACUTE ONLY): 23 min  Charges:  $Therapeutic Activity: 23-37 mins                     Earney Navy, PTA Acute Rehabilitation Services Pager: [phone] Office: [phone]     Darliss Cheney 06/08/2019, 4:03 PM

## 2019-06-08 NOTE — Progress Notes (Signed)
Neurosurgery Service Progress Note  Subjective: No acute events overnight, no complaints today  Objective: Vitals:   06/07/19 1954 06/07/19 2345 06/08/19 0425 06/08/19 0430  BP: 131/66 110/69 (!) 136/110 (!) 136/110  Pulse: 93 (!) 101 (!) 127 92  Resp: 17 18 18 18   Temp: 98.7 F (37.1 C) 98.2 F (36.8 C) 97.7 F (36.5 C) 98.7 F (37.1 C)  TempSrc: Oral Oral Oral   SpO2: 94%  94% 94%  Weight:      Height:       Temp (24hrs), Avg:98.2 F (36.8 C), Min:97.7 F (36.5 C), Max:98.7 F (37.1 C)  CBC Latest Ref Rng & Units 05/29/2019 05/28/2019 05/25/2019  WBC 4.0 - 10.5 K/uL 8.0 9.6 9.4  Hemoglobin 13.0 - 17.0 g/dL 13.1 13.0 14.6  Hematocrit 39.0 - 52.0 % 39.2 38.5(L) 44.5  Platelets 150 - 400 K/uL 274 241 280   BMP Latest Ref Rng & Units 05/28/2019 05/25/2019 04/24/2019  Glucose 70 - 99 mg/dL 127(H) 162(H) 111(H)  BUN 8 - 23 mg/dL 16 16 12   Creatinine 0.61 - 1.24 mg/dL 1.19 1.15 1.09  BUN/Creat Ratio 6 - 22 (calc) - - -  Sodium 135 - 145 mmol/L 136 142 138  Potassium 3.5 - 5.1 mmol/L 3.7 4.1 4.0  Chloride 98 - 111 mmol/L 98 102 103  CO2 22 - 32 mmol/L 27 28 23   Calcium 8.9 - 10.3 mg/dL 9.0 9.4 8.9    Intake/Output Summary (Last 24 hours) at 06/08/2019 0816 Last data filed at 06/07/2019 1800 Gross per 24 hour  Intake 240 ml  Output 400 ml  Net -160 ml    Current Facility-Administered Medications:  .  acetaminophen (TYLENOL) tablet 650 mg, 650 mg, Oral, Q4H PRN, 650 mg at 06/07/19 1231 **OR** acetaminophen (TYLENOL) suppository 650 mg, 650 mg, Rectal, Q4H PRN, Consuella Lose, MD .  atorvastatin (LIPITOR) tablet 40 mg, 40 mg, Oral, q1800, Consuella Lose, MD, 40 mg at 06/07/19 1721 .  chlorhexidine (PERIDEX) 0.12 % solution 15 mL, 15 mL, Mouth Rinse, BID, Consuella Lose, MD, 15 mL at 06/07/19 2136 .  clotrimazole (LOTRIMIN) 1 % cream, , Topical, BID PRN, Consuella Lose, MD .  dextromethorphan-guaiFENesin ([place] DM) 30-600 MG per 12 hr tablet 1 tablet, 1  tablet, Oral, BID, Consuella Lose, MD, 1 tablet at 06/07/19 2136 .  divalproex (DEPAKOTE SPRINKLE) capsule 250 mg, 250 mg, Oral, QHS, Consuella Lose, MD, 250 mg at 06/07/19 2137 .  docusate sodium (COLACE) capsule 100 mg, 100 mg, Oral, BID, Consuella Lose, MD, 100 mg at 06/07/19 2137 .  dronabinol (MARINOL) capsule 2.5 mg, 2.5 mg, Oral, BID AC, Consuella Lose, MD, 2.5 mg at 06/07/19 1721 .  famotidine (PEPCID) tablet 20 mg, 20 mg, Oral, Daily, Consuella Lose, MD, 20 mg at 06/07/19 0952 .  feeding supplement (ENSURE ENLIVE) (ENSURE ENLIVE) liquid 237 mL, 237 mL, Oral, TID BM, Consuella Lose, MD, 237 mL at 06/07/19 2055 .  heparin injection 5,000 Units, 5,000 Units, Subcutaneous, Q8H, Consuella Lose, MD, 5,000 Units at 06/08/19 [id number] .  HYDROcodone-acetaminophen (NORCO/VICODIN) 5-325 MG per tablet 1 tablet, 1 tablet, Oral, Q4H PRN, Consuella Lose, MD .  hydrocortisone cream 1 % 1 application, 1 application, Topical, Daily, Consuella Lose, MD, 1 application at [id number] .  HYDROmorphone (DILAUDID) injection 0.5 mg, 0.5 mg, Intravenous, Q3H PRN, Consuella Lose, MD, 0.5 mg at 05/27/19 1814 .  ketoconazole (NIZORAL) 2 % cream 1 application, 1 application, Topical, Daily, Consuella Lose, MD, 1 application at [id number] .  labetalol (  NORMODYNE) injection 10-40 mg, 10-40 mg, Intravenous, Q10 min PRN, Consuella Lose, MD, 20 mg at 05/25/19 1659 .  levETIRAcetam (KEPPRA) tablet 500 mg, 500 mg, Oral, BID, Meyran, Ocie Cornfield, NP, 500 mg at 06/07/19 2137 .  levothyroxine (SYNTHROID) tablet 88 mcg, 88 mcg, Oral, Q0600, Consuella Lose, MD, 88 mcg at 06/08/19 0611 .  MEDLINE mouth rinse, 15 mL, Mouth Rinse, q12n4p, Consuella Lose, MD, 15 mL at 06/07/19 1543 .  ondansetron (ZOFRAN) tablet 4 mg, 4 mg, Oral, Q4H PRN **OR** ondansetron (ZOFRAN) injection 4 mg, 4 mg, Intravenous, Q4H PRN, Consuella Lose, MD .  PARoxetine (PAXIL) tablet 20 mg, 20 mg,  Oral, Daily, Consuella Lose, MD, 20 mg at 06/07/19 0952 .  polyethylene glycol (MIRALAX / GLYCOLAX) packet 17 g, 17 g, Oral, Daily PRN, Consuella Lose, MD, 17 g at 06/07/19 1233 .  promethazine (PHENERGAN) tablet 12.5-25 mg, 12.5-25 mg, Oral, Q4H PRN, Consuella Lose, MD .  QUEtiapine (SEROQUEL) tablet 400 mg, 400 mg, Oral, QHS, Consuella Lose, MD, 400 mg at 06/07/19 2137 .  senna-docusate (Senokot-S) tablet 1 tablet, 1 tablet, Oral, Daily PRN, Consuella Lose, MD .  tamsulosin (FLOMAX) capsule 0.4 mg, 0.4 mg, Oral, QHS, Consuella Lose, MD, 0.4 mg at 06/07/19 2138   Physical Exam: Awake/alert, Ox1, interactive, appropriate answers to questions, PERRL with baseline XT, FCx4 with full strength Incision c/d/i  Assessment & Plan: 69 y.o. man s/p craniotomy for 3cm SDH, recovering well. CTH with good evacuation, expected pneumocephalus. Confusion POD2, rpt CTH without significant rehemorrhage, overnight EEG with some L sided sharps but no PLEDs/Sz. CXR w/ b/l effusions s/p lasix w/ improvement in CXR and mental status.  -cont home Rx including valproate, levetiracetam -diet dysphagia puree nectar thick -PT/OT rec SNF, SNF discharge pending -SCDs/TEDs/SQH  Joyice Faster Mirza Kidney  06/08/19 8:16 AM

## 2019-06-08 NOTE — TOC Progression Note (Signed)
Transition of Care [facility]) - Progression Note    Patient Details  Name: Ricky Ward MRN: [id number] Date of Birth: Jan 06, 1951  Transition of Care [facility]) CM/SW [place], [place] Phone Number: 06/08/2019, 2:07 PM  Clinical Narrative:     CSW spoke with patient's sister Narda Rutherford regarding [facility] bed, she is in agreement with patient discharging to [facility] today, CSW informed MD, pending dc summary and orders at this time.   Expected Discharge Plan: Skilled Nursing Facility Barriers to Discharge: Family Issues, Awaiting State Approval Forensic scientist)  Expected Discharge Plan and Services Expected Discharge Plan: [place] In-house Referral: Clinical Social Work Discharge Planning Services: CM Consult Post Acute Care Choice: [place] arrangements for the past 2 months: [place]                                       Social Determinants of Health (SDOH) Interventions    Readmission Risk Interventions No flowsheet data found.

## 2019-06-08 NOTE — TOC Transition Note (Signed)
Transition of Care [facility]) - CM/SW Discharge Note   Patient Details  Name: Ricky Ward MRN: [id number] Date of Birth: 1950/03/12  Transition of Care [facility]) CM/SW Contact:  Alberteen Sam, LCSW Phone Number: 06/08/2019, 2:52 PM   Clinical Narrative:     Patient will DC to: [facility] Anticipated DC date: 06/08/19 Family notified:Janie Transport [id number]  Per MD patient ready for DC to [facility] . RN, patient, patient's family, and facility notified of DC. Discharge Summary sent to facility. RN given number for report [phone]  . DC packet on chart. Ambulance transport requested for patient.  CSW signing off.  [place], [place]   Final next level of care: Skilled Nursing Facility Barriers to Discharge: No Barriers Identified   Patient Goals and CMS Choice Patient states their goals for this hospitalization and ongoing recovery are:: Patient unable to participate in goal planning due to Altered Mental Status. CMS Medicare.gov Compare Post Acute Care list provided to:: Patient Represenative (must comment)(sister Janie) Choice offered to / list presented to : Sibling  Discharge Placement PASRR number recieved: (assessment review completed, will send facility PASRR once obtained. [place] in agreement with this.)            Patient chooses bed at: [facility] Patient to be transferred to facility by: [place] Name of family member notified: Janie Patient and family notified of of transfer: 06/08/19  Discharge Plan and Services In-house Referral: Clinical Social Work Discharge Planning Services: CM Consult Post Acute Care Choice: [place]                               Social Determinants of Health (SDOH) Interventions     Readmission Risk Interventions No flowsheet data found.

## 2019-06-08 NOTE — Progress Notes (Signed)
Discharge instructions called and given to [place] at [facility].

## 2019-06-09 ENCOUNTER — Ambulatory Visit: Payer: Medicare Other | Admitting: Urology

## 2019-06-11 DIAGNOSIS — I639 Cerebral infarction, unspecified: Secondary | ICD-10-CM | POA: Diagnosis not present

## 2019-06-11 DIAGNOSIS — R1310 Dysphagia, unspecified: Secondary | ICD-10-CM | POA: Diagnosis not present

## 2019-06-11 DIAGNOSIS — S065X9A Traumatic subdural hemorrhage with loss of consciousness of unspecified duration, initial encounter: Secondary | ICD-10-CM | POA: Diagnosis not present

## 2019-06-15 DIAGNOSIS — L409 Psoriasis, unspecified: Secondary | ICD-10-CM | POA: Diagnosis not present

## 2019-06-15 DIAGNOSIS — S065X9A Traumatic subdural hemorrhage with loss of consciousness of unspecified duration, initial encounter: Secondary | ICD-10-CM | POA: Diagnosis not present

## 2019-06-15 DIAGNOSIS — E039 Hypothyroidism, unspecified: Secondary | ICD-10-CM | POA: Diagnosis not present

## 2019-06-15 DIAGNOSIS — I639 Cerebral infarction, unspecified: Secondary | ICD-10-CM | POA: Diagnosis not present

## 2019-06-18 DIAGNOSIS — S065X9A Traumatic subdural hemorrhage with loss of consciousness of unspecified duration, initial encounter: Secondary | ICD-10-CM | POA: Diagnosis not present

## 2019-06-18 DIAGNOSIS — R1310 Dysphagia, unspecified: Secondary | ICD-10-CM | POA: Diagnosis not present

## 2019-06-18 DIAGNOSIS — I639 Cerebral infarction, unspecified: Secondary | ICD-10-CM | POA: Diagnosis not present

## 2019-06-25 ENCOUNTER — Other Ambulatory Visit: Payer: Self-pay | Admitting: *Deleted

## 2019-06-25 DIAGNOSIS — S065X9A Traumatic subdural hemorrhage with loss of consciousness of unspecified duration, initial encounter: Secondary | ICD-10-CM | POA: Diagnosis not present

## 2019-06-25 DIAGNOSIS — I639 Cerebral infarction, unspecified: Secondary | ICD-10-CM | POA: Diagnosis not present

## 2019-06-25 DIAGNOSIS — R1310 Dysphagia, unspecified: Secondary | ICD-10-CM | POA: Diagnosis not present

## 2019-06-25 NOTE — Patient Outreach (Signed)
Screened for potential [facility] Care Management needs as a benefit of  [employer] Medicare.  Member is receiving skilled therapy at [facility].   Writer attended telephonic interdisciplinary team meeting to assess for disposition needs and transition plan for resident.   Facility reports member will transition to long term care. Member remains max assist with therapy.  No identifiable THN needs at this time.   Marthenia Rolling, MSN-Ed, RN,BSN [place] Acute Care Coordinator [phone] [facility]) [phone]  (Toll free office)

## 2019-06-30 DIAGNOSIS — Z1629 Resistance to other single specified antibiotic: Secondary | ICD-10-CM | POA: Diagnosis present

## 2019-06-30 DIAGNOSIS — R279 Unspecified lack of coordination: Secondary | ICD-10-CM | POA: Diagnosis not present

## 2019-06-30 DIAGNOSIS — I6203 Nontraumatic chronic subdural hemorrhage: Secondary | ICD-10-CM | POA: Diagnosis present

## 2019-06-30 DIAGNOSIS — N3000 Acute cystitis without hematuria: Secondary | ICD-10-CM | POA: Diagnosis not present

## 2019-06-30 DIAGNOSIS — Z515 Encounter for palliative care: Secondary | ICD-10-CM | POA: Diagnosis not present

## 2019-06-30 DIAGNOSIS — A419 Sepsis, unspecified organism: Secondary | ICD-10-CM | POA: Diagnosis not present

## 2019-06-30 DIAGNOSIS — B951 Streptococcus, group B, as the cause of diseases classified elsewhere: Secondary | ICD-10-CM | POA: Diagnosis present

## 2019-06-30 DIAGNOSIS — Z743 Need for continuous supervision: Secondary | ICD-10-CM | POA: Diagnosis not present

## 2019-06-30 DIAGNOSIS — Z20822 Contact with and (suspected) exposure to covid-19: Secondary | ICD-10-CM | POA: Diagnosis present

## 2019-06-30 DIAGNOSIS — R0902 Hypoxemia: Secondary | ICD-10-CM | POA: Diagnosis not present

## 2019-06-30 DIAGNOSIS — I6201 Nontraumatic acute subdural hemorrhage: Secondary | ICD-10-CM | POA: Diagnosis present

## 2019-06-30 DIAGNOSIS — G060 Intracranial abscess and granuloma: Secondary | ICD-10-CM | POA: Diagnosis not present

## 2019-06-30 DIAGNOSIS — I629 Nontraumatic intracranial hemorrhage, unspecified: Secondary | ICD-10-CM | POA: Diagnosis not present

## 2019-06-30 DIAGNOSIS — S9031XA Contusion of right foot, initial encounter: Secondary | ICD-10-CM | POA: Diagnosis present

## 2019-06-30 DIAGNOSIS — Z66 Do not resuscitate: Secondary | ICD-10-CM | POA: Diagnosis not present

## 2019-06-30 DIAGNOSIS — J189 Pneumonia, unspecified organism: Secondary | ICD-10-CM | POA: Diagnosis present

## 2019-06-30 DIAGNOSIS — Z781 Physical restraint status: Secondary | ICD-10-CM | POA: Diagnosis not present

## 2019-06-30 DIAGNOSIS — Z1611 Resistance to penicillins: Secondary | ICD-10-CM | POA: Diagnosis present

## 2019-06-30 DIAGNOSIS — S065X9S Traumatic subdural hemorrhage with loss of consciousness of unspecified duration, sequela: Secondary | ICD-10-CM | POA: Diagnosis not present

## 2019-06-30 DIAGNOSIS — R4701 Aphasia: Secondary | ICD-10-CM | POA: Diagnosis not present

## 2019-06-30 DIAGNOSIS — S9032XA Contusion of left foot, initial encounter: Secondary | ICD-10-CM | POA: Diagnosis present

## 2019-06-30 DIAGNOSIS — R000 Tachycardia, unspecified: Secondary | ICD-10-CM | POA: Diagnosis not present

## 2019-06-30 DIAGNOSIS — G9340 Encephalopathy, unspecified: Secondary | ICD-10-CM | POA: Diagnosis not present

## 2019-06-30 DIAGNOSIS — I619 Nontraumatic intracerebral hemorrhage, unspecified: Secondary | ICD-10-CM | POA: Diagnosis not present

## 2019-06-30 DIAGNOSIS — F209 Schizophrenia, unspecified: Secondary | ICD-10-CM | POA: Diagnosis present

## 2019-06-30 DIAGNOSIS — E039 Hypothyroidism, unspecified: Secondary | ICD-10-CM | POA: Diagnosis not present

## 2019-06-30 DIAGNOSIS — B9620 Unspecified Escherichia coli [E. coli] as the cause of diseases classified elsewhere: Secondary | ICD-10-CM | POA: Diagnosis present

## 2019-06-30 DIAGNOSIS — S065X0A Traumatic subdural hemorrhage without loss of consciousness, initial encounter: Secondary | ICD-10-CM | POA: Diagnosis not present

## 2019-06-30 DIAGNOSIS — S065X9A Traumatic subdural hemorrhage with loss of consciousness of unspecified duration, initial encounter: Secondary | ICD-10-CM | POA: Diagnosis not present

## 2019-06-30 DIAGNOSIS — R0689 Other abnormalities of breathing: Secondary | ICD-10-CM | POA: Diagnosis not present

## 2019-06-30 DIAGNOSIS — R52 Pain, unspecified: Secondary | ICD-10-CM | POA: Diagnosis not present

## 2019-06-30 DIAGNOSIS — R4182 Altered mental status, unspecified: Secondary | ICD-10-CM | POA: Diagnosis not present

## 2019-06-30 DIAGNOSIS — D62 Acute posthemorrhagic anemia: Secondary | ICD-10-CM | POA: Diagnosis not present

## 2019-06-30 DIAGNOSIS — R4020 Unspecified coma: Secondary | ICD-10-CM | POA: Diagnosis not present

## 2019-06-30 DIAGNOSIS — R9401 Abnormal electroencephalogram [EEG]: Secondary | ICD-10-CM | POA: Diagnosis not present

## 2019-06-30 DIAGNOSIS — G935 Compression of brain: Secondary | ICD-10-CM | POA: Diagnosis not present

## 2019-06-30 DIAGNOSIS — J9811 Atelectasis: Secondary | ICD-10-CM | POA: Diagnosis not present

## 2019-06-30 DIAGNOSIS — F0390 Unspecified dementia without behavioral disturbance: Secondary | ICD-10-CM | POA: Diagnosis present

## 2019-06-30 DIAGNOSIS — F319 Bipolar disorder, unspecified: Secondary | ICD-10-CM | POA: Diagnosis not present

## 2019-06-30 DIAGNOSIS — I69318 Other symptoms and signs involving cognitive functions following cerebral infarction: Secondary | ICD-10-CM | POA: Diagnosis not present

## 2019-06-30 DIAGNOSIS — R1319 Other dysphagia: Secondary | ICD-10-CM | POA: Diagnosis present

## 2019-06-30 DIAGNOSIS — Z4659 Encounter for fitting and adjustment of other gastrointestinal appliance and device: Secondary | ICD-10-CM | POA: Diagnosis not present

## 2019-06-30 DIAGNOSIS — Z9981 Dependence on supplemental oxygen: Secondary | ICD-10-CM | POA: Diagnosis not present

## 2019-06-30 DIAGNOSIS — G9349 Other encephalopathy: Secondary | ICD-10-CM | POA: Diagnosis present

## 2019-06-30 DIAGNOSIS — R1310 Dysphagia, unspecified: Secondary | ICD-10-CM | POA: Diagnosis not present

## 2019-06-30 DIAGNOSIS — R6521 Severe sepsis with septic shock: Secondary | ICD-10-CM | POA: Diagnosis not present

## 2019-06-30 DIAGNOSIS — R404 Transient alteration of awareness: Secondary | ICD-10-CM | POA: Diagnosis not present

## 2019-06-30 DIAGNOSIS — Z9889 Other specified postprocedural states: Secondary | ICD-10-CM | POA: Diagnosis not present

## 2019-06-30 DIAGNOSIS — J9601 Acute respiratory failure with hypoxia: Secondary | ICD-10-CM | POA: Diagnosis present

## 2019-06-30 DIAGNOSIS — B957 Other staphylococcus as the cause of diseases classified elsewhere: Secondary | ICD-10-CM | POA: Diagnosis present

## 2019-06-30 DIAGNOSIS — I6200 Nontraumatic subdural hemorrhage, unspecified: Secondary | ICD-10-CM | POA: Diagnosis not present

## 2019-06-30 DIAGNOSIS — N390 Urinary tract infection, site not specified: Secondary | ICD-10-CM | POA: Diagnosis present

## 2019-06-30 DIAGNOSIS — R58 Hemorrhage, not elsewhere classified: Secondary | ICD-10-CM | POA: Diagnosis not present

## 2019-06-30 DIAGNOSIS — I959 Hypotension, unspecified: Secondary | ICD-10-CM | POA: Diagnosis not present

## 2019-08-13 DEATH — deceased

## 2019-11-27 IMAGING — DX DG CHEST 2V
2 series · 2 of 2 positions shown · non-contrast
Comparison: 05/25/2014

CLINICAL DATA: Productive cough and fever since last night

EXAM:
CHEST  2 VIEW

[chest pa]
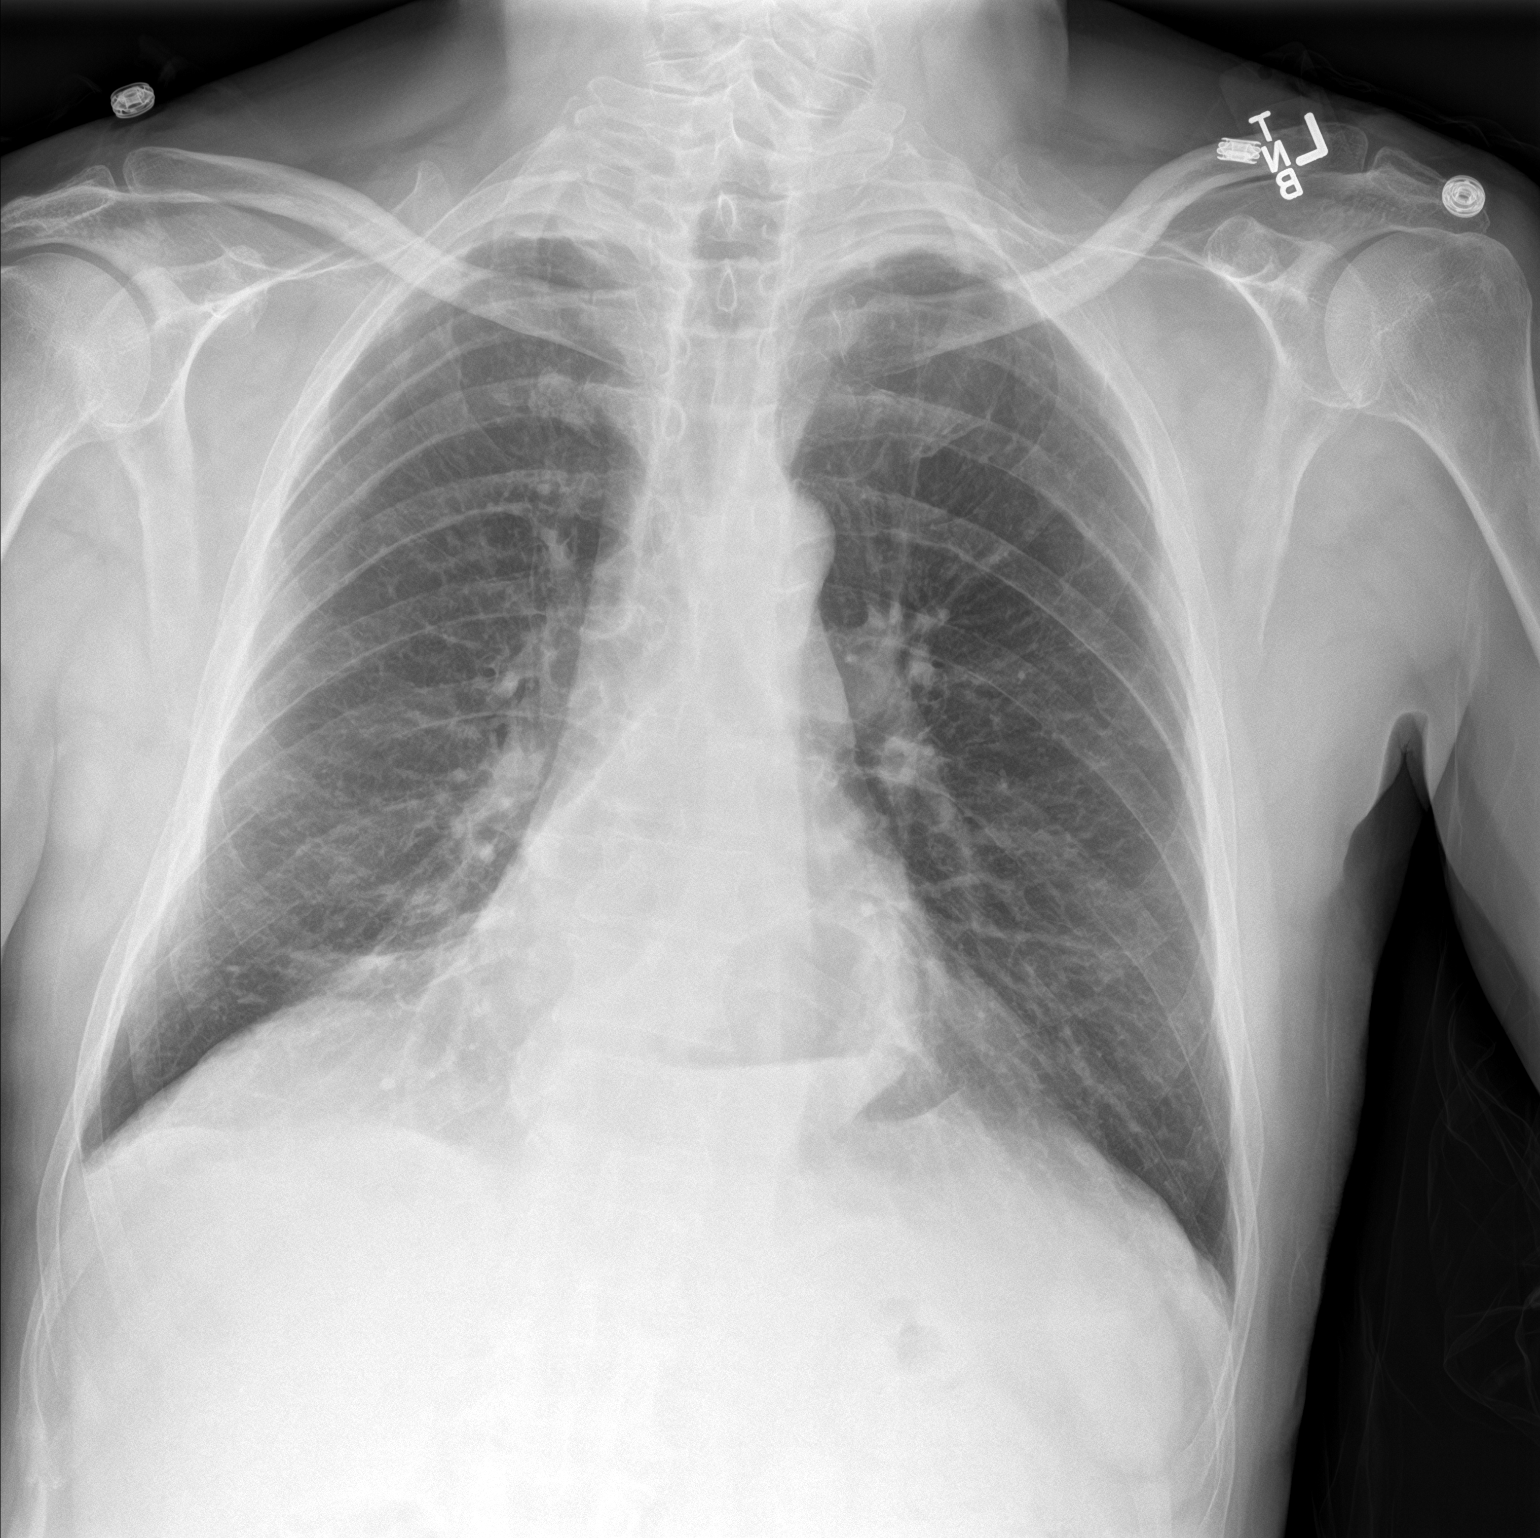

[chest lat]
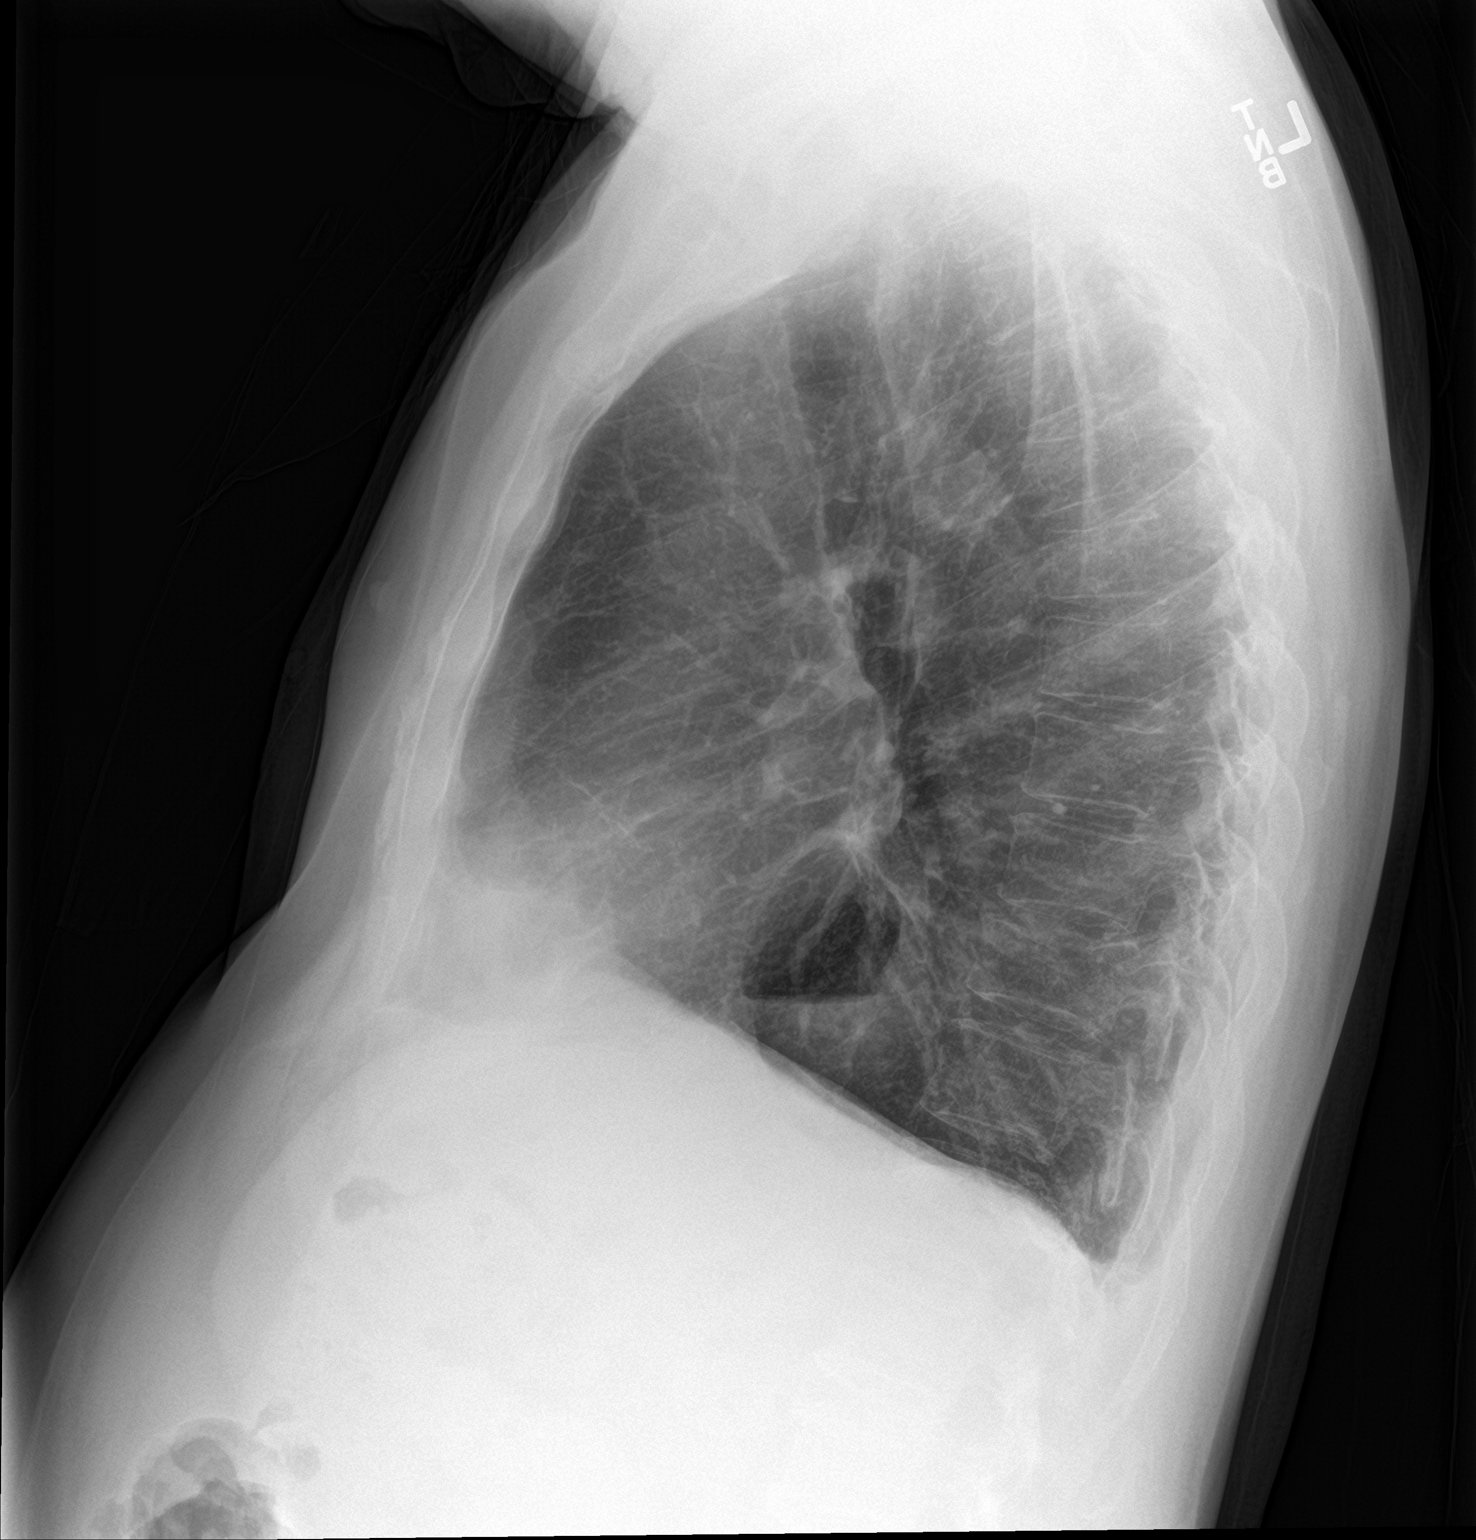

[2 of 2 positions shown; findings below may reference images not displayed]

FINDINGS: Upper normal heart size.

Moderate to large hiatal hernia.

Atherosclerotic calcification aorta.

Pulmonary vascularity normal.

Bronchitic changes with RIGHT middle lobe infiltrate consistent with
pneumonia.

Remaining lungs clear.

No pleural effusion or pneumothorax.

Bones demineralized.
IMPRESSION: Moderate to large hiatal hernia.

Bronchitic changes with RIGHT middle lobe pneumonia.

## 2022-01-17 IMAGING — DX DG CHEST 1V PORT
1 series · 1 of 1 positions shown · non-contrast
Comparison: Chest x-ray 02/06/2019.

CLINICAL DATA: 68-year-old male with history of productive cough
for the past 2 days.

EXAM:
PORTABLE CHEST 1 VIEW

[chest ap]
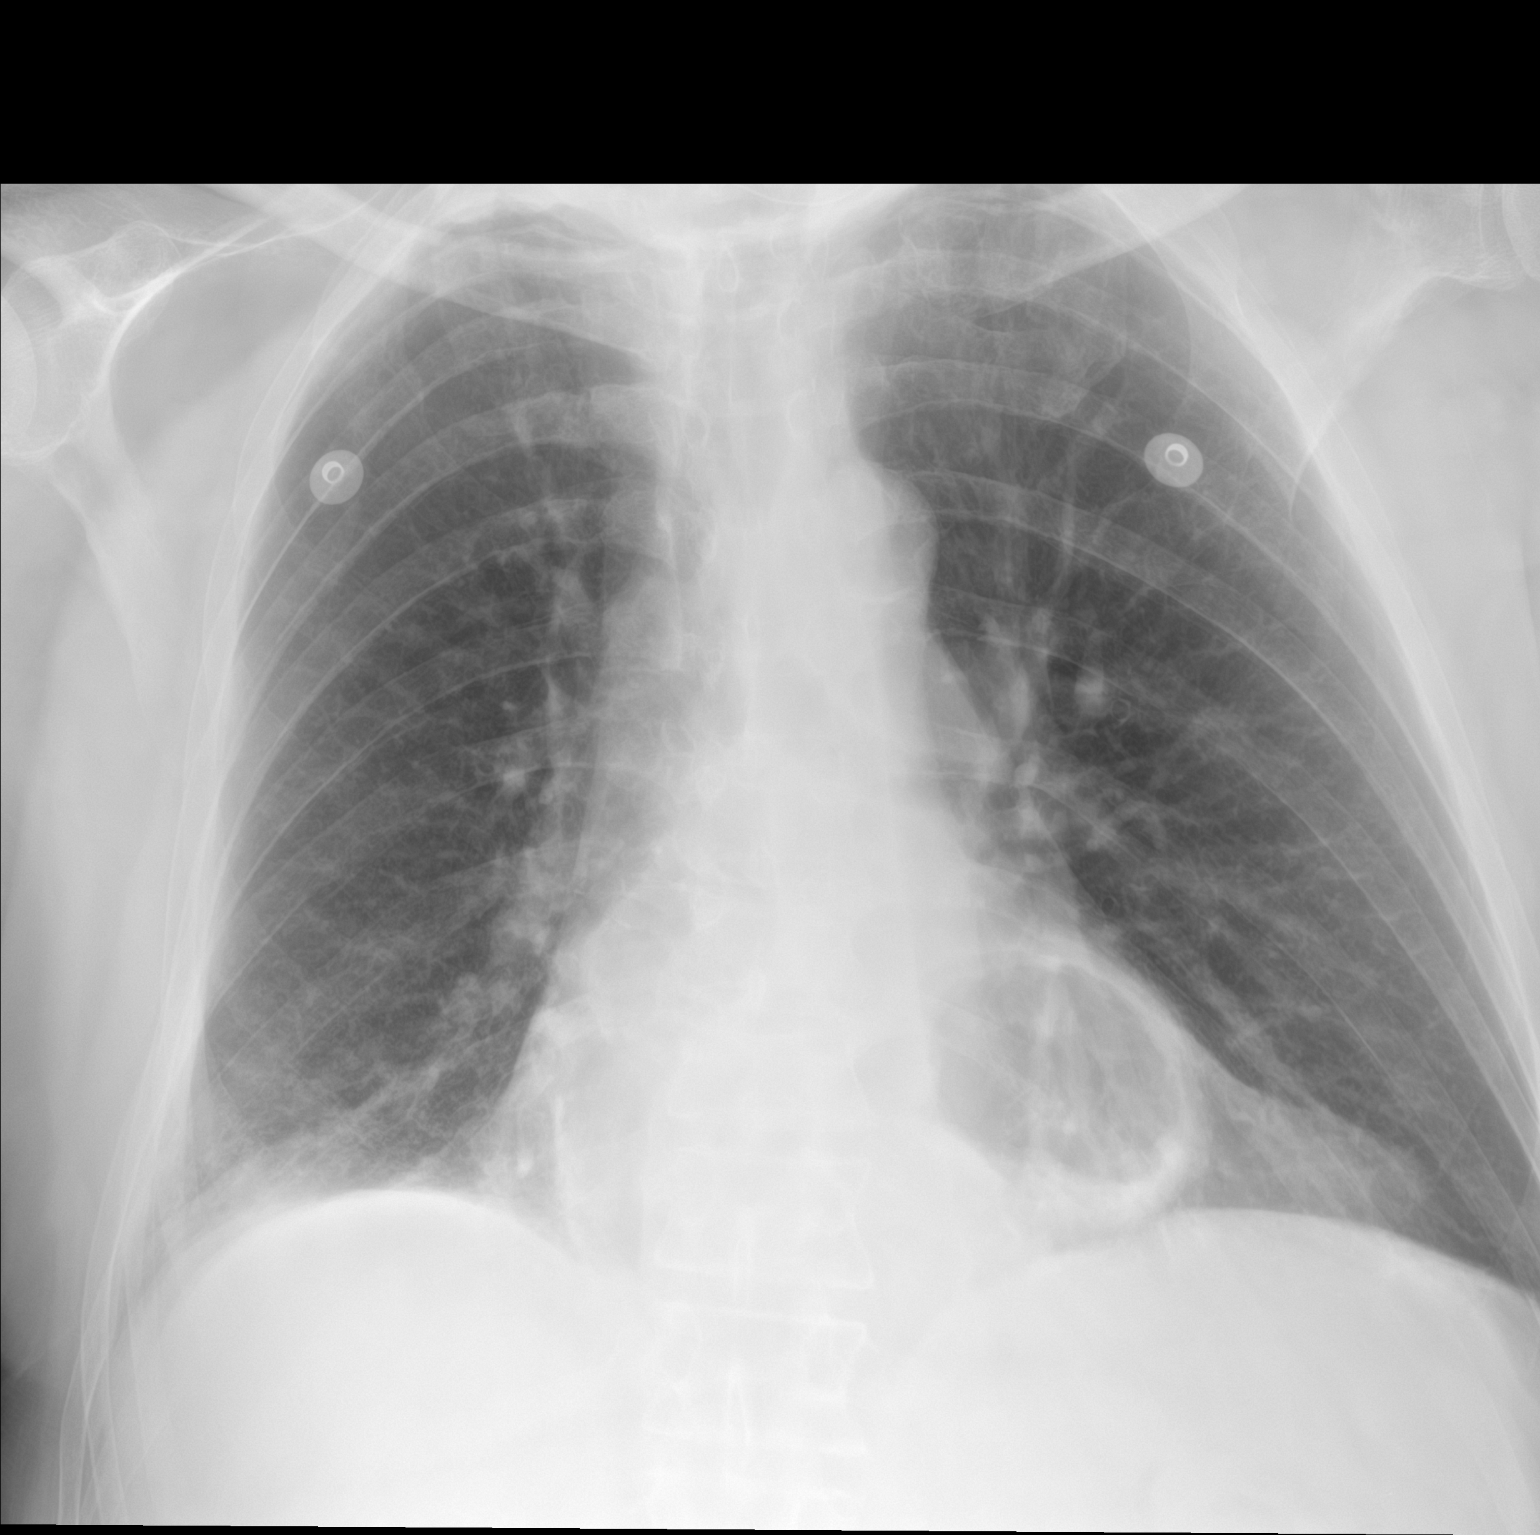

[1 of 1 positions shown; findings below may reference images not displayed]

FINDINGS: Lung volumes are normal. No consolidative airspace disease. No
pleural effusions. No evidence of pulmonary edema. Heart size is
normal. Large hiatal hernia. Upper mediastinal contours are within
normal limits. Aortic atherosclerosis.
IMPRESSION: 1. No radiographic evidence of acute cardiopulmonary disease.
2. Large hiatal hernia.
3. Aortic atherosclerosis.

## 2022-03-12 IMAGING — DX DG CHEST 2V
2 series · 2 of 2 positions shown · non-contrast
Comparison: 05/25/2019 and earlier.

CLINICAL DATA: 68-year-old presenting with acute mental status
changes and acute onset of cough.

EXAM:
CHEST - 2 VIEW

[chest lat]
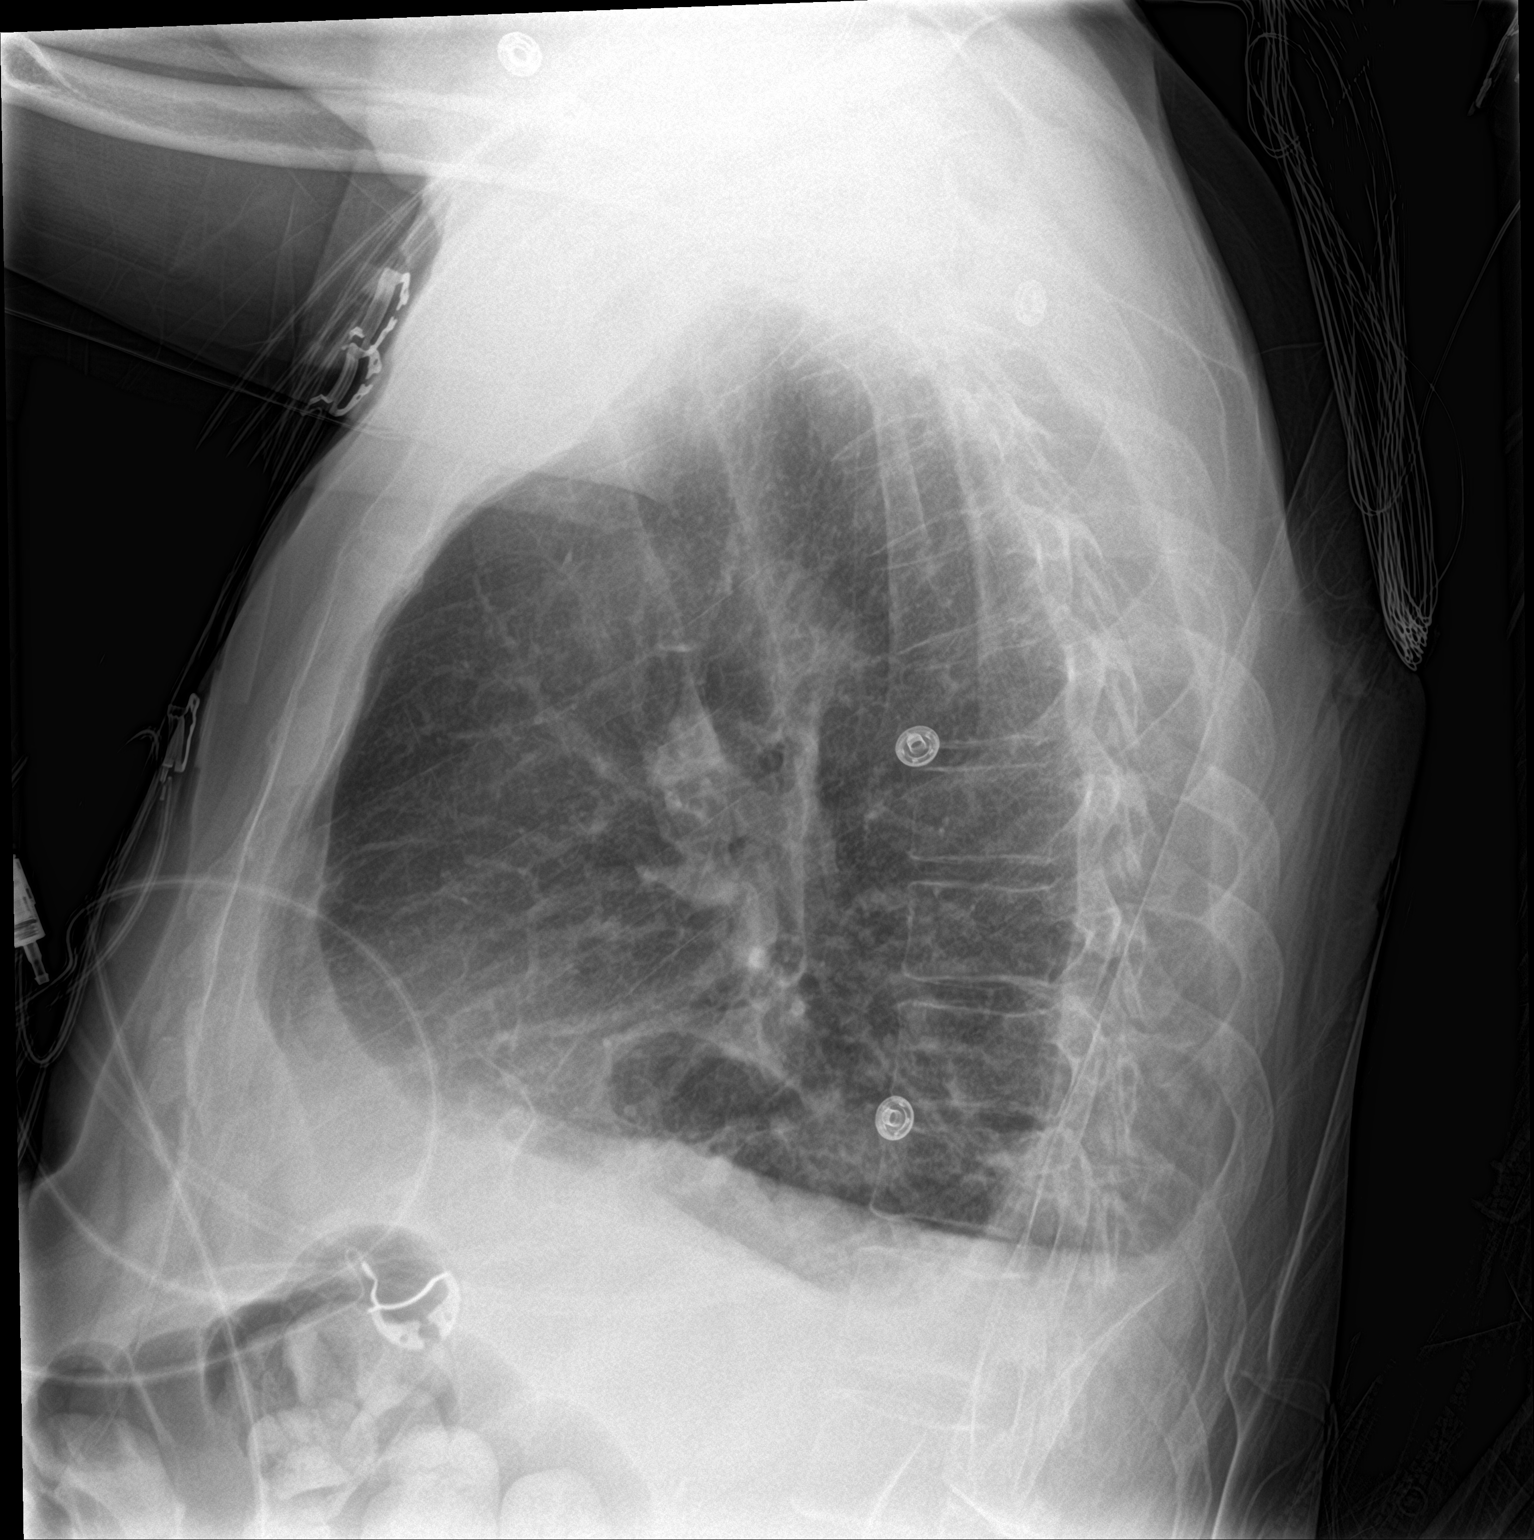

[chest ap]
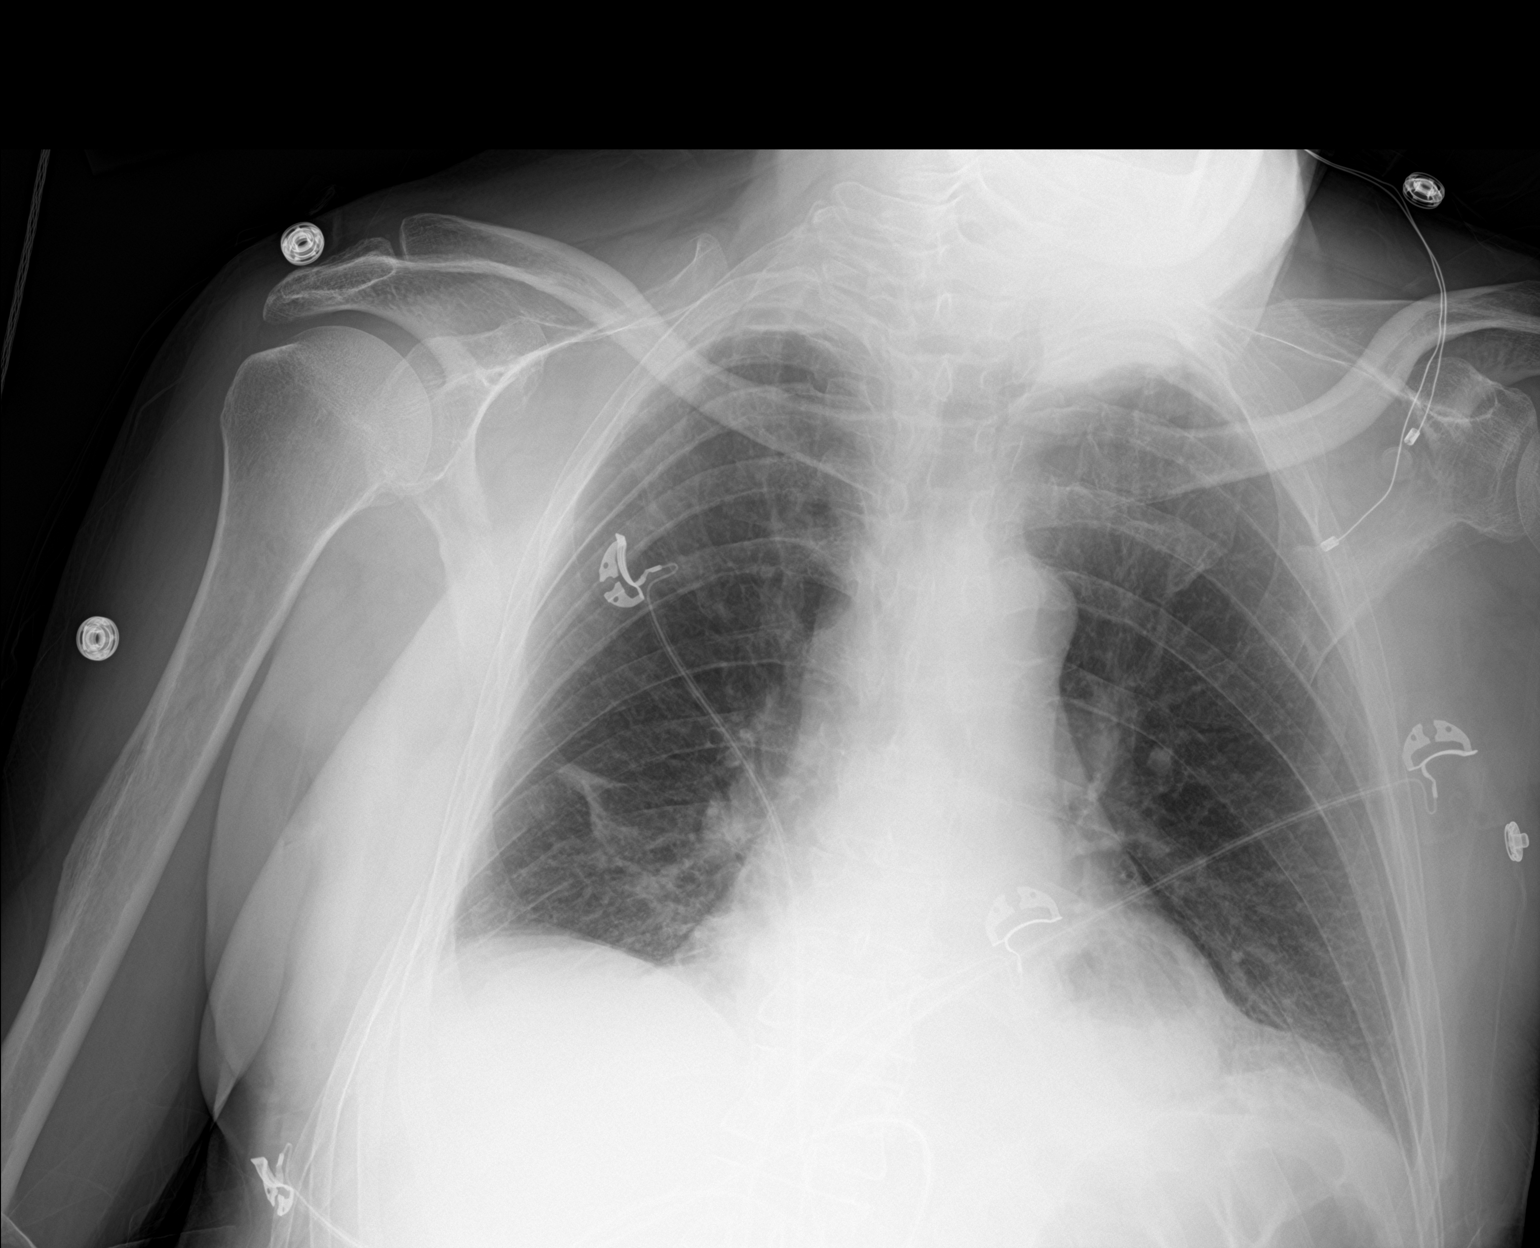

[2 of 2 positions shown; findings below may reference images not displayed]

FINDINGS: Cardiac silhouette normal in size, unchanged. Large hiatal hernia.
Mild aortic atherosclerosis, unchanged. Hilar and mediastinal
contours otherwise unremarkable. Moderate-sized BILATERAL pleural
effusions, best seen on the LATERAL image, with associated
consolidation in the lower lobes. Fluid is present in the minor
fissure on the RIGHT, mimicking a lung nodule.
IMPRESSION: 1. Moderate-sized BILATERAL pleural effusions and associated passive
atelectasis and/or pneumonia involving the lower lobes.
2. Large hiatal hernia.
